# Patient Record
Sex: Male | Born: 1965 | Race: White | Hispanic: No | Smoking: Former smoker
Health system: Southern US, Community
[De-identification: ages and names within clinical notes are randomized; demographics above are authoritative.]

## PROBLEM LIST (undated history)

## (undated) DIAGNOSIS — T1491XA Suicide attempt, initial encounter: Secondary | ICD-10-CM

## (undated) DIAGNOSIS — F250 Schizoaffective disorder, bipolar type: Secondary | ICD-10-CM

## (undated) DIAGNOSIS — F329 Major depressive disorder, single episode, unspecified: Secondary | ICD-10-CM

## (undated) DIAGNOSIS — F1424 Cocaine dependence with cocaine-induced mood disorder: Secondary | ICD-10-CM

## (undated) DIAGNOSIS — F332 Major depressive disorder, recurrent severe without psychotic features: Secondary | ICD-10-CM

## (undated) DIAGNOSIS — F32A Depression, unspecified: Secondary | ICD-10-CM

---

## 2006-06-01 ENCOUNTER — Emergency Department (HOSPITAL_COMMUNITY): Admission: EM | Admit: 2006-06-01 | Discharge: 2006-06-01 | Payer: Self-pay | Admitting: Emergency Medicine

## 2006-06-01 ENCOUNTER — Ambulatory Visit: Payer: Self-pay | Admitting: Psychiatry

## 2006-06-01 ENCOUNTER — Inpatient Hospital Stay (HOSPITAL_COMMUNITY): Admission: AD | Admit: 2006-06-01 | Discharge: 2006-06-03 | Payer: Self-pay | Admitting: Psychiatry

## 2006-08-30 ENCOUNTER — Inpatient Hospital Stay (HOSPITAL_COMMUNITY): Admission: AD | Admit: 2006-08-30 | Discharge: 2006-09-04 | Payer: Self-pay | Admitting: Psychiatry

## 2006-08-30 ENCOUNTER — Ambulatory Visit: Payer: Self-pay | Admitting: Psychiatry

## 2006-10-28 ENCOUNTER — Ambulatory Visit: Payer: Self-pay | Admitting: Psychiatry

## 2006-10-29 ENCOUNTER — Inpatient Hospital Stay (HOSPITAL_COMMUNITY): Admission: RE | Admit: 2006-10-29 | Discharge: 2006-11-01 | Payer: Self-pay | Admitting: Psychiatry

## 2006-12-03 ENCOUNTER — Ambulatory Visit: Payer: Self-pay | Admitting: Psychiatry

## 2006-12-04 ENCOUNTER — Inpatient Hospital Stay (HOSPITAL_COMMUNITY): Admission: RE | Admit: 2006-12-04 | Discharge: 2006-12-06 | Payer: Self-pay | Admitting: Psychiatry

## 2007-04-05 ENCOUNTER — Ambulatory Visit: Payer: Self-pay | Admitting: *Deleted

## 2007-04-05 ENCOUNTER — Inpatient Hospital Stay (HOSPITAL_COMMUNITY): Admission: AD | Admit: 2007-04-05 | Discharge: 2007-04-06 | Payer: Self-pay | Admitting: *Deleted

## 2007-09-26 ENCOUNTER — Inpatient Hospital Stay (HOSPITAL_COMMUNITY): Admission: AD | Admit: 2007-09-26 | Discharge: 2007-09-30 | Payer: Self-pay | Admitting: *Deleted

## 2007-09-26 ENCOUNTER — Ambulatory Visit: Payer: Self-pay | Admitting: *Deleted

## 2008-01-30 ENCOUNTER — Emergency Department (HOSPITAL_COMMUNITY): Admission: EM | Admit: 2008-01-30 | Discharge: 2008-01-30 | Payer: Self-pay | Admitting: Emergency Medicine

## 2008-03-04 ENCOUNTER — Emergency Department (HOSPITAL_COMMUNITY): Admission: AC | Admit: 2008-03-04 | Discharge: 2008-03-04 | Payer: Self-pay

## 2008-03-05 ENCOUNTER — Ambulatory Visit: Payer: Self-pay | Admitting: Psychiatry

## 2008-03-05 ENCOUNTER — Inpatient Hospital Stay (HOSPITAL_COMMUNITY): Admission: AD | Admit: 2008-03-05 | Discharge: 2008-03-07 | Payer: Self-pay | Admitting: Psychiatry

## 2008-03-09 ENCOUNTER — Emergency Department (HOSPITAL_COMMUNITY): Admission: EM | Admit: 2008-03-09 | Discharge: 2008-03-09 | Payer: Self-pay | Admitting: Emergency Medicine

## 2008-11-14 ENCOUNTER — Emergency Department (HOSPITAL_COMMUNITY): Admission: EM | Admit: 2008-11-14 | Discharge: 2008-11-14 | Payer: Self-pay | Admitting: Emergency Medicine

## 2009-01-16 ENCOUNTER — Ambulatory Visit: Payer: Self-pay | Admitting: *Deleted

## 2009-01-16 ENCOUNTER — Emergency Department (HOSPITAL_COMMUNITY): Admission: EM | Admit: 2009-01-16 | Discharge: 2009-01-17 | Payer: Self-pay | Admitting: Emergency Medicine

## 2009-01-17 ENCOUNTER — Inpatient Hospital Stay (HOSPITAL_COMMUNITY): Admission: RE | Admit: 2009-01-17 | Discharge: 2009-01-21 | Payer: Self-pay | Admitting: *Deleted

## 2009-08-14 ENCOUNTER — Emergency Department (HOSPITAL_COMMUNITY): Admission: EM | Admit: 2009-08-14 | Discharge: 2009-08-14 | Payer: Self-pay | Admitting: Emergency Medicine

## 2009-08-22 ENCOUNTER — Emergency Department (HOSPITAL_COMMUNITY): Admission: EM | Admit: 2009-08-22 | Discharge: 2009-08-22 | Payer: Self-pay | Admitting: Emergency Medicine

## 2009-08-28 ENCOUNTER — Emergency Department (HOSPITAL_COMMUNITY): Admission: EM | Admit: 2009-08-28 | Discharge: 2009-08-29 | Payer: Self-pay | Admitting: Emergency Medicine

## 2009-08-29 ENCOUNTER — Ambulatory Visit: Payer: Self-pay | Admitting: Psychiatry

## 2009-08-29 ENCOUNTER — Inpatient Hospital Stay (HOSPITAL_COMMUNITY): Admission: RE | Admit: 2009-08-29 | Discharge: 2009-08-31 | Payer: Self-pay | Admitting: Psychiatry

## 2010-03-17 ENCOUNTER — Emergency Department (HOSPITAL_COMMUNITY): Admission: EM | Admit: 2010-03-17 | Discharge: 2010-03-17 | Payer: Self-pay | Admitting: Emergency Medicine

## 2010-07-04 IMAGING — CR DG CHEST 2V
2 series · 2 of 2 positions shown · non-contrast
Comparison: None

CLINICAL DATA: Fever, cough

CHEST - 2 VIEW

[w chest pa]
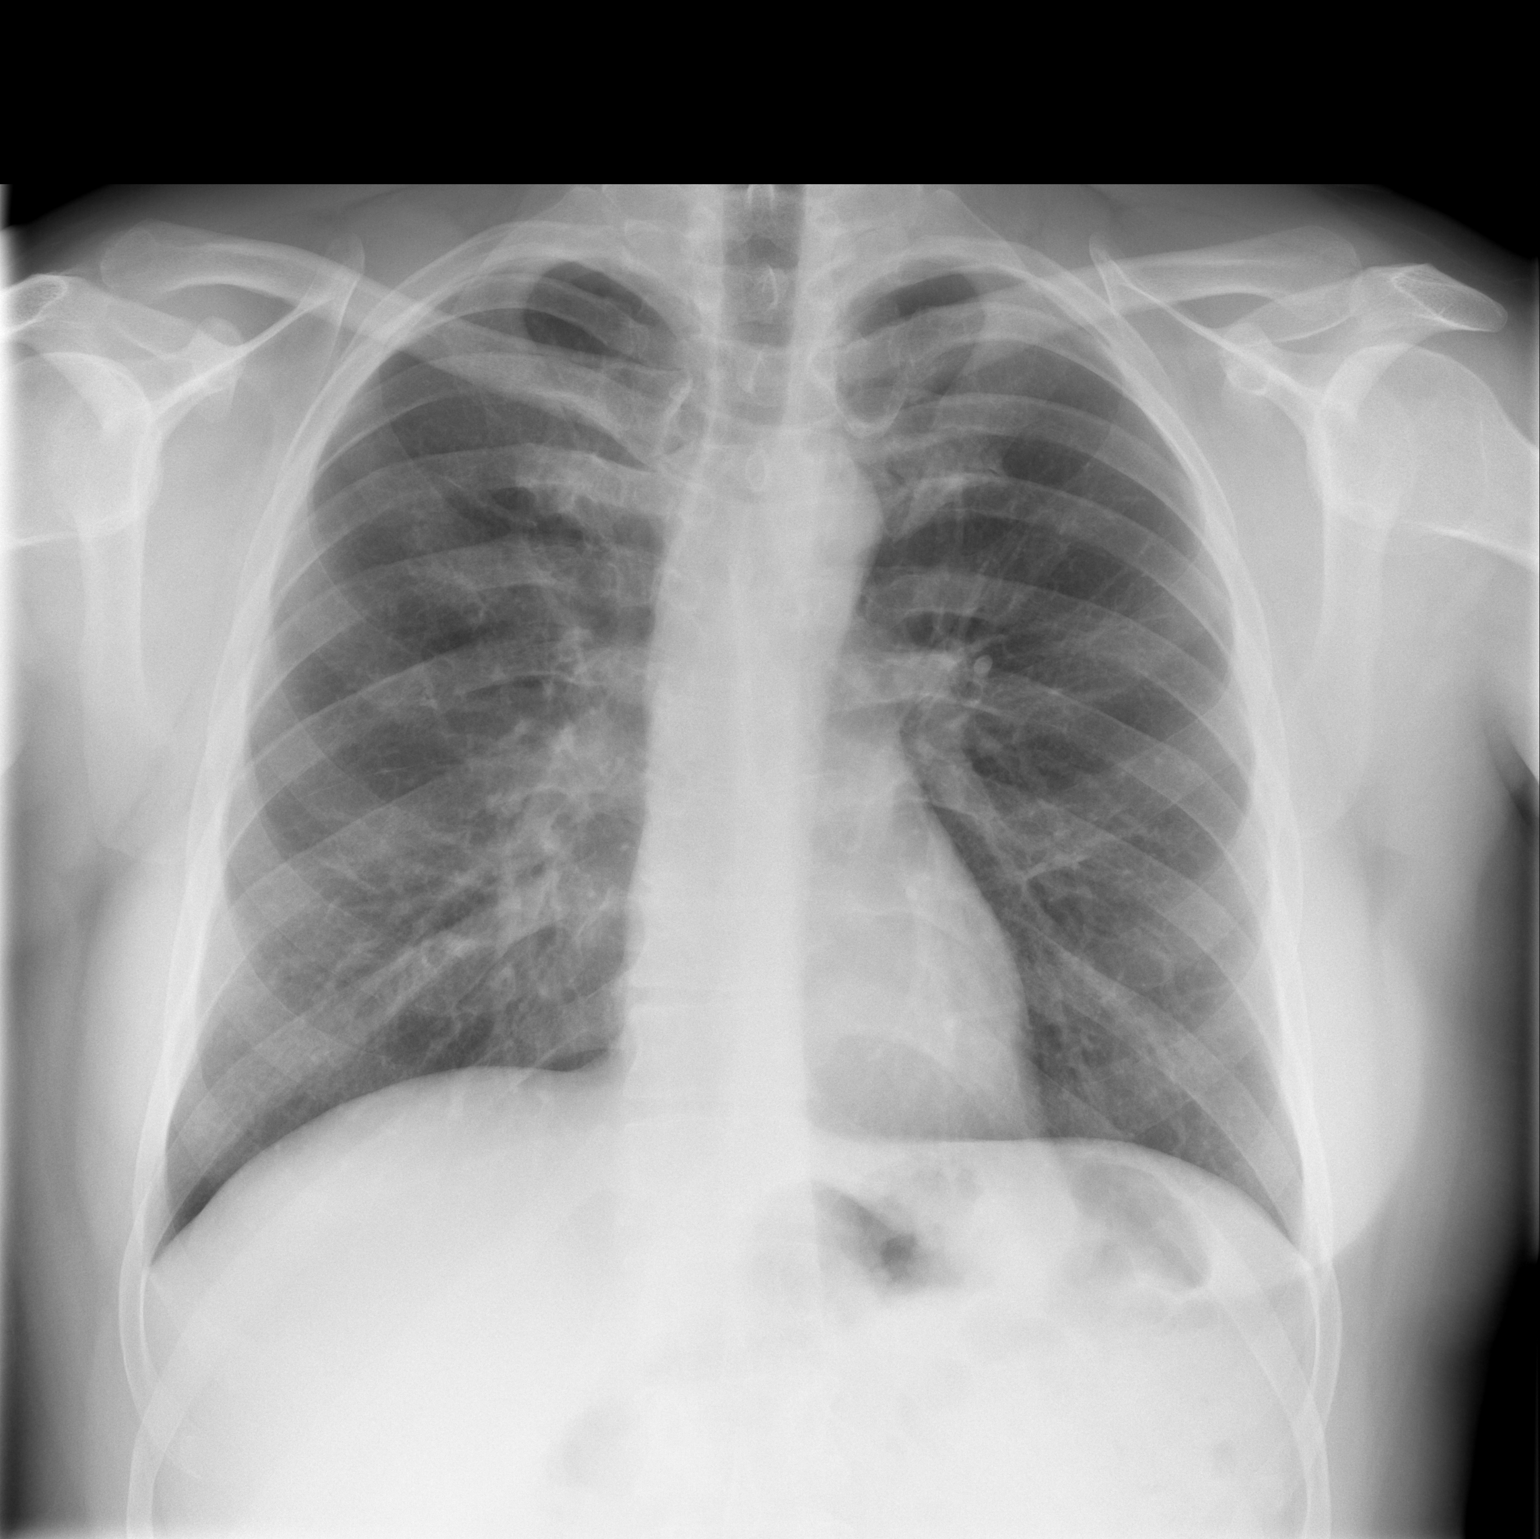

[w chest lat]
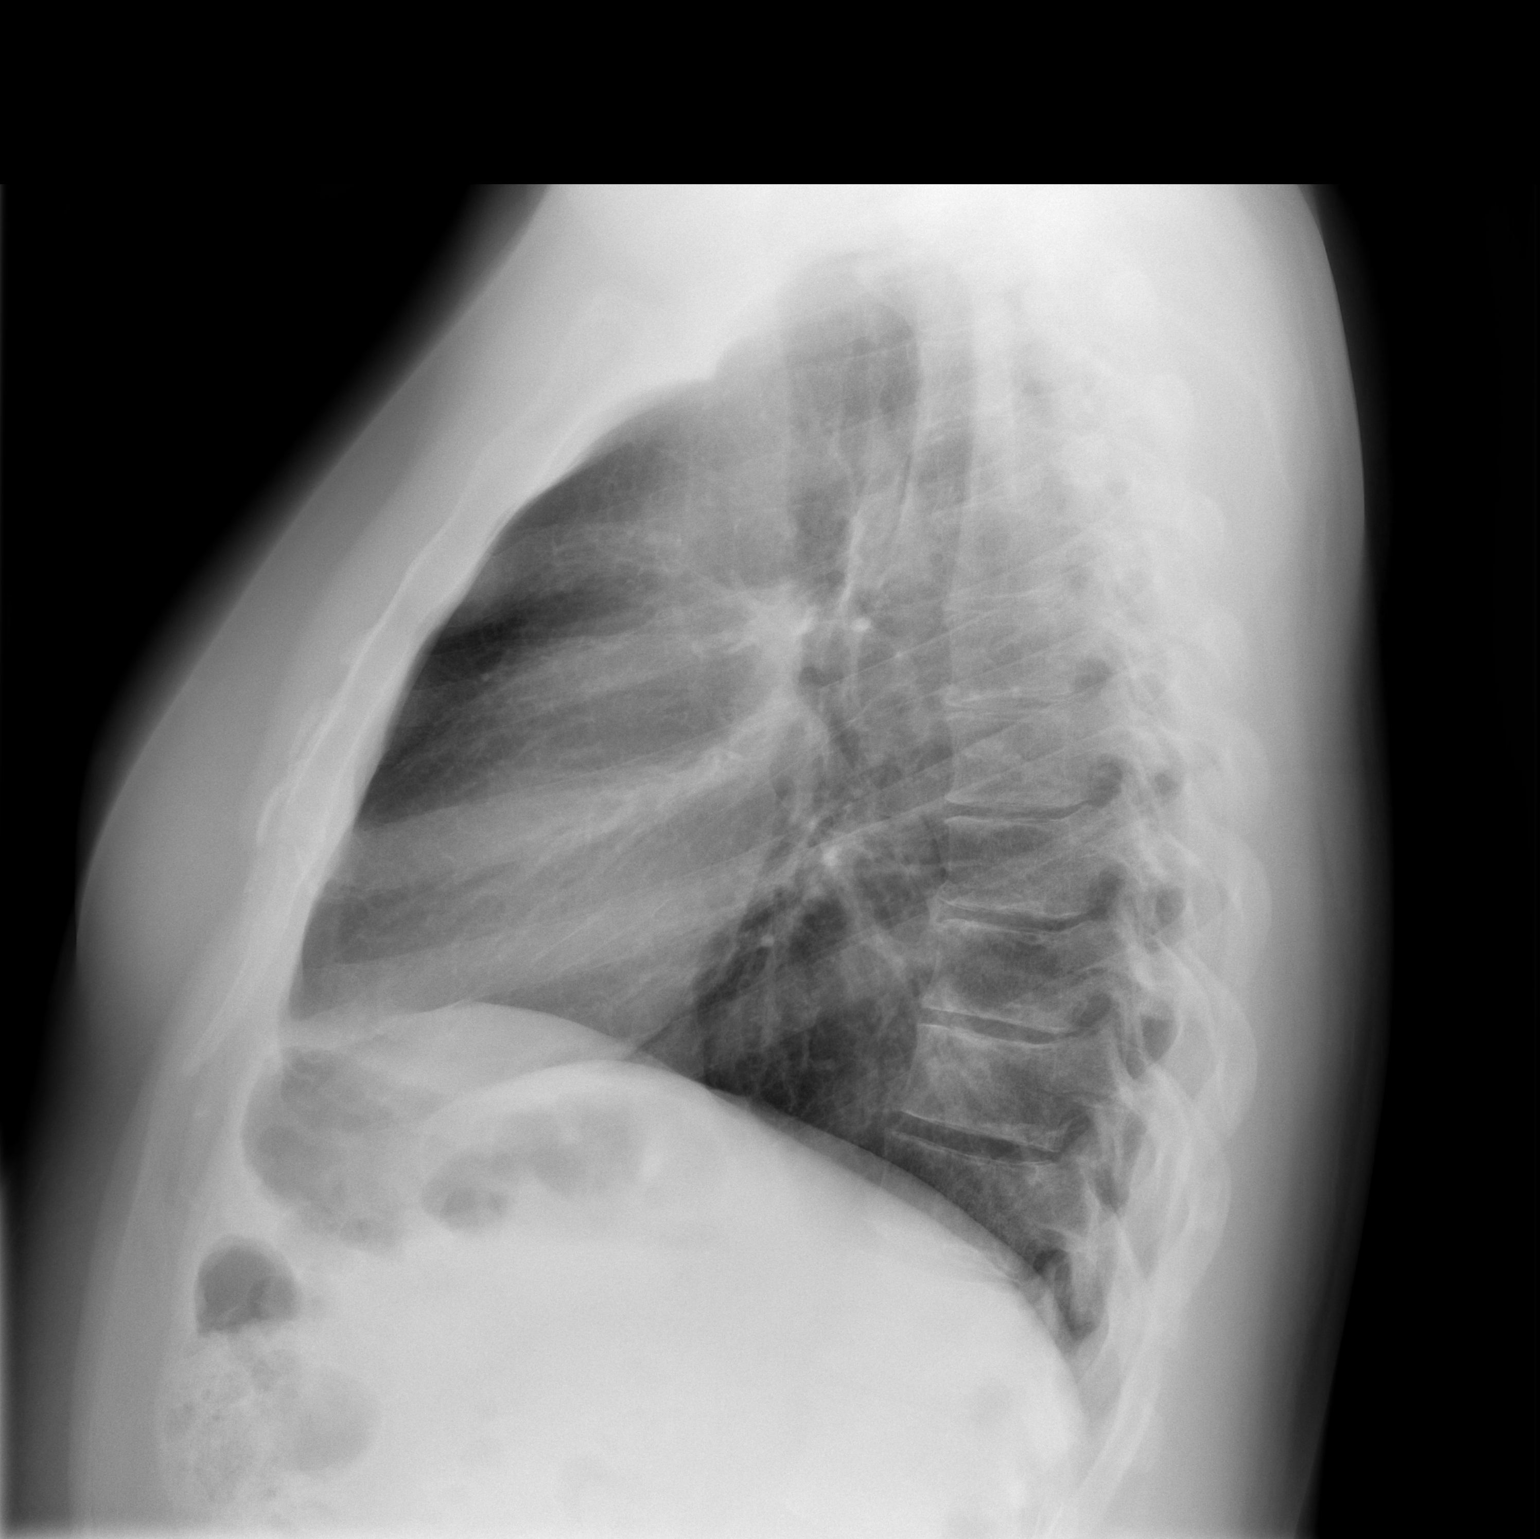

[2 of 2 positions shown; findings below may reference images not displayed]

FINDINGS: Heart size normal.  The lungs are clear.  No pleural
effusion.  No acute osseous finding.
IMPRESSION: No acute cardiopulmonary process.

## 2010-08-13 LAB — CBC
HCT: 45.1 % (ref 39.0–52.0)
MCHC: 34.5 g/dL (ref 30.0–36.0)
MCV: 84.4 fL (ref 78.0–100.0)
Platelets: 299 10*3/uL (ref 150–400)
RBC: 5.35 MIL/uL (ref 4.22–5.81)
RDW: 12.8 % (ref 11.5–15.5)
WBC: 13.2 10*3/uL — ABNORMAL HIGH (ref 4.0–10.5)

## 2010-08-13 LAB — BASIC METABOLIC PANEL
BUN: 11 mg/dL (ref 6–23)
Calcium: 9.3 mg/dL (ref 8.4–10.5)
Creatinine, Ser: 1.06 mg/dL (ref 0.4–1.5)
GFR calc non Af Amer: >60 mL/min (ref >60)

## 2010-08-13 LAB — ETHANOL: Alcohol, Ethyl (B): <5 mg/dL (ref 0–10)

## 2010-08-13 LAB — DIFFERENTIAL
Lymphs Abs: 2.7 10*3/uL (ref 0.7–4.0)
Neutro Abs: 9.7 10*3/uL — ABNORMAL HIGH (ref 1.7–7.7)

## 2010-08-13 LAB — RAPID URINE DRUG SCREEN, HOSP PERFORMED
Amphetamines: NOT DETECTED
Barbiturates: POSITIVE — AB
Benzodiazepines: POSITIVE — AB
Cocaine: NOT DETECTED

## 2010-08-30 LAB — DIFFERENTIAL
Basophils Absolute: 0 10*3/uL (ref 0.0–0.1)
Basophils Relative: 0 % (ref 0–1)
Eosinophils Relative: 1 % (ref 0–5)
Lymphocytes Relative: 24 % (ref 12–46)
Lymphs Abs: 2.5 10*3/uL (ref 0.7–4.0)
Monocytes Absolute: 0.7 10*3/uL (ref 0.1–1.0)
Monocytes Relative: 7 % (ref 3–12)
Neutro Abs: 7.3 10*3/uL (ref 1.7–7.7)
Neutrophils Relative %: 68 % (ref 43–77)

## 2010-08-30 LAB — BASIC METABOLIC PANEL
Calcium: 9.2 mg/dL (ref 8.4–10.5)
GFR calc non Af Amer: >60 mL/min (ref >60)
Potassium: 3.6 mEq/L (ref 3.5–5.1)
Sodium: 137 mEq/L (ref 135–145)

## 2010-08-30 LAB — CBC
Hemoglobin: 15.4 g/dL (ref 13.0–17.0)
MCHC: 33.9 g/dL (ref 30.0–36.0)
MCV: 86.6 fL (ref 78.0–100.0)
RDW: 13.3 % (ref 11.5–15.5)
WBC: 10.7 10*3/uL — ABNORMAL HIGH (ref 4.0–10.5)

## 2010-08-30 LAB — HEPATIC FUNCTION PANEL
Bilirubin, Direct: 0.1 mg/dL (ref 0.0–0.3)
Total Bilirubin: 1.1 mg/dL (ref 0.3–1.2)

## 2010-08-30 LAB — ACETAMINOPHEN LEVEL: Acetaminophen (Tylenol), Serum: <10 ug/mL — ABNORMAL LOW (ref 10–30)

## 2010-08-30 LAB — RAPID URINE DRUG SCREEN, HOSP PERFORMED
Amphetamines: NOT DETECTED
Barbiturates: NOT DETECTED
Cocaine: NOT DETECTED

## 2010-09-20 ENCOUNTER — Emergency Department (HOSPITAL_COMMUNITY)
Admission: EM | Admit: 2010-09-20 | Discharge: 2010-09-20 | Disposition: A | Discharge status: Home / Self Care | Payer: Self-pay | Attending: Emergency Medicine | Admitting: Emergency Medicine

## 2010-09-20 ENCOUNTER — Emergency Department (HOSPITAL_COMMUNITY): Payer: Self-pay

## 2010-09-20 DIAGNOSIS — Z8659 Personal history of other mental and behavioral disorders: Secondary | ICD-10-CM | POA: Insufficient documentation

## 2010-09-20 DIAGNOSIS — F319 Bipolar disorder, unspecified: Secondary | ICD-10-CM | POA: Insufficient documentation

## 2010-09-20 DIAGNOSIS — M79609 Pain in unspecified limb: Secondary | ICD-10-CM | POA: Insufficient documentation

## 2010-09-20 DIAGNOSIS — I10 Essential (primary) hypertension: Secondary | ICD-10-CM | POA: Insufficient documentation

## 2010-09-20 DIAGNOSIS — G43909 Migraine, unspecified, not intractable, without status migrainosus: Secondary | ICD-10-CM | POA: Insufficient documentation

## 2010-09-20 LAB — GLUCOSE, CAPILLARY: Glucose-Capillary: 91 mg/dL (ref 70–99)
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
## 2010-10-07 NOTE — H&P (Signed)
NAMECRUE, OTERO NO.:  [id number]   MEDICAL RECORD NO.:  [id number]          PATIENT TYPE:  IPS   LOCATION:  0306                          FACILITY:  BH   PHYSICIAN:  Geoffery Lyons, M.D.      DATE OF BIRTH:  11-27-1965   DATE OF ADMISSION:  09/26/2007  DATE OF DISCHARGE:                       PSYCHIATRIC ADMISSION ASSESSMENT   This is a 45 year old male that was involuntary committed on Sep 26, 2007.   HISTORY OF PRESENT ILLNESS:  The patient is here on petition.  The  papers state that the patient is a flight risk, suicidal and homicidal  towards his wife, wanting to kill her with a shotgun.  The patient  reports some conflict with his wife.  He has recently been using crack  cocaine, noncompliant with his medications.  He denies that he wants to  harm his wife.  He has been sleeping and eating satisfactorily.  He  denies having hallucinations.   PAST PSYCHIATRIC HISTORY:  The patient has been here prior.  He has no  current outpatient mental health treatment.   SOCIAL HISTORY:  He is a 45 year old married male.  He is unemployed.  He states that he does possibly have a job and they had called him to  work while he was being assessed in the emergency department.   FAMILY HISTORY:  None.   ALCOHOL AND DRUG HISTORY:  The patient reports some recent cocaine use.   PRIMARY CARE Ahriana Gunkel:  None.   MEDICAL PROBLEMS:  No known acute or chronic health issues.   MEDICATIONS:  None prior to arrival.   DRUG ALLERGIES:  No known allergies.   PHYSICAL EXAM:  This is a middle-aged male, somewhat overweight, in no  acute distress.  He was fully assessed at [facility].  He  received fluids and EKG and Ativan 2 mg p.o.  Temperature is 97.1, 90  heart rate, 18 respirations, blood pressure is 104/57.  He is 242  pounds, 5 feet 9 inches tall.   MENTAL STATUS EXAM:  This is a fully alert male, cooperative, good eye  contact, casually dressed.  Speech is  clear, normal pace and tone.  The  patient's mood is neutral.  The patient's affect is somewhat flat and  reserved, does not appear intimidating or threatening.  Cognitive  function intact.  Memory appears to be good.  Judgment and insight poor.   AXIS I:  Mood disorder, cocaine abuse.  AXIS II:  Deferred.  AXIS III:  No known medical conditions.  AXIS IV:  Problems primary support group and other psychosocial problems  related to chronic substance use and problems with occupation.  AXIS V:  Current is 40.   PLAN:  Contract for safety.  Stabilize mood thinking.  Will have Ativan  available on a p.r.n. basis for agitation and anxiety.  Address his  substance use.  The patient will be in the red group.  We will contact  his daughter for concerns and wife for returning to living arrangements.  Medication compliance will be reinforced.  Will have trazodone for sleep  and continue to gather more history.  His tentative length of stay at  this time is 3-4 days.      Landry Corporal, N.P.      Geoffery Lyons, M.D.  Electronically Signed    JO/MEDQ  D:  09/30/2007  T:  09/30/2007  Job:  [id number]

## 2010-10-10 NOTE — Discharge Summary (Signed)
NAMEDENIRO, Logan [name]:  [id number]   MEDICAL RECORD [name]:  [id number]          PATIENT TYPE:  IPS   LOCATION:  0507                          FACILITY:  BH   PHYSICIAN:  Geoffery Lyons, M.D.      DATE OF BIRTH:  02-16-1966   DATE OF ADMISSION:  04/05/2007  DATE OF DISCHARGE:  04/06/2007                               DISCHARGE SUMMARY   CHIEF COMPLAINT AND PRESENT ILLNESS:  This was one of several admissions  to [facility] for this 45 year old male that came to the  unit claiming that the depression was overwhelming.  He was experiencing  suicidal thoughts, but he did contract for safety.  He denied homicidal  ideas.  He had been noncompliant with medications.  He had gone to  [facility] requesting help.  Apparently, he and his wife had  split up three days prior to this admission.  He admitted to suicidal  thoughts, had Hudson plans.  Has been taking his medications for two or  three months.  He claimed that he could not afford the medication.   PAST PSYCHIATRIC HISTORY:  Multiple admissions to [facility] in January, April, June and July 2008.  Last admission was June 6  to June the 9, 2008, diagnosed with bipolar, depressed, on Tegretol,  Abilify, Cogentin and trazodone.  He claimed he could not afford the  medication due to the expense.   SOCIAL HISTORY:  He claimed he has not used any substances since last  discharge.   PASTY MEDICAL HISTORY:  Migraines, anti-hypertension.   MEDICATIONS:  He was not taking any medications as he could not afford  them.   PHYSICAL EXAMINATION:  Examination failed to show any acute findings.   LABORATORY WORK:  Sodium 137, potassium 4.0, glucose 92, BUN 13,  creatinine 0.88, SGOT 29, SGPT 45.  CBC:  White blood cells 15.2,  hemoglobin 14.0.   MENTAL STATUS EXAMINATION:  Reveals an alert, cooperative male.  Upon  this evaluation, he was in bed.  He was endorsing Hudson suicidal ideations.  He endorsed that the male that he was in a relationship with is trying  to create conflict with his wife.  He stated that he wanted to be  discharged, that he did not need to be admitted.  He endorsed Hudson  suicidal ideas.  There was Hudson evidence of delusions, Hudson hallucinations.  Cognition, well-preserved.   ADMISSION DIAGNOSES:  AXIS I:  Bipolar disorder depressed.  AXIS II:  Hudson diagnosis.  AXIS III:  Migraines, history of seizures, anti-hypertension.  AXIS IV:  Moderate.  AXIS V:  Upon admission, 60 as of now is 70.   COURSE IN THE HOSPITAL:  The patient was admitted.  We pretty much  validated what he was saying.  His wife was contacted.  He had Hudson  concern about his safety.  He was aware of what was going on.  We felt  comfortable for him to be discharged, so we went ahead and discharged to  outpatient follow-up.  He really did not want  to start any medications  at this particular time, and wanted to work things out with the  [facility], as he would have to work with whatever  medications they offered there.   DISCHARGE DIAGNOSES:  AXIS I:  Bipolar disorder, depressed.  AXIS II:  Hudson diagnosis.  AXIS III:  Migraines and seizure by history.  AXIS IV:  Moderate.  AXIS V:  Upon discharge, 60.   DISCHARGE MEDICATIONS:  Discharged on Hudson medication.   FOLLOWUP:  [facility].      Geoffery Lyons, M.D.  Electronically Signed     IL/MEDQ  D:  04/22/2007  T:  04/22/2007  Job:  [id number]

## 2010-10-10 NOTE — Discharge Summary (Signed)
NAMEDARROL, BRANDENBURG NO.:  [id number]   MEDICAL RECORD NO.:  [id number]          PATIENT TYPE:  IPS   LOCATION:  0604                          FACILITY:  BH   PHYSICIAN:  Anselm Jungling, MD  DATE OF BIRTH:  11/01/1965   DATE OF ADMISSION:  10/29/2006  DATE OF DISCHARGE:  11/01/2006                               DISCHARGE SUMMARY   IDENTIFYING DATA AND REASON FOR ADMISSION:  This was the second [facility]  admission for Logan Hudson, a 45 year old male who returned to [place] due to  suicidal ideation with plan.  He was homeless and had gone to his  girlfriend's home only to get into an argument with the girlfriend's  mother.  He had no means of support.  He was eventually seen in crisis  at the [facility] emergency room where he revealed a suicidal  ideation and recent use of cocaine.  He had been sober from alcohol  abuse for 6 months.  He was then transferred to our facility for further  care.  He came to [place] on a regimen of Tegretol and Abilify.  Please refer  to the admission note for further details pertaining to the symptoms,  circumstances and history that led to his hospitalization.  He was given  an initial Axis I diagnoses of major depressive disorder recurrent, rule  out bipolar disorder, cocaine abuse, and alcohol dependence, in  remission.   MEDICAL AND LABORATORY:  The patient was medically and physically  assessed by the psychiatric nurse practitioner.  He had severe foot  irritation apparently due to having walked great distances without  adequate foot gear.  There was also a question of a fungal infection.  He was treated with clotrimazole cream twice daily to his feet.  There  were no other significant medical issues.   HOSPITAL COURSE:  The patient was admitted to the adult inpatient  psychiatric service.  He presented as a well-nourished, well-developed  male who was pleasant, affable, alert, and fully oriented.  His thoughts  and speech were  normally organized.  There were no signs or symptoms of  psychosis or thought disorder.  His mood appeared neutral, with good  affective range.  He denied suicidal ideation and verbalized a strong  desire for help.   He was involved in the therapeutic milieu including 12-step oriented  groups.   Regarding psychotropic medications, he was continued on Tegretol 400 mg  q.h.s., Abilify 20 mg daily, Cogentin 2 mg daily, and trazodone 200 mg  daily for sleep.   He was a reasonably good participant in the treatment program and worked  closely with Case Management towards a comprehensive aftercare plan.  We  were not able to identify anyone for a family conference.  He appeared  appropriate for discharge on the fifth hospital day.   AFTERCARE:  The patient was to follow up with [facility]  on November 03, 2006.  They were to arrange psychiatric and other  comprehensive care as needed.   DISCHARGE MEDICATIONS:  1. Tegretol 400 mg q.h.s.  2. Abilify 20  mg daily.  3. Clotrimazole cream twice daily to feet.  4. Cogentin 2 mg daily.  5. Trazodone 200 mg q.h.s.   DISCHARGE DIAGNOSES:  AXIS I:  Bipolar disorder, type 2, most recently  depressed.  AXIS II:  Deferred.  AXIS III:  Dermatitis of feet.  AXIS IV:  Stressors severe.  AXIS V:  Global Assessment of Functioning (GAF) on discharge 60.      Anselm Jungling, MD  Electronically Signed     SPB/MEDQ  D:  11/10/2006  T:  11/11/2006  Job:  [id number]
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
## 2010-10-10 NOTE — Discharge Summary (Signed)
NAMEJHAIR, Logan Hudson NO.:  [id number]   MEDICAL RECORD NO.:  [id number]          PATIENT TYPE:  IPS   LOCATION:  0306                          FACILITY:  BH   PHYSICIAN:  [name], M.D. DATE OF BIRTH:  17-Oct-1965   DATE OF ADMISSION:  09/26/2007  DATE OF DISCHARGE:  09/30/2007                               DISCHARGE SUMMARY   IDENTIFICATION:  This is a 45 year old married male who was admitted on  involuntary basis on Sep 26, 2007.   HISTORY OF PRESENT ILLNESS:  The patient is here on petition.  The  papers state that the patient is a slight risk.  He was suicidal and  also homicidal towards his wife.  He wanted to kill her with a shotgun.  The patient reports some conflict with his wife.  He has recently been  using crack cocaine.  He is noncompliant with his medications.  He  denies that he wants to harm his wife.  He has been sleeping and eating  satisfactorily.  He denies having hallucinations.   PAST PSYCHIATRIC HISTORY:  The patient was here in the past.  He has had  no prior outpatient mental health treatment.   FAMILY HISTORY:  None.   ALCOHOL AND DRUG HISTORY:  The patient reports some recent cocaine use.   MEDICAL PROBLEMS:  No acute or chronic health issues.   MEDICATIONS:  None prior to arrival.   DRUG ALLERGIES:  No known drug allergies.   PHYSICAL FINDINGS:  There were no acute physical or medical problems  noted.  His physical exam was within normal limits.  It was done at  [facility] ED.   ADMISSION LABORATORIES:  WBC was 13.  The hemoglobin was 15.3,  hematocrit was 44.5.  CMET was within normal limits.  UDS was positive  for cocaine.  He had a normal ECG.  Acetaminophen level was less than  10.  Salicylate level less than 1.  Alcohol level less than 0.01.   HOSPITAL COURSE:  The patient was started on Seroquel 50 mg p.o. q.4 h.  p.r.n. anxiety and Ativan 1 mg p.o. q.6 h. p.r.n. anxiety or agitation.  On Sep 29, 2007,  Seroquel was increased to 100 mg p.o. q.4 h. p.r.n.  anxiety.  In individual sessions, the patient was reserved, but  cooperative.  He denied he wanted to kill his wife.  He states he made  the story up in order to find a place to stay.  He states his wife notes  that he is not planning to harm her.  He does not see a psychiatrist or  a therapist.  He wants to go to outpatient drug rehab from here.  As  hospitalization progressed, his mental status improved somewhat.  He did  hear that his wife will not have anything to do with him.  He was  depressed about this.  He was planning to look at halfway houses and it  was unclear where he was going to go after the hospitalization.  He  reported improvement.  The case manager spoke with  his wife who was okay  with the patient on discharging home today.  Wife is arranging  transportation for him.  The patient felt relieved by this turn of  events.  On Sep 30, 2007, mental status had improved markedly from  admission status.  The patient was less depressed, less anxious.  There  was no suicidal or homicidal ideation.  No thoughts of self-injurious  behavior.  No auditory or visual hallucinations.  No paranoia or  delusions.  Thoughts were logical and goal-directed.  Thought content,  no predominant theme.  Cognitive was grossly back to baseline.  It was  felt the patient was safe for discharge today.   DISCHARGE DIAGNOSES:  Axis I:  Mood disorder, not otherwise specified.  Cocaine abuse.  Axis II:  None.  Axis III:  No known medical problems.  Axis IV:  Problems with primary support group and other psychosocial  problems related to chronic substance use and problems with occupation.  Axis V:  Global assessment of functioning is 50 on discharge.  GAF was  40 upon admission.  GAF highest past year 45.   DISCHARGE PLAN:  There was no specific activity level or dietary  restrictions.   POSTHOSPITAL CARE PLANS:  The patient will be seen at  [facility] on Oct 03, 2007, at 9:00 a.m.   DISCHARGE MEDICATIONS:  1. Seroquel 100 mg every 4 hours as needed for anxiety.  2. Trazodone 50 mg p.o. q.h.s. if needed for sleep.      [name], M.D.  Electronically Signed     BHS/MEDQ  D:  11/07/2007  T:  11/08/2007  Job:  [id number]

## 2011-02-24 LAB — URINE CULTURE: Colony Count: NO GROWTH

## 2011-02-24 LAB — COMPREHENSIVE METABOLIC PANEL
ALT: 25
Alkaline Phosphatase: 57
BUN: 8
CO2: 26
Calcium: 8.7
GFR calc non Af Amer: >60
Glucose, Bld: 95
Total Protein: 6.4

## 2011-02-24 LAB — DIFFERENTIAL
Basophils Relative: 0
Eosinophils Absolute: 0.1
Monocytes Relative: 6
Neutro Abs: 9.3 — ABNORMAL HIGH
Neutrophils Relative %: 79 — ABNORMAL HIGH

## 2011-02-24 LAB — URINALYSIS, ROUTINE W REFLEX MICROSCOPIC
Hgb urine dipstick: NEGATIVE
Specific Gravity, Urine: 1.029
Urobilinogen, UA: 1
pH: 6

## 2011-02-24 LAB — CBC
HCT: 43.2
Hemoglobin: 14.5
MCHC: 33.5
RBC: 5.06
RDW: 12.8

## 2011-02-24 LAB — LACTIC ACID, PLASMA: Lactic Acid, Venous: 1

## 2011-02-24 LAB — RAPID URINE DRUG SCREEN, HOSP PERFORMED
Barbiturates: NOT DETECTED
Benzodiazepines: NOT DETECTED
Cocaine: NOT DETECTED
Tetrahydrocannabinol: NOT DETECTED

## 2011-02-24 LAB — ACETAMINOPHEN LEVEL: Acetaminophen (Tylenol), Serum: <10 — ABNORMAL LOW

## 2011-05-22 ENCOUNTER — Inpatient Hospital Stay (HOSPITAL_COMMUNITY)
Admission: AD | Admit: 2011-05-22 | Discharge: 2011-05-26 | DRG: 885 | Disposition: A | Discharge status: Home / Self Care | Payer: Self-pay | Source: Ambulatory Visit | Attending: Psychiatry | Admitting: Psychiatry

## 2011-05-22 ENCOUNTER — Other Ambulatory Visit: Payer: Self-pay

## 2011-05-22 ENCOUNTER — Encounter: Payer: Self-pay | Admitting: Emergency Medicine

## 2011-05-22 ENCOUNTER — Emergency Department (HOSPITAL_COMMUNITY)
Admission: EM | Admit: 2011-05-22 | Discharge: 2011-05-22 | Disposition: A | Discharge status: Other Acute Inpatient Hospital | Payer: Self-pay | Attending: Emergency Medicine | Admitting: Emergency Medicine

## 2011-05-22 ENCOUNTER — Encounter (HOSPITAL_COMMUNITY): Payer: Self-pay | Admitting: *Deleted

## 2011-05-22 DIAGNOSIS — Z136 Encounter for screening for cardiovascular disorders: Secondary | ICD-10-CM | POA: Insufficient documentation

## 2011-05-22 DIAGNOSIS — F1410 Cocaine abuse, uncomplicated: Secondary | ICD-10-CM

## 2011-05-22 DIAGNOSIS — F411 Generalized anxiety disorder: Secondary | ICD-10-CM | POA: Insufficient documentation

## 2011-05-22 DIAGNOSIS — F14159 Cocaine abuse with cocaine-induced psychotic disorder, unspecified: Secondary | ICD-10-CM | POA: Diagnosis present

## 2011-05-22 DIAGNOSIS — F1210 Cannabis abuse, uncomplicated: Secondary | ICD-10-CM | POA: Insufficient documentation

## 2011-05-22 DIAGNOSIS — F3130 Bipolar disorder, current episode depressed, mild or moderate severity, unspecified: Principal | ICD-10-CM

## 2011-05-22 DIAGNOSIS — R45851 Suicidal ideations: Secondary | ICD-10-CM

## 2011-05-22 DIAGNOSIS — E860 Dehydration: Secondary | ICD-10-CM

## 2011-05-22 DIAGNOSIS — F1994 Other psychoactive substance use, unspecified with psychoactive substance-induced mood disorder: Secondary | ICD-10-CM

## 2011-05-22 DIAGNOSIS — Z634 Disappearance and death of family member: Secondary | ICD-10-CM

## 2011-05-22 DIAGNOSIS — R5381 Other malaise: Secondary | ICD-10-CM | POA: Insufficient documentation

## 2011-05-22 DIAGNOSIS — N289 Disorder of kidney and ureter, unspecified: Secondary | ICD-10-CM

## 2011-05-22 DIAGNOSIS — Z79899 Other long term (current) drug therapy: Secondary | ICD-10-CM

## 2011-05-22 HISTORY — DX: Major depressive disorder, single episode, unspecified: F32.9

## 2011-05-22 HISTORY — DX: Depression, unspecified: F32.A

## 2011-05-22 LAB — BASIC METABOLIC PANEL
Calcium: 10.2 mg/dL (ref 8.4–10.5)
GFR calc Af Amer: 48 mL/min — ABNORMAL LOW (ref >90)
GFR calc non Af Amer: 42 mL/min — ABNORMAL LOW (ref >90)
Glucose, Bld: 116 mg/dL — ABNORMAL HIGH (ref 70–99)
Potassium: 3.6 mEq/L (ref 3.5–5.1)
Sodium: 141 mEq/L (ref 135–145)

## 2011-05-22 LAB — ETHANOL: Alcohol, Ethyl (B): <11 mg/dL (ref 0–11)

## 2011-05-22 LAB — DIFFERENTIAL
Eosinophils Absolute: 0 10*3/uL (ref 0.0–0.7)
Lymphocytes Relative: 15 % (ref 12–46)
Lymphs Abs: 2.2 10*3/uL (ref 0.7–4.0)
Monocytes Relative: 6 % (ref 3–12)
Neutro Abs: 11.6 10*3/uL — ABNORMAL HIGH (ref 1.7–7.7)
Neutrophils Relative %: 78 % — ABNORMAL HIGH (ref 43–77)

## 2011-05-22 LAB — RAPID URINE DRUG SCREEN, HOSP PERFORMED
Amphetamines: NOT DETECTED
Benzodiazepines: NOT DETECTED
Cocaine: POSITIVE — AB
Opiates: NOT DETECTED
Tetrahydrocannabinol: NOT DETECTED

## 2011-05-22 LAB — CBC
Hemoglobin: 16.5 g/dL (ref 13.0–17.0)
Platelets: 364 10*3/uL (ref 150–400)
RBC: 5.64 MIL/uL (ref 4.22–5.81)
WBC: 14.7 10*3/uL — ABNORMAL HIGH (ref 4.0–10.5)

## 2011-05-22 MED ORDER — ACETAMINOPHEN 325 MG PO TABS: 650.0000 mg | ORAL_TABLET | ORAL | Status: DC | PRN

## 2011-05-22 MED ORDER — ONDANSETRON HCL 4 MG PO TABS
4.0000 mg | ORAL_TABLET | Freq: Three times a day (TID) | ORAL | Status: DC | PRN
  Administered 2011-05-22: 4 mg via ORAL
  Filled 2011-05-22: qty 1

## 2011-05-22 MED ORDER — TRAZODONE HCL 50 MG PO TABS
100.0000 mg | ORAL_TABLET | Freq: Every day | ORAL | Status: DC
  Filled 2011-05-22: qty 2

## 2011-05-22 MED ORDER — ALUM & MAG HYDROXIDE-SIMETH 200-200-20 MG/5ML PO SUSP
30.0000 mL | ORAL | Status: DC | PRN
  Administered 2011-05-22: 30 mL via ORAL

## 2011-05-22 MED ORDER — NAPROXEN 500 MG PO TABS: 500.0000 mg | ORAL_TABLET | Freq: Two times a day (BID) | ORAL | Status: DC | PRN

## 2011-05-22 MED ORDER — DICYCLOMINE HCL 20 MG PO TABS: 20.0000 mg | ORAL_TABLET | ORAL | Status: DC | PRN

## 2011-05-22 MED ORDER — MAGNESIUM HYDROXIDE 400 MG/5ML PO SUSP: 30.0000 mL | Freq: Every day | ORAL | Status: DC | PRN

## 2011-05-22 MED ORDER — LOPERAMIDE HCL 2 MG PO CAPS: 2.0000 mg | ORAL_CAPSULE | ORAL | Status: DC | PRN

## 2011-05-22 MED ORDER — LORAZEPAM 1 MG PO TABS
1.0000 mg | ORAL_TABLET | ORAL | Status: DC | PRN
  Administered 2011-05-22: 1 mg via ORAL
  Filled 2011-05-22: qty 1

## 2011-05-22 MED ORDER — CLONIDINE HCL 0.1 MG PO TABS
0.1000 mg | ORAL_TABLET | Freq: Four times a day (QID) | ORAL | Status: AC
  Administered 2011-05-22 – 2011-05-24 (×4): 0.1 mg via ORAL
  Filled 2011-05-22 (×9): qty 1

## 2011-05-22 MED ORDER — PANTOPRAZOLE SODIUM 40 MG PO TBEC
40.0000 mg | DELAYED_RELEASE_TABLET | Freq: Every day | ORAL | Status: DC
  Administered 2011-05-22: 40 mg via ORAL
  Filled 2011-05-22: qty 1

## 2011-05-22 MED ORDER — AMITRIPTYLINE HCL 50 MG PO TABS
100.0000 mg | ORAL_TABLET | Freq: Every day | ORAL | Status: DC
  Administered 2011-05-22 – 2011-05-25 (×4): 100 mg via ORAL
  Filled 2011-05-22 (×2): qty 1
  Filled 2011-05-22: qty 28
  Filled 2011-05-22: qty 4
  Filled 2011-05-22: qty 28
  Filled 2011-05-22: qty 4
  Filled 2011-05-22 (×3): qty 1

## 2011-05-22 MED ORDER — HYDROXYZINE HCL 25 MG PO TABS: 25.0000 mg | ORAL_TABLET | Freq: Four times a day (QID) | ORAL | Status: DC | PRN

## 2011-05-22 MED ORDER — TRAZODONE HCL 100 MG PO TABS
100.0000 mg | ORAL_TABLET | Freq: Every day | ORAL | Status: DC
  Administered 2011-05-22 – 2011-05-25 (×4): 100 mg via ORAL
  Filled 2011-05-22: qty 1
  Filled 2011-05-22: qty 14
  Filled 2011-05-22 (×4): qty 1
  Filled 2011-05-22: qty 14
  Filled 2011-05-22: qty 1

## 2011-05-22 MED ORDER — CHLORPROMAZINE HCL 50 MG PO TABS
50.0000 mg | ORAL_TABLET | Freq: Three times a day (TID) | ORAL | Status: DC
  Administered 2011-05-23 – 2011-05-26 (×10): 50 mg via ORAL
  Filled 2011-05-22: qty 42
  Filled 2011-05-22: qty 1
  Filled 2011-05-22: qty 42
  Filled 2011-05-22 (×2): qty 1
  Filled 2011-05-22: qty 42
  Filled 2011-05-22: qty 1
  Filled 2011-05-22: qty 42
  Filled 2011-05-22 (×4): qty 1
  Filled 2011-05-22: qty 42
  Filled 2011-05-22 (×2): qty 1
  Filled 2011-05-22: qty 42
  Filled 2011-05-22 (×5): qty 1

## 2011-05-22 MED ORDER — CLONIDINE HCL 0.1 MG PO TABS
0.1000 mg | ORAL_TABLET | ORAL | Status: DC
  Administered 2011-05-25 – 2011-05-26 (×2): 0.1 mg via ORAL
  Filled 2011-05-22 (×4): qty 1

## 2011-05-22 MED ORDER — ONDANSETRON 4 MG PO TBDP: 4.0000 mg | ORAL_TABLET | Freq: Four times a day (QID) | ORAL | Status: DC | PRN

## 2011-05-22 MED ORDER — CHLORPROMAZINE HCL 25 MG PO TABS
50.0000 mg | ORAL_TABLET | Freq: Every day | ORAL | Status: DC
  Administered 2011-05-22: 50 mg via ORAL
  Filled 2011-05-22: qty 2

## 2011-05-22 MED ORDER — IBUPROFEN 400 MG PO TABS: 600.0000 mg | ORAL_TABLET | Freq: Three times a day (TID) | ORAL | Status: DC | PRN

## 2011-05-22 MED ORDER — METHOCARBAMOL 500 MG PO TABS: 500.0000 mg | ORAL_TABLET | Freq: Three times a day (TID) | ORAL | Status: DC | PRN

## 2011-05-22 MED ORDER — ACETAMINOPHEN 325 MG PO TABS: 650.0000 mg | ORAL_TABLET | Freq: Four times a day (QID) | ORAL | Status: DC | PRN

## 2011-05-22 MED ORDER — QUETIAPINE FUMARATE 25 MG PO TABS
50.0000 mg | ORAL_TABLET | Freq: Every day | ORAL | Status: DC
  Filled 2011-05-22: qty 2

## 2011-05-22 MED ORDER — CLONIDINE HCL 0.1 MG PO TABS
0.1000 mg | ORAL_TABLET | Freq: Every day | ORAL | Status: DC
  Filled 2011-05-22: qty 1

## 2011-05-22 NOTE — ED Notes (Signed)
Per Santina Evans with [facility] - pt has been accepted to [facility] by Dr. Allena Katz.  edp and pt notified.

## 2011-05-22 NOTE — ED Notes (Signed)
Pt states he has done cocaine daily for past few months. Since wifes death 2 months ago. States "She came to me last night and told me to get help". States he did not go to work today, has been walking all morning and ended up here at ED for help. Pt states he has not been taking medications and he wants to die.  Pt is in paper scrubs. Belongings logged in with security.

## 2011-05-22 NOTE — Progress Notes (Signed)
Patient ID: Logan Hudson, male   DOB: 1965/06/07, 45 y.o.   MRN: [id number]  Patient new admit this evening. Spoke to patient this evening and no observed active signs of withdrawal. Received medication orders, started on clonidine protocol, standard orders and continuation of at home medications. Patient stated that he was having indigestion this evening and was given maalox. Patient polite and appropriate. Able to contract. Denies SI/HI/AV.

## 2011-05-22 NOTE — Tx Team (Signed)
Initial Interdisciplinary Treatment Plan  PATIENT STRENGTHS: (choose at least two) Ability for insight Average or above average intelligence Communication skills Motivation for treatment/growth Work skills  PATIENT STRESSORS: Loss of wife 2 mos ago Impending homelessness Substance abuse PROBLEM LIST: Problem List/Patient Goals Date to be addressed Date deferred Reason deferred Estimated date of resolution  depression 05/22/11     Suicidal ideation 05/22/11     Substance abuse 05/22/11                                          DISCHARGE CRITERIA:  Ability to meet basic life and health needs Adequate post-discharge living arrangements Improved stabilization in mood, thinking, and/or behavior Need for constant or close observation no longer present Reduction of life-threatening or endangering symptoms to within safe limits Verbal commitment to aftercare and medication compliance Withdrawal symptoms are absent or subacute and managed without 24-hour nursing intervention  PRELIMINARY DISCHARGE PLAN: Outpatient therapy Placement in alternative living arrangements  PATIENT/FAMIILY INVOLVEMENT: This treatment plan has been presented to and reviewed with the patient, Logan Hudson, and/or family member, none  The patient and family have been given the opportunity to ask questions and make suggestions.  Delila Pereyra 05/22/2011, 9:51 PM

## 2011-05-22 NOTE — ED Provider Notes (Signed)
History   This chart was scribed for Joya Gaskins, MD by Charolett Bumpers . The patient was seen in room APA16A/AP[facility]16A and the patient's care was started at 3:40pm.    CSN: [id number]  Arrival date & time 05/22/11  1418   First MD Initiated Contact with Patient 05/22/11 1521      Chief Complaint  Patient presents with  . Suicidal     HPI Logan Hudson is a 45 y.o. male who presents to the Emergency Department complaining of constant, severe suicidal ideations accompanied by anxiety, generalized weakness and delusions for 2 months. The patient denies homicidal ideations, chest pain, abdominal pain, vomiting, and SOB. The patient also stated that his wife died 2 months ago and has been using cocaine in an attempt to kill himself. Patient also stated he uses marijuana, but no other drugs or medications in attempt to commit suicide. His last attempt to stab himself was 2 years ago, and was admitted to [facility] a few months ago.  Course is worsening Improved by - nothing Worsened by - drug use  Past Medical History  Diagnosis Date  . Depression     History reviewed. No pertinent past surgical history.  No family history on file.  History  Substance Use Topics  . Smoking status: Never Smoker   . Smokeless tobacco: Not on file  . Alcohol Use: Yes     occasional     Review of Systems A complete 10 system review of systems was obtained and is otherwise negative except as noted in the HPI and PMH.   Allergies  Review of patient's allergies indicates no known allergies.  Home Medications  No current outpatient prescriptions on file.  BP 122/102  Pulse 128  Temp(Src) 97.5 F (36.4 C) (Oral)  Resp 20  SpO2 96%  Physical Exam CONSTITUTIONAL: Well developed/well nourished HEAD AND FACE: Normocephalic/atraumatic EYES: EOMI/PERRL ENMT: Mucous membranes moist NECK: supple no meningeal signs SPINE:entire spine nontender CV: S1/S2 noted, no  murmurs/rubs/gallops noted LUNGS: Lungs are clear to auscultation bilaterally, no apparent distress ABDOMEN: soft, nontender, no rebound or guarding NEURO: Pt is awake/alert, moves all extremitiesx4 EXTREMITIES: pulses normal, full ROM SKIN: warm, color normal PSYCH: depressed   ED Course  Procedures   DIAGNOSTIC STUDIES: Oxygen Saturation is 96% on room air, adequate by my interpretation.    COORDINATION OF CARE:    Labs Reviewed  BASIC METABOLIC PANEL  CBC  DIFFERENTIAL  ETHANOL  URINE RAPID DRUG SCREEN (HOSP PERFORMED)     4:12 PM D/w ACT to see patient  4:38 PM Creatinine increased, but otherwise stable but can have this followed up by his PCP Medically cleared at this time  MDM  Nursing notes reviewed and considered in documentation All labs/vitals reviewed and considered     Date: 05/22/2011  Rate: 100  Rhythm: normal sinus rhythm  QRS Axis: normal  Intervals: normal  ST/T Wave abnormalities: nonspecific ST changes  Conduction Disutrbances:none  Narrative Interpretation:   Old EKG Reviewed: unchanged    I personally performed the services described in this documentation, which was scribed in my presence. The recorded information has been reviewed and considered.        Joya Gaskins, MD 05/22/11 [phone]

## 2011-05-22 NOTE — ED Notes (Signed)
[facility] called for transport to [facility] 

## 2011-05-22 NOTE — Progress Notes (Signed)
Patient ID: Logan Hudson, male   DOB: 05-Jan-1966, 45 y.o.   MRN: [id number] Pt. Is a 45 year old male who has chief c/o passive suicidal thoughts (contracts for safety) and substance abuse issues with primary use of cocaine daily.  Pt. Has sold all belongings and is ready to foreclose on house.  Pt. Attempted to stab self with knife until wife ( who passed away 2 mos ago from stroke) "came to him and told him to get help". Pt. Reports no other hallucinatory or delusional experience. Pt. Reports physical pain at "6" from fall off ladder yesterday". Pt. Hurt neck and states it feels "jammed".   Was cooperative with admission.  No significant med. History reported and no report of allergies. Pt is motivated for treatment.. Pt. Is place on q 15 min observations for safety and is safe at this time.

## 2011-05-22 NOTE — BH Assessment (Signed)
Assessment Note   Logan Hudson is an 45 y.o. male. PT PRESENTS WITH INCREASE DEPRESSION & SUICIDAL THOUGHTS WITH A PLAN TO STAB SELF WITH A STEAK KNIFE. PT STATES ITS THE HOLIDAYS, HE WAS FEELING ALONE & HAD BEEN ABUSING CRACK COCAINE WHEN SHE STARTED SEEING & TALKING TO WIFE WHO ENCOURAGED HIM TO GET HELP. PT SAYS HE HE WIFE PASSED AWAY 2 MONTHS AGO & HE HAD BEEN SOBER FOR 6 MONTHS BUT DUE TO DEPRESSION HE RELAPSED ON CRACK COCAINE. PT IS UNABLE TO CONTRACT FOR SAFETY CLAIMING IF HE WENT HOME HE WOULD GO THROUGH WITH HIS SUICIDAL PLAN. PT ADMITS NOT BEING COMPLIANT WITH MEDS  X 2 MONTHS & NOT BEEN FOLLOWING UP WITH OUTPT PROVIDER. PT ALSO REQUESTED HE GET INTO A REHAB PROGRAM TO DEAL WITH HIS CRACK COCAINE HABIT. PT ADMITS USING A MINIUM OF $100 + OF CRACK COCAINE DAILY FOR 3 DAYS. PT ADMITS ALSO USING THC OCCASSIONALLY & ETOH OCCASIONALLY. PT HAS BEEN REFERRED [facility] WHICH HE IS PENDING DISPOSITION.  Axis I: Bipolar, mixed, Substance Abuse and Substance Induced Mood Disorder Axis II: Deferred Axis III:  Past Medical History  Diagnosis Date  . Depression    Axis IV: problems related to social environment and GRIEF Axis V: 11-20 some danger of hurting self or others possible OR occasionally fails to maintain minimal personal hygiene OR gross impairment in communication  Past Medical History:  Past Medical History  Diagnosis Date  . Depression     History reviewed. No pertinent past surgical history.  Family History: No family history on file.  Social History:  reports that he has never smoked. He does not have any smokeless tobacco history on file. He reports that he drinks alcohol. He reports that he uses illicit drugs (Cocaine and Marijuana).  Additional Social History:    Allergies: No Known Allergies  Home Medications:  Medications Prior to Admission  Medication Dose Route Frequency Provider Last Rate Last Dose  . acetaminophen (TYLENOL) tablet 650 mg  650 mg Oral  Q4H PRN Joya Gaskins, MD      . chlorproMAZINE (THORAZINE) tablet 50 mg  50 mg Oral Daily Joya Gaskins, MD      . ibuprofen (ADVIL,MOTRIN) tablet 600 mg  600 mg Oral Q8H PRN Joya Gaskins, MD      . LORazepam (ATIVAN) tablet 1 mg  1 mg Oral Q2H PRN Joya Gaskins, MD   1 mg at 05/22/11 1600  . ondansetron (ZOFRAN) tablet 4 mg  4 mg Oral Q8H PRN Joya Gaskins, MD      . QUEtiapine (SEROQUEL) tablet 50 mg  50 mg Oral QPC breakfast Joya Gaskins, MD      . traZODone (DESYREL) tablet 100 mg  100 mg Oral QHS Joya Gaskins, MD       No current outpatient prescriptions on file as of 05/22/2011.    OB/GYN Status:  No LMP for male patient.  General Assessment Data Location of Assessment: [facility] ACT Assessment: Yes Living Arrangements: Alone Can pt return to current living arrangement?: Yes Admission Status: Voluntary Is patient capable of signing voluntary admission?: Yes Transfer from: Acute Hospital Referral Source: Self/Family/Friend     Risk to self Suicidal Ideation: Yes-Currently Present Suicidal Intent: Yes-Currently Present Is patient at risk for suicide?: Yes Suicidal Plan?: Yes-Currently Present Specify Current Suicidal Plan: STAB SELF Access to Means: Yes Specify Access to Suicidal Means: STEAK KNIFE What has been your use  of drugs/alcohol within the last 12 months?: STARTED USING CRACK COCAINE AT AGE 73; $100 + DAILY FOR THE PAS T 30 DAYS 7 LAST USE WAS  TODAY 05/22/11 Previous Attempts/Gestures: Yes How many times?: 2  Other Self Harm Risks: NA Triggers for Past Attempts: Anniversary;Other (Comment) (GRIEF, DEPRESSION) Intentional Self Injurious Behavior: None Family Suicide History: Unknown Recent stressful life event(s): Turmoil (Comment) (GRIEF, SA) Persecutory voices/beliefs?: Yes Depression: Yes Depression Symptoms: Loss of interest in usual pleasures;Feeling worthless/self pity;Insomnia Substance abuse history and/or treatment for  substance abuse?: Yes Suicide prevention information given to non-admitted patients: Not applicable  Risk to Others Homicidal Ideation: No Thoughts of Harm to Others: No Current Homicidal Intent: No Current Homicidal Plan: No Access to Homicidal Means: No Identified Victim: NA History of harm to others?: No Assessment of Violence: None Noted Violent Behavior Description: CALM, COOPERATIVE, DEPRESSION Does patient have access to weapons?: Yes (Comment) Criminal Charges Pending?: No Does patient have a court date: No  Psychosis Hallucinations: Auditory;Visual (TELL HIM TO GET HELP) Delusions: Grandiose (SEEING & HEARING DECEASED WIFE TELL HIME TO GET HELP)  Mental Status Report Appear/Hygiene: Improved Eye Contact: Poor Motor Activity: Freedom of movement Speech: Soft Level of Consciousness: Alert Mood: Depressed;Anhedonia;Despair;Sad Affect: Depressed;Sad Anxiety Level: None Thought Processes: Coherent;Relevant Judgement: Impaired Orientation: Person;Place;Time;Situation Obsessive Compulsive Thoughts/Behaviors: None  Cognitive Functioning Concentration: Decreased Memory: Recent Intact;Remote Intact IQ: Average Insight: Poor Impulse Control: Poor Appetite: Poor Weight Loss: 0  Weight Gain: 0  Sleep: Decreased Total Hours of Sleep: 3  Vegetative Symptoms: None  Prior Inpatient Therapy Prior Inpatient Therapy: Yes Prior Therapy Dates: 2012, 2011, 2009 Prior Therapy Facilty/Provider(s): [facility]( SEVERAL ADMITS), [facility](2X), [facility], [facility] (3[facility]) Reason for Treatment: STABILIZATION, MED MANAGEMENT  Prior Outpatient Therapy Prior Outpatient Therapy: Yes Prior Therapy Dates: CURRENT Prior Therapy Facilty/Provider(s): [facility] Reason for Treatment: MED MANAGEMENT, THERAPY            Values / Beliefs Cultural Requests During Hospitalization: None Spiritual Requests During Hospitalization: None        Additional Information 1:1 In Past 12 Months?:  No CIRT Risk: No Elopement Risk: No Does patient have medical clearance?: Yes     Disposition:  Disposition Disposition of Patient: Inpatient treatment program;Referred to [facility], [facility]) Type of inpatient treatment program: Adult  On Site Evaluation by:   Reviewed with Physician:     Waldron Session 05/22/2011 5:20 PM

## 2011-05-22 NOTE — ED Notes (Addendum)
Pt here for depression/si. Pt states he has stabbed himself previously (years ago) and has been using cocaine in an attempt to kill himself as recent as last night. Pt wife passed away x 2 months ago. Pt states he heard his wife voice today telling him to come get help. Pt states he has been off his medication x 2 months (since his wife passed away). Denies hi.

## 2011-05-22 NOTE — ED Notes (Signed)
Dinner tray given

## 2011-05-22 NOTE — ED Notes (Signed)
Pt c/o nausea and "throwing up in his throat."  States normally takes pepcid for relief.  edp notified.

## 2011-05-23 ENCOUNTER — Encounter (HOSPITAL_COMMUNITY): Payer: Self-pay | Admitting: Behavioral Health

## 2011-05-23 DIAGNOSIS — F3130 Bipolar disorder, current episode depressed, mild or moderate severity, unspecified: Principal | ICD-10-CM

## 2011-05-23 DIAGNOSIS — F14159 Cocaine abuse with cocaine-induced psychotic disorder, unspecified: Secondary | ICD-10-CM | POA: Diagnosis present

## 2011-05-23 DIAGNOSIS — Z634 Disappearance and death of family member: Secondary | ICD-10-CM

## 2011-05-23 DIAGNOSIS — F1410 Cocaine abuse, uncomplicated: Secondary | ICD-10-CM

## 2011-05-23 NOTE — Progress Notes (Signed)
[facility] Group Notes:  (Counselor/Nursing/MHT/Case Management/Adjunct)  05/23/2011 4:57 PM  Type of Therapy:  Discharge Planning  Participation Level:  Did Not Attend  Participation Quality:  Pt did not attend group  Summary of Progress/Problems: Pt did not attend group.  Christen Butter 05/23/2011, 4:57 PM

## 2011-05-23 NOTE — H&P (Signed)
Psychiatric Admission Assessment Adult  Patient Identification:  Logan Hudson Date of Evaluation:  05/23/2011 45yo recently widowed WM History of Present Illness:: His 7th wife expired in her sleep from a stroke 2 mos ago.He started abusing crack and had to deal with his depression. When he had AVH -saw and heard dead wife telling him to get help - he came to the ED and reported SI with a plan to stab himself with a steak knife. As he couldn't contract for safety and was +for Cocaine he was admitted. Today he says that he would like long term SA treatment.     Past Psychiatric History: Says he was first treated for Bipolar at age 42. Has had numerous psych admissions throughout our state and one in [place]. Last psych care was 2 mos ago in [place] went to hospital to have meds renewed.   Substance Abuse History: Uses Cocaine and THC  Social History:    reports that he has never smoked. He does not have any smokeless tobacco history on file. He reports that he drinks alcohol. He reports that he uses illicit drugs (Cocaine and Marijuana). Married 7 times divorced 6 this last marriage was 5 years before she passed.Has his own mobile car washing business.  Family Psych History:His son age 55 is treated for ADHD. Mother and MGM had mood disorder- no formal diagnosis.  Past Medical History:     Past Medical History  Diagnosis Date  . Depression       No past surgical history on file.  Allergies: No Known Allergies  Current Medications:  Prior to Admission medications   Medication Sig Start Date End Date Taking? Authorizing Provider  Acetaminophen (PAIN RELIEVER) 500 MG coapsule Take 2 capsules by mouth daily as needed. For pain    Yes Historical Provider, MD  amitriptyline (ELAVIL) 100 MG tablet Take 100 mg by mouth at bedtime.     Yes Historical Provider, MD  chlorproMAZINE (THORAZINE) 50 MG tablet Take 50 mg by mouth 3 (three) times daily.     Yes Historical Provider, MD  traZODone  (DESYREL) 100 MG tablet Take 100 mg by mouth at bedtime.     Yes Historical Provider, MD  doxylamine, Sleep, (SLEEP AID) 25 MG tablet Take 50 mg by mouth at bedtime as needed. For sleep     Historical Provider, MD    Mental Status Examination/Evaluation: Objective:  Appearance: Fairly Groomed  Psychomotor Activity:  Normal  Eye Contact::  Good  Speech:  Clear and Coherent  Volume:  Normal  Mood:  Better as he has gotten some sleep   Affect:  Congruent  Thought Process:  Clear rational goal oriented -wants long term SA treatment   Orientation:  Full  Thought Content:  Had AVH prior to admission -saw and heard dead wife   Suicidal Thoughts:  No  Homicidal Thoughts:  No  Judgement:  Intact  Insight:  Good    DIAGNOSIS:    AXIS I Bereavement Cocaine abuse Bipolar -MRE depressed  AXIS II Deferred  AXIS III See medical history.  AXIS IV economic problems and problems with primary support group  AXIS V 51-60 moderate symptoms     Treatment Plan Summary: Admit for safety and stabilization. Continue routine meds -racing thoughts have stopeed and denies AVH SI at this time. Counselor/case manager to help identify futher long term care after discharge. Agree with H&P done in ED -no additional findings.

## 2011-05-23 NOTE — Progress Notes (Signed)
[facility] Group Notes:  (Counselor/Nursing/MHT/Case Management/Adjunct)  05/23/2011 5:49 PM  Type of Therapy:  Group Therapy  Participation Level:  Minimal  Participation Quality:  Appropriate  Affect:  Depressed  Cognitive:  Appropriate  Insight:  Limited  Engagement in Group:  Limited  Engagement in Therapy:  Limited  Modes of Intervention:  Clarification, Education, Problem-solving, Socialization and Support   Summary of Progress/Problems:  Pt participated in group by listening and answering questions appropriately.  Pt disclosed pertinent issues and expressed feelings.  Pt disclosed that he relapsed.   Intervention effective.    Christen Butter 05/23/2011, 5:49 PM

## 2011-05-23 NOTE — Progress Notes (Signed)
   Demographic factors:  Assessment Details Time of Assessment: Admission Current Mental Status:  Current Mental Status: Suicidal ideation indicated by patient Loss Factors:  Loss Factors: Loss of significant relationship (wife died 2 months ago) Historical Factors:    Risk Reduction Factors:     CLINICAL FACTORS:   Depression:   Anhedonia Comorbid alcohol abuse/dependence Hopelessness Impulsivity Insomnia Alcohol/Substance Abuse/Dependencies  COGNITIVE FEATURES THAT CONTRIBUTE TO RISK:  Closed-mindedness Loss of executive function Polarized thinking    SUICIDE RISK:   Mild:  Suicidal ideation of limited frequency, intensity, duration, and specificity.  There are no identifiable plans, no associated intent, mild dysphoria and related symptoms, good self-control (both objective and subjective assessment), few other risk factors, and identifiable protective factors, including available and accessible social support.  Mental Status: General Appearance Logan Hudson:  Casual Eye Contact:  Fair Motor Behavior:  Psychomotor Retardation Speech:  Normal Level of Consciousness:  Alert Mood:  Anxious and Depressed Affect:  Constricted Anxiety Level:  Minimal Thought Process:  Coherent and Relevant Thought Content:  WNL Perception:  Normal Judgment:  Poor Insight:  Present Cognition:  Orientation time, place and person Sleep:  Sleep Number of Hours: 6.25   PLAN OF CARE:  Reviewed chart and H&P Case discussed with [name] Agree with plan of care for detox and long term treatment facility to prevent substance abuse    Logan Hudson,Logan R. 05/23/2011, 1:54 PM

## 2011-05-23 NOTE — Progress Notes (Signed)
Patient ID: Logan Hudson, male   DOB: 26-Jul-1965, 45 y.o.   MRN: [id number] Pt to breakfast this am and up to med window. Quiet,dissheveled,and reporting low energy,fair sleep,and poor ability to pay attention.  Positive for SI in lat 24 hours and contracts for safety x2.  C/O dizziness and blurred vision r/t withdrawal. No am groups this shift. Support and encouragement given. Compliant with scheduled medications and 15' checks cont for safety.

## 2011-05-24 NOTE — Discharge Planning (Signed)
Pt. attended and participated in aftercare planning group. Pt. accepted information on suicide prevention, warning signs to look for with suicide and crisis line numbers to use. The pt. agreed to call crisis line numbers if having warning signs or having thoughts of suicide. Pt stated that he wanted to enroll with [facility] again to assist him in finding housing and treatment.

## 2011-05-24 NOTE — Progress Notes (Signed)
Patient ID: Logan Hudson, male   DOB: 02-11-1966, 45 y.o.   MRN: [id number] Pt. Lying down, refuses group at this time. Pt. Reports depression related to wife's demise. Pt.does not attend group, says maybe tomorrow. Staff will continue to monitor q76min for safety.

## 2011-05-24 NOTE — Discharge Planning (Signed)
Pt. attended and participated in aftercare planning group. Pt. accepted information on suicide prevention, warning signs to look for with suicide and crisis line numbers to use. The pt. agreed to call crisis line numbers if having warning signs or having thoughts of suicide. Pt. Reported he had no support system currently.  He disclosed that his depression worsened after his wife died 2 mos. ago and he became suicidal.  He lost his apt and has no support.  He requested case manager contact [facility] for assistance in housing and treatment.

## 2011-05-24 NOTE — Progress Notes (Signed)
05/24/2011 Logan Hudson is sleeping soundly in his bed upon my attempt to evaluate him today. His chart is reviewed and notes are read.    Pertinent labs: EKG normal                         GFR 42                          BUN normal                         Creatinine normal  Vital signs are stable but Bp is on low end of normal.  Will continue to monitor and address any health issues as they present. NTMashburn PAC

## 2011-05-24 NOTE — Progress Notes (Signed)
Patient ID: Logan Hudson, male   DOB: August 25, 1965, 45 y.o.   MRN: [id number] Pt interacting more on unit this shift and is attending groups with some participation. Rates depression and hopelessness at 10. C/O blurred vision,lightheadedness, and dizziness r/t withdrawal.  Compliant with scheduled meds. BP's have been too low to support clonidine medications. "Off and on" SI and contracts for safety. Support offered and 15' checks cont for safety.

## 2011-05-24 NOTE — Progress Notes (Signed)
Patient ID: [name], male   DOB: 1965/08/17, 45 y.o.   MRN: [id number] Pt's clonidine held at 10 pm due to low BP. Pt attended group after dinner.  No complaints of pain or discomfort noted.

## 2011-05-24 NOTE — H&P (Signed)
Chart reviewed, case discussed and agree with the treatment plan. 

## 2011-05-24 NOTE — Progress Notes (Signed)
[facility] Group Notes:  (Counselor/Nursing/MHT/Case Management/Adjunct)  05/24/2011 4:32 PM  Type of Therapy:  GROUP THERAPY  Participation Level:  Did Not Attend   Logan Hudson 05/24/2011, 4:32 PM

## 2011-05-25 DIAGNOSIS — F3163 Bipolar disorder, current episode mixed, severe, without psychotic features: Secondary | ICD-10-CM

## 2011-05-25 NOTE — Discharge Planning (Addendum)
Logan Hudson attended AM group, good participation.  States he is here due to relapse as result of wife's death 2 months ago.  Cocaine is what he has been using.  Talks about going to a shelter in [place] and programming at a rehab facility.  Will talk with Fayrene Fearing further about d/c options. This afternoon Logan Hudson states he will be d/cing to [facility] as his boss has a trailer on his property and has offered Logan Hudson the opportunity to stay there.  Possible d/c tomorrow if stable and hopeful with no thoughts of SI.  Logan Hudson said he will check with his boss about picking him up from the hospital.

## 2011-05-25 NOTE — Progress Notes (Signed)
Logan Hudson is a 45 y.o. male [id number] 06-04-1965   Diagnosis:  Axis I: Substance Abuse and Substance Induced Mood Disorder  Vital Signs:Blood pressure 84/52, pulse 84, temperature 97.7 F (36.5 C), temperature source Oral, resp. rate 20.  Subjective/Objective: Logan Hudson is up and dressed today.  He states he feels he is returning to normal.  He says he can think abit clearer now.  He has also received a phone call from his sponsor who states he can stay with him upon his discharge from [facility] as long as he is working the program.  Fayrene Fearing meets with the Treatment team this morning. He states he will have to change over his treatment facility to [facility] in [place] to be able to stay in [facility].  Mental Status: Pt is up and active in the unit milieu. General Appearance: Casual Behavior: Cooperative, guarded, uncooperative Eye Contact:  Good Motor Behavior:  Normal Speech:  Regular rate and rhythm, normal volume, coherent and goal directed. Level of Consciousness:  Alert Mood:  Anxious Affect:  Appropriate Anxiety Level:  Minimal Thought Process:  Linear and organized. Thought Content:  No auditory or visual hallucinations. No evidence of psychosis. Perception:  Normal Judgment:  Good Insight:  Present Cognition:  Orientation time, place and person Sleep:  Number of Hours: 5  Appetite: none, improving, as usual, improved Lab Results:None pending Medications Scheduled:  . amitriptyline  100 mg Oral QHS  . chlorproMAZINE  50 mg Oral TID  . cloNIDine  0.1 mg Oral QID   Followed by  . cloNIDine  0.1 mg Oral BH-qamhs   Followed by  . cloNIDine  0.1 mg Oral QAC breakfast  . traZODone  100 mg Oral QHS  PRN Meds acetaminophen, alum & mag hydroxide-simeth, dicyclomine, hydrOXYzine, loperamide, magnesium hydroxide, methocarbamol, naproxen, ondansetron  Assessment/Plan: Continue current plan of care with no changes at this time.  Anticipated D/C is  Tuesday or Wednesday.  Pt.  Will need follow up appointment at [facility] and sponsor will need to be contacted to verify pt's living arrangements and to make sure of transportation. Rona Ravens. Darling Cieslewicz [facility] 05/25/2011

## 2011-05-25 NOTE — Treatment Plan (Signed)
Interdisciplinary Treatment Plan Update (Adult)  Date: 05/25/2011  Time Reviewed: 10:32 AM   Progress in Treatment: Attending groups: Yes Participating in groups: Yes Taking medication as prescribed: Yes Tolerating medication: Yes   Family/Significant othe contact made:None identified   Patient understands diagnosis:  Yes As evidenced by admitting powerlessness over cocaine and depression due to loss of wife Discussing patient identified problems/goals with staff:  Yes  See below Medical problems stabilized or resolved:  Yes Denies suicidal/homicidal ideation: No  On and off  Issues/concerns per patient self-inventory:  Yes  Lightheaded, dizziness and blurred vision Other:  New problem(s) identified: N/A  Reason for Continuation of Hospitalization: Depression Medication stabilization  Interventions implemented related to continuation of hospitalization: Symptom and medication monitoring  Encourage group attendance and participation  Additional comments:  Estimated length of stay: 2-3 days  Discharge Plan:Return to [place]  Follow up outpt and go to NA/AA mtgs  New goal(s): N/A  Review of initial/current patient goals per problem list:   1.  Goal(s):Get back on psychotropic medications  Met:  Yes  Target date:  As evidenced by:  2.  Goal (s):Get an appointment at [facility] for medication management.  Met:  No  Target date:1/1   As evidenced FA:OZHYQMVHQ appointment  3.  Goal(s):Eliminate SI  Met:  No  Target date:1/1  As evidenced IO:NGEXB feeling hopeful and confirming he has no thoughts of harming himself  4.  Goal(s):  Met:  No  Target date:  As evidenced by:  Attendees: Patient:  Logan Hudson 05/25/2011 10:32 AM  Family:     Physician:  Shelda Jakes 05/25/2011 10:32 AM   Nursing:  Quintella Reichert  05/25/2011 10:32 AM   Case Manager:  Richelle Ito, LCSW 05/25/2011 10:32 AM   Counselor:  Ronda Fairly, LCSWA 05/25/2011 10:32 AM   Other:       Other:     Other:     Other:      Scribe for Treatment Team:   Ida Rogue, 05/25/2011 10:32 AM

## 2011-05-25 NOTE — Progress Notes (Signed)
[facility] Group Notes:  (Counselor/Nursing/MHT/Case Management/Adjunct)  05/25/2011 4:00 PM   Type of Therapy:  Processing Group at 11:00 am  Participation Level:  Did Not Attend   [facility] Group Notes:  (Counselor/Nursing/MHT/Case Management/Adjunct)  05/25/2011 4:00 PM   Type of Therapy:  Counseling Group at 1:15 pm w [facility] Presentation  Participation Level:  Did Not Attend  Ronda Fairly, LCSWA 05/25/2011 4:00 PM

## 2011-05-25 NOTE — Progress Notes (Signed)
Logan Hudson is a 45 y.o. male [id number] Oct 11, 1965   Diagnosis:  Axis I: Bipolar, mixed  Vital Signs:Blood pressure 84/52, pulse 84, temperature 97.7 F (36.5 C), temperature source Oral, resp. rate 20.  Subjective/Objective:  Pt. Notes that he is more alert and able to participate in meetings now. States his blood pressure has been low. States he usually takes trazodone, elavil, thorazine for his usual psychiatric meds.  He now has a back up plan to go to [facility] in [place] since he now has a place to stay as of this morning.  Mental Status: Pt is up and active in the unit milieu. General Appearance: Casual Behavior: Cooperative,  Eye Contact:  Good Motor Behavior:  Normal Speech:  Regular rate and rhythm, normal volume, coherent and goal directed. Level of Consciousness:  Alert Mood:  Dysphoric states he is now able to laugh some. Affect:  Appropriate Anxiety Level:  None Thought Process:  Linear and organized. Thought Content:  No auditory or visual hallucinations. No evidence of psychosis. Perception:  Normal Judgment:  Fair Insight:  Present Cognition:  Orientation time, place and person Sleep:  Number of Hours: 5  Appetite: none, improving, as usual, improved  Lab Results:   Medications Scheduled:     . amitriptyline  100 mg Oral QHS  . chlorproMAZINE  50 mg Oral TID  . cloNIDine  0.1 mg Oral QID   Followed by  . cloNIDine  0.1 mg Oral BH-qamhs   Followed by  . cloNIDine  0.1 mg Oral QAC breakfast  . traZODone  100 mg Oral QHS     PRN Meds acetaminophen, alum & mag hydroxide-simeth, dicyclomine, hydrOXYzine, loperamide, magnesium hydroxide, methocarbamol, naproxen, ondansetron  Assessment/Plan: Continue current plan of care with no changes at this time.  Anticipated D/C is   Logan Hudson [facility] 05/25/2011

## 2011-05-25 NOTE — Progress Notes (Signed)
Patient stated his supervisor can pick him up between 1-2:00 p.m. Tomorrow  if patient is discharged tomorrow.  Please let pt know before 11:00 a.m so pt will be able to call his employer and let him know to pick up pt.

## 2011-05-25 NOTE — Progress Notes (Signed)
Patient ID: Logan Hudson, male   DOB: 07/22/1965, 45 y.o.   MRN: [id number] Patient denied SI while talking to nurse.  Contracts for safety.   Denied HI.  Denied A/V hallucinations.   Denied pain. On self inventory form, patient sleeps poor, has low energy level, improving level.   Rated depression #8, hopelessness #7.  SI off and on.  Contracts for safety with nurse, denied SI with nurse.  Has experienced lightheadedness, dizziness, blurred vision in past 24 hours.  Plans to "try to stay on medications" after discharge.  No questions for staff.  Does not know his discharge plans.  No problems staying on meds after discharge.

## 2011-05-26 DIAGNOSIS — F1994 Other psychoactive substance use, unspecified with psychoactive substance-induced mood disorder: Secondary | ICD-10-CM

## 2011-05-26 MED ORDER — CLONIDINE HCL 0.1 MG PO TABS
0.1000 mg | ORAL_TABLET | Freq: Every day | ORAL | Status: DC
  Filled 2011-05-26: qty 3

## 2011-05-26 MED ORDER — CLONIDINE HCL 0.1 MG PO TABS: ORAL_TABLET | ORAL | Status: DC

## 2011-05-26 NOTE — Discharge Summary (Signed)
  S: Pt reports readiness to be discharged.  Plans to have his boss pick him up today in early afternoon.  Expresses desire to maintain abstinence from substances of abuse.  Plans to attend church and get help from pastor.  Would like a schedule of 12-step meetings in his area.  Understands need for sponsor and support network.  Denies SI/HI or AVH.  Will follow-up at [name].  O: Alert and oriented.  NAD.  Well groomed and casually dressed.  Fair eye contact.  Mildly anxious with questionable insight and judgement.  High risk for relapse due to grieving death of wife two months ago.  A: Acute treatment process complete and pt prepared for discharge to outpatient care.  P: Discharge home today.

## 2011-05-26 NOTE — Progress Notes (Signed)
Suicide Risk Assessment  Discharge Assessment     Demographic factors:  Assessment Details Time of Assessment: Discharge Current Mental Status:  Current Mental Status: Suicidal ideation indicated by patient Risk Reduction Factors:     CLINICAL FACTORS:   Severe Anxiety and/or Agitation Depression:   Comorbid alcohol abuse/dependence Hopelessness Alcohol/Substance Abuse/Dependencies  COGNITIVE FEATURES THAT CONTRIBUTE TO RISK:  Polarized thinking Thought constriction (tunnel vision)    SUICIDE RISK:   Mild:  Suicidal ideation of limited frequency, intensity, duration, and specificity.  There are no identifiable plans, no associated intent, mild dysphoria and related symptoms, good self-control (both objective and subjective assessment), few other risk factors, and identifiable protective factors, including available and accessible social support.  Patient denies suicidal or homicidal ideation, hallucinations, illusions, or delusions. Patient engages with good eye contact, is able to focus adequately in a one to one setting, and has clear goal directed thoughts. Patient speaks with a natural conversational volume, rate, and tone. Anxiety was reported as mild. Depression was reported at 5  on a scale of 1 the least and 10 the most. Judgement: somewhat impaired by his addiction Insight: limited Plan: Remain on . amitriptyline  100 mg Oral QHS  . chlorproMAZINE  50 mg Oral TID  . cloNIDine  0.1 mg Oral QHS  . traZODone  100 mg Oral QHS  Because Elavil is for depression, insomnia, and could be helpful for pain management, Thorazine is helpful for anxiety, Clonidine is for opiate withdrawal symptoms, and Desyrel is for insomnia. Follow-up is with Follow-up Information    Follow up with [facility] on 05/28/2011. (8 AM)    Contact information:   [address]  [place] [address]  [phone]      Follow up with Attend daily mtgs at [facility]  [address] in [place].        This was based on a review of the chart and not on a face to face interview.  Gloristine Turrubiates 05/26/2011, 4:31 PM

## 2011-05-26 NOTE — Progress Notes (Signed)
Pt reports minimal withdrawal symptoms this evening.  He denies SI/HI at this time.  He is med compliant.  He is pleasant/cooperative.  He voices no complaints at this time.  Safety maintained with q15 minute checks.

## 2011-05-26 NOTE — Discharge Summary (Signed)
Pt attended discharge planning group and actively participated.  Pt presents with calm mood and affect.  Pt denies depression, anxiety and SI.  Pt reports feeling stable to d/c today.  Pt states his d/c plan is to follow up at [facility] for medication management and substance abuse groups.  Pt states he will also attend AA meetings.  Pt to return to boss' trailer he is allowing pt to stay at and reports his boss will pick him up upon d/c.  No further d/c needs voiced.  Safety planning and suicide prevention discussed.     Logan Hudson, LCSWA 05/26/2011  10:07 AM

## 2011-05-26 NOTE — Progress Notes (Signed)
[facility] Group Notes:  (Counselor/Nursing/MHT/Case Management/Adjunct)  05/26/2011 12:54 PM  Type of Therapy:  Group Processing at 11:00AM  Participation Level:  Did Not Attend; although discharging later today pt was seen on hall during group time   Clide Dales 05/26/2011, 12:54 PM

## 2011-05-26 NOTE — Progress Notes (Signed)
Patient ID: Logan Hudson, male   DOB: 1965-10-06, 46 y.o.   MRN: [id number] Pt was discharged ambulatory to be picked up by his boss.  He denies SI/HI .  He verbalizes understanding of his D/C meds and followup.  He was given sample meds by MD.  His belongings were returned from his locker.

## 2011-05-26 NOTE — Progress Notes (Signed)
Patient denied SI and HI.   Denied A/V hallucinations.   Denied pain.   Is excited about today's discharge. On patient self inventory sheet, patient sleeps fair, has good appetite, has low energy level, good attention span.  Rated depression #5, hopelessness #4.  Denied SI.  Has experienced in past 24 hours lightheadedness, dizziness, blurred vision.  Pain goal #3.  Plans to stay on his meds after discharge.  No questions for staff.  Unsure of discharge plans.  No problems staying on meds after discharge. Patient has been cooperative and pleasant.

## 2011-05-26 NOTE — Progress Notes (Signed)
[facility] Adult Inpatient Family/Significant Other Suicide Prevention Education  Suicide Prevention Education:  Patient Refusal for Family/Significant Other Suicide Prevention Education: The patient Logan Hudson has refused to provide written consent for family/significant other to be provided Family/Significant Other Suicide Prevention Education during admission and/or prior to discharge.  Physician notified. Writer provided suicide prevention education directly to patient; conversation included risk factors, warning signs and resources to contact for help. Mobile crisis services explained and contact information placed in chart for pt to receive at discharge.   Clide Dales 05/26/2011, 10:07 AM

## 2011-05-27 NOTE — Progress Notes (Signed)
Patient Discharge Instructions:  Admission Note Faxed,  05/27/2011 After Visit Summary Faxed,  05/27/2011 Faxed to the [facility] Care provider:  05/27/2011 D/C Summary faxed 05/27/2011 Facesheet faxed 05/27/2011  Faxed to [facility] @ [phone]  Pleasant Bensinger, Eduard Clos, 05/27/2011, 3:28 PM

## 2011-06-22 NOTE — Discharge Summary (Signed)
Physician Discharge Summary Note  Patient:  Logan Hudson is an 46 y.o., male MRN:  [id number] DOB:  November 22, 1965 Patient phone:  [phone] (home)  Patient address:   [address] [place] [address],   Date of Admission:  05/22/2011 Date of Discharge:  05/26/2011  Discharge Diagnoses: Principal Problem:  *Cocaine abuse without complication Active Problems:  Bereavement   Axis Diagnosis:   AXIS I:  Cocaine abuse, bereavement AXIS II:  Deferred AXIS III:   Past Medical History  Diagnosis Date  . Depression    AXIS IV:  economic problems and problems with primary support group AXIS V:  51-60 moderate symptoms  Level of Care:  OP  Hospital Course:  Unremarkable.  Pt. Was seen by treatment team including MD, PAC, RN, SW, CM and evaluated.  He stated he would like to be discharged to return home with his sponsor who will give him a place to stay and help him to follow up at AA/NA meetings, get him to appointments, and help him get to and from work.  The patient did not want to pursue any further residential treatment programs and asked to be disharged as planned.  Consults:  None  Significant Diagnostic Studies:  None  Discharge Vitals:   Blood pressure 116/75, pulse 92, temperature 97.5 F (36.4 C), temperature source Oral, resp. rate 18.  Mental Status Exam: See Mental Status Examination and Suicide Risk Assessment completed by Attending Physician prior to discharge.  Discharge destination:  Home  Is patient on multiple antipsychotic therapies at discharge:  No   Has Patient had three or more failed trials of antipsychotic monotherapy by history:  No  Recommended Plan for Multiple Antipsychotic Therapies:   Discharge Orders    Future Orders Please Complete By Expires   Diet - low sodium heart healthy      Increase activity slowly        Medication List  As of 06/22/2011 11:52 PM   START taking these medications         cloNIDine 0.1 MG tablet   Commonly  known as: CATAPRES   Take 1 tablet at bedtime 05/26/11 and 1 tablet in the morning 05/27/11 and 05/28/11 then stop to finish detox         CONTINUE taking these medications         amitriptyline 100 MG tablet   Commonly known as: ELAVIL      chlorproMAZINE 50 MG tablet   Commonly known as: THORAZINE      traZODone 100 MG tablet   Commonly known as: DESYREL         STOP taking these medications         PAIN RELIEVER 500 MG coapsule      SLEEP AID 25 MG tablet          Where to get your medications    These are the prescriptions that you need to pick up.   You may get these medications from any pharmacy.         cloNIDine 0.1 MG tablet           Follow-up Information    Follow up with [facility] on 05/28/2011. (8 AM)    Contact information:   [address]  [place] [address]  [phone]      Follow up with Attend daily mtgs at [facility]  [address] in [place].         Follow-up recommendations:  90 meetings in 90 days  Comments:  Keep all apointments as scheduled.  Signed  Lloyd Huger T. Lacoya Wilbanks PAC For Dr. Gurney Maxin 06/22/2011, 11:52 PM

## 2016-03-12 ENCOUNTER — Encounter (HOSPITAL_COMMUNITY): Payer: Self-pay | Admitting: *Deleted

## 2016-03-12 ENCOUNTER — Encounter (HOSPITAL_COMMUNITY): Payer: Self-pay | Admitting: Emergency Medicine

## 2016-03-12 ENCOUNTER — Emergency Department (HOSPITAL_COMMUNITY)
Admission: EM | Admit: 2016-03-12 | Discharge: 2016-03-12 | Disposition: A | Discharge status: Other Acute Inpatient Hospital | Payer: Self-pay | Attending: Emergency Medicine | Admitting: Emergency Medicine

## 2016-03-12 ENCOUNTER — Observation Stay (HOSPITAL_COMMUNITY)
Admission: AD | Admit: 2016-03-12 | Discharge: 2016-03-14 | Disposition: A | Discharge status: Home / Self Care | Payer: Self-pay | Source: Intra-hospital | Attending: Psychiatry | Admitting: Psychiatry

## 2016-03-12 DIAGNOSIS — Z79899 Other long term (current) drug therapy: Secondary | ICD-10-CM | POA: Insufficient documentation

## 2016-03-12 DIAGNOSIS — R45851 Suicidal ideations: Secondary | ICD-10-CM

## 2016-03-12 DIAGNOSIS — I493 Ventricular premature depolarization: Secondary | ICD-10-CM | POA: Insufficient documentation

## 2016-03-12 DIAGNOSIS — F332 Major depressive disorder, recurrent severe without psychotic features: Principal | ICD-10-CM | POA: Diagnosis present

## 2016-03-12 DIAGNOSIS — Z590 Homelessness: Secondary | ICD-10-CM | POA: Insufficient documentation

## 2016-03-12 HISTORY — DX: Major depressive disorder, recurrent severe without psychotic features: F33.2

## 2016-03-12 LAB — COMPREHENSIVE METABOLIC PANEL
ALBUMIN: 4.1 g/dL (ref 3.5–5.0)
ALT: 31 U/L (ref 17–63)
AST: 28 U/L (ref 15–41)
Alkaline Phosphatase: 53 U/L (ref 38–126)
Anion gap: 9 (ref 5–15)
BILIRUBIN TOTAL: 0.8 mg/dL (ref 0.3–1.2)
BUN: 17 mg/dL (ref 6–20)
CHLORIDE: 105 mmol/L (ref 101–111)
CO2: 25 mmol/L (ref 22–32)
CREATININE: 1.21 mg/dL (ref 0.61–1.24)
Calcium: 9.5 mg/dL (ref 8.9–10.3)
GFR calc Af Amer: >60 mL/min (ref >60)
GLUCOSE: 125 mg/dL — AB (ref 65–99)
POTASSIUM: 3.6 mmol/L (ref 3.5–5.1)
Sodium: 139 mmol/L (ref 135–145)
Total Protein: 7.5 g/dL (ref 6.5–8.1)

## 2016-03-12 LAB — CBC
HEMATOCRIT: 43.4 % (ref 39.0–52.0)
Hemoglobin: 14.9 g/dL (ref 13.0–17.0)
MCH: 28.6 pg (ref 26.0–34.0)
MCHC: 34.3 g/dL (ref 30.0–36.0)
MCV: 83.3 fL (ref 78.0–100.0)
PLATELETS: 359 10*3/uL (ref 150–400)
RBC: 5.21 MIL/uL (ref 4.22–5.81)
RDW: 13.1 % (ref 11.5–15.5)
WBC: 9.6 10*3/uL (ref 4.0–10.5)

## 2016-03-12 LAB — SALICYLATE LEVEL: Salicylate Lvl: <7 mg/dL (ref 2.8–30.0)

## 2016-03-12 LAB — RAPID URINE DRUG SCREEN, HOSP PERFORMED
AMPHETAMINES: NOT DETECTED
BARBITURATES: NOT DETECTED
BENZODIAZEPINES: NOT DETECTED
Cocaine: POSITIVE — AB
Opiates: NOT DETECTED
TETRAHYDROCANNABINOL: NOT DETECTED

## 2016-03-12 LAB — ACETAMINOPHEN LEVEL: Acetaminophen (Tylenol), Serum: <10 ug/mL — ABNORMAL LOW (ref 10–30)

## 2016-03-12 LAB — ETHANOL

## 2016-03-12 MED ORDER — ACETAMINOPHEN 325 MG PO TABS
650.0000 mg | ORAL_TABLET | ORAL | Status: DC | PRN
  Administered 2016-03-13: 650 mg via ORAL
  Filled 2016-03-12: qty 2

## 2016-03-12 MED ORDER — ONDANSETRON HCL 4 MG PO TABS: 4.0000 mg | ORAL_TABLET | Freq: Three times a day (TID) | ORAL | Status: DC | PRN

## 2016-03-12 MED ORDER — NICOTINE 21 MG/24HR TD PT24: 21.0000 mg | MEDICATED_PATCH | Freq: Every day | TRANSDERMAL | Status: DC

## 2016-03-12 MED ORDER — ALUM & MAG HYDROXIDE-SIMETH 200-200-20 MG/5ML PO SUSP
30.0000 mL | ORAL | Status: DC | PRN
  Administered 2016-03-12: 30 mL via ORAL
  Filled 2016-03-12: qty 30

## 2016-03-12 MED ORDER — TRAZODONE HCL 50 MG PO TABS
50.0000 mg | ORAL_TABLET | Freq: Every evening | ORAL | Status: DC | PRN
  Administered 2016-03-12: 50 mg via ORAL
  Filled 2016-03-12 (×2): qty 1
  Filled 2016-03-12: qty 7

## 2016-03-12 MED ORDER — LORAZEPAM 1 MG PO TABS: 1.0000 mg | ORAL_TABLET | Freq: Three times a day (TID) | ORAL | Status: DC | PRN

## 2016-03-12 MED ORDER — ACETAMINOPHEN 325 MG PO TABS: 650.0000 mg | ORAL_TABLET | ORAL | Status: DC | PRN

## 2016-03-12 MED ORDER — ACETAMINOPHEN 325 MG PO TABS: 650.0000 mg | ORAL_TABLET | Freq: Four times a day (QID) | ORAL | Status: DC | PRN

## 2016-03-12 MED ORDER — IBUPROFEN 400 MG PO TABS: 600.0000 mg | ORAL_TABLET | Freq: Three times a day (TID) | ORAL | Status: DC | PRN

## 2016-03-12 MED ORDER — NYSTATIN 100000 UNIT/GM EX CREA
TOPICAL_CREAM | Freq: Two times a day (BID) | CUTANEOUS | Status: DC
  Administered 2016-03-12: 21:00:00 via TOPICAL
  Filled 2016-03-12: qty 15

## 2016-03-12 MED ORDER — ALUM & MAG HYDROXIDE-SIMETH 200-200-20 MG/5ML PO SUSP: 30.0000 mL | ORAL | Status: DC | PRN

## 2016-03-12 MED ORDER — BUSPIRONE HCL 15 MG PO TABS
15.0000 mg | ORAL_TABLET | Freq: Two times a day (BID) | ORAL | Status: DC
  Administered 2016-03-12 – 2016-03-14 (×4): 15 mg via ORAL
  Filled 2016-03-12: qty 14
  Filled 2016-03-12 (×4): qty 1

## 2016-03-12 MED ORDER — HYDROXYZINE HCL 25 MG PO TABS
25.0000 mg | ORAL_TABLET | Freq: Four times a day (QID) | ORAL | Status: DC | PRN
  Administered 2016-03-12: 25 mg via ORAL
  Filled 2016-03-12: qty 1

## 2016-03-12 MED ORDER — MAGNESIUM HYDROXIDE 400 MG/5ML PO SUSP: 30.0000 mL | Freq: Every day | ORAL | Status: DC | PRN

## 2016-03-12 MED ORDER — ZOLPIDEM TARTRATE 5 MG PO TABS: 5.0000 mg | ORAL_TABLET | Freq: Every evening | ORAL | Status: DC | PRN

## 2016-03-12 MED ORDER — QUETIAPINE FUMARATE ER 50 MG PO TB24
100.0000 mg | ORAL_TABLET | Freq: Every day | ORAL | Status: DC
  Administered 2016-03-13: 100 mg via ORAL
  Filled 2016-03-12: qty 2

## 2016-03-12 MED ORDER — NICOTINE 21 MG/24HR TD PT24
21.0000 mg | MEDICATED_PATCH | Freq: Every day | TRANSDERMAL | Status: DC
  Filled 2016-03-12 (×2): qty 1

## 2016-03-12 MED ORDER — NYSTATIN 100000 UNIT/GM EX CREA
TOPICAL_CREAM | Freq: Two times a day (BID) | CUTANEOUS | Status: DC
  Filled 2016-03-12: qty 15

## 2016-03-12 MED ORDER — QUETIAPINE FUMARATE 50 MG PO TABS
150.0000 mg | ORAL_TABLET | Freq: Every day | ORAL | Status: DC
  Administered 2016-03-12 – 2016-03-13 (×2): 150 mg via ORAL
  Filled 2016-03-12 (×2): qty 1

## 2016-03-12 MED ORDER — IBUPROFEN 600 MG PO TABS
600.0000 mg | ORAL_TABLET | Freq: Three times a day (TID) | ORAL | Status: DC | PRN
Start: 2016-03-12 — End: 2016-03-14

## 2016-03-12 NOTE — ED Provider Notes (Signed)
Patient has been accepted at [facility] by Dr. Lucianne MussKumar.    Dione Boozeavid Kymere Fullington, MD 03/12/16 [phone]

## 2016-03-12 NOTE — ED Notes (Signed)
Hamburger with fries delivered to patient.
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
## 2016-03-12 NOTE — ED Notes (Signed)
Staffing office called for sitter. 

## 2016-03-12 NOTE — ED Notes (Signed)
Pt. Given meal and drink with EDP approval.  

## 2016-03-12 NOTE — ED Notes (Signed)
A regular diet was ordered for patient. 

## 2016-03-12 NOTE — ED Notes (Signed)
[facility] transport contacted  

## 2016-03-12 NOTE — ED Notes (Addendum)
Requesting meds for heartburn

## 2016-03-12 NOTE — ED Notes (Signed)
Pt to [facility] and belongings we[facility] transport. Steady gait.
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
## 2016-03-13 DIAGNOSIS — R45851 Suicidal ideations: Secondary | ICD-10-CM

## 2016-03-13 DIAGNOSIS — Z87891 Personal history of nicotine dependence: Secondary | ICD-10-CM

## 2016-03-13 DIAGNOSIS — F332 Major depressive disorder, recurrent severe without psychotic features: Secondary | ICD-10-CM

## 2016-03-13 DIAGNOSIS — Z79899 Other long term (current) drug therapy: Secondary | ICD-10-CM

## 2016-03-13 MED ORDER — QUETIAPINE FUMARATE 50 MG PO TABS
50.0000 mg | ORAL_TABLET | Freq: Two times a day (BID) | ORAL | Status: DC
  Administered 2016-03-14: 50 mg via ORAL
  Filled 2016-03-13: qty 14
  Filled 2016-03-13: qty 1

## 2016-03-13 MED ORDER — PANTOPRAZOLE SODIUM 20 MG PO TBEC
20.0000 mg | DELAYED_RELEASE_TABLET | Freq: Every day | ORAL | Status: DC
  Administered 2016-03-13 – 2016-03-14 (×2): 20 mg via ORAL
  Filled 2016-03-13 (×2): qty 1

## 2016-03-13 NOTE — Progress Notes (Signed)
D: Patient denies SI/HI and A/V hallucinations;   A: Monitored continuously in observation;  patient educated about medications; patient given medications per physician orders; patient encouraged to express feelings and/or concerns  R: Patient is resting in the bed and has no acute needs at this time

## 2016-03-13 NOTE — Progress Notes (Signed)
Patient sleeping at the time of shift change and continues to sleep at this time. Staff woke patient up for assessment. Patient denies pain, SI, AH/VH. Patient made no complain.  Sleeping at this time.  Will continue to monitor patient.

## 2016-03-13 NOTE — Progress Notes (Signed)
This Pt called the [employer] in [place], [place] and was able to get in touch with his friend. Pt informed this Clinical research associatewriter that his friend agreed to picking him up tomorrow around lunch time. Pt informed this Clinical research associatewriter that he does not know if his friend will allow him to stay at his house. This Clinical research associatewriter informed the Pt to speak with his friend about the option of staying with him as well as looking over the resources that were given to him by this Clinical research associatewriter for Alcohol and substance abuse treatment as well as housing options. This Pt was receptive and aware of all the information given to him by this Clinical research associatewriter.

## 2016-03-13 NOTE — Progress Notes (Signed)
This write provided this Pt with rehabilitation resources he was able to read and interpret around the community. Pt was informed to follow up on the resources he received by calling the facilities today or tomorrow. Pt was receptive to the information given to him by this Clinical research associatewriter.

## 2016-03-13 NOTE — Progress Notes (Signed)
This Clinical research associatewriter assisted the Pt with calling [employer] [facility] [employer] in [place], [place] to get in touch with his friend who he stated may allow him to stay with him.

## 2016-03-13 NOTE — Progress Notes (Signed)
[facility]-Observation Unit Progress Note  03/13/2016 1:57 PM Logan Hudson  MRN:  [id number] Subjective:    Patient states "I feel depressed. I was feeling that way in prison too. I was having thoughts to cut myself. 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 I am homeless. I was not able to stay on my psychiatric medications at the program I was in. My depression got worse and I have felt very hopeless."   Objective:   Patient is seen and chart is reviewed. He reports continuing to feel depressed today. Upon exploration with patient his triggers appear to be related to homelessness. Patient expressed that he was hopeful about an inpatient admission. This Probation officer explained that the patient's presenting issues could be managed while in the [facility] with medication management and assisting him with locating resources. Patient advised that his discharge date would be set for in the morning. The patient agreed to make phone call to a friend who might allow patient to reside with him following discharge. Logan Hudson has been receptive to receiving help from the [facility] staff regarding resources to help address his substance abuse. On admission his urine drug screen was positive for cocaine.   Principal Problem: MDD (major depressive disorder), recurrent severe, without psychosis ([place]) Diagnosis:   Patient Active Problem List   Diagnosis Date Noted  . Suicidal ideation [R45.851] 03/13/2016  . MDD (major depressive disorder), recurrent severe, without psychosis ([place]) [F33.2] 03/12/2016  . Cocaine abuse without complication [E28.00] 34/91/7915  . Bereavement [Z63.4] 05/23/2011   Total Time spent with patient: 30 minutes  Past Psychiatric History: MDD, Cocaine abuse   Past Medical History:  Past Medical History:  Diagnosis Date  . Depression    History reviewed. No pertinent surgical history. Family History: History reviewed. No pertinent family history. Family Psychiatric   History: See H & P Social History:  History  Alcohol Use  . Yes    Comment: occasional     History  Drug Use  . Types: Cocaine, Marijuana    Comment: daily use x 1 month    Social History   Social History  . Marital status: Widowed    Spouse name: N/A  . Number of children: N/A  . Years of education: N/A   Social History Main Topics  . Smoking status: Former Research scientist (life sciences)  . Smokeless tobacco: Never Used  . Alcohol use Yes     Comment: occasional  . Drug use:     Types: Cocaine, Marijuana     Comment: daily use x 1 month  . Sexual activity: Yes   Other Topics Concern  . None   Social History Narrative  . None   Additional Social History:                         Sleep: Fair  Appetite:  Good  Current Medications: Current Facility-Administered Medications  Medication Dose Route Frequency Provider Last Rate Last Dose  . acetaminophen (TYLENOL) tablet 650 mg  650 mg Oral Q4H PRN Ethelene Hal, NP   650 mg at 03/13/16 0800  . alum & mag hydroxide-simeth (MAALOX/MYLANTA) 200-200-20 MG/5ML suspension 30 mL  30 mL Oral Q4H PRN Ethelene Hal, NP   30 mL at 03/12/16 2056  . busPIRone (BUSPAR) tablet 15 mg  15 mg Oral BID Rozetta Nunnery, NP   15 mg at 03/13/16 0800  . hydrOXYzine (ATARAX/VISTARIL) tablet 25 mg  25 mg Oral Q6H  PRN Ethelene Hal, NP   25 mg at 03/12/16 2059  . ibuprofen (ADVIL,MOTRIN) tablet 600 mg  600 mg Oral Q8H PRN Ethelene Hal, NP      . magnesium hydroxide (MILK OF MAGNESIA) suspension 30 mL  30 mL Oral Daily PRN Ethelene Hal, NP      . nicotine (NICODERM CQ - dosed in mg/24 hours) patch 21 mg  21 mg Transdermal Daily Ethelene Hal, NP      . nystatin cream (MYCOSTATIN)   Topical BID Ethelene Hal, NP      . ondansetron [facility]) tablet 4 mg  4 mg Oral Q8H PRN Ethelene Hal, NP      . pantoprazole (PROTONIX) EC tablet 20 mg  20 mg Oral Daily Niel Hummer, NP   20 mg at 03/13/16 1148  .  QUEtiapine (SEROQUEL) tablet 150 mg  150 mg Oral QHS Rozetta Nunnery, NP   150 mg at 03/12/16 2301  . [START ON 03/14/2016] QUEtiapine (SEROQUEL) tablet 50 mg  50 mg Oral BID Niel Hummer, NP      . traZODone (DESYREL) tablet 50 mg  50 mg Oral QHS PRN Ethelene Hal, NP   50 mg at 03/12/16 2059    Lab Results:  Results for orders placed or performed during the hospital encounter of 03/12/16 (from the past 48 hour(s))  Comprehensive metabolic panel     Status: Abnormal   Collection Time: 03/12/16 11:41 AM  Result Value Ref Range   Sodium 139 135 - 145 mmol/L   Potassium 3.6 3.5 - 5.1 mmol/L   Chloride 105 101 - 111 mmol/L   CO2 25 22 - 32 mmol/L   Glucose, Bld 125 (H) 65 - 99 mg/dL   BUN 17 6 - 20 mg/dL   Creatinine, Ser 1.21 0.61 - 1.24 mg/dL   Calcium 9.5 8.9 - 10.3 mg/dL   Total Protein 7.5 6.5 - 8.1 g/dL   Albumin 4.1 3.5 - 5.0 g/dL   AST 28 15 - 41 U/L   ALT 31 17 - 63 U/L   Alkaline Phosphatase 53 38 - 126 U/L   Total Bilirubin 0.8 0.3 - 1.2 mg/dL   GFR calc non Af Amer >60 >60 mL/min   GFR calc Af Amer >60 >60 mL/min    Comment: (NOTE) The eGFR has been calculated using the CKD EPI equation. This calculation has not been validated in all clinical situations. eGFR's persistently <60 mL/min signify possible Chronic Kidney Disease.    Anion gap 9 5 - 15  Ethanol     Status: None   Collection Time: 03/12/16 11:41 AM  Result Value Ref Range   Alcohol, Ethyl (B) <5 <5 mg/dL    Comment:        LOWEST DETECTABLE LIMIT FOR SERUM ALCOHOL IS 5 mg/dL FOR MEDICAL PURPOSES ONLY   Salicylate level     Status: None   Collection Time: 03/12/16 11:41 AM  Result Value Ref Range   Salicylate Lvl <0.4 2.8 - 30.0 mg/dL  Acetaminophen level     Status: Abnormal   Collection Time: 03/12/16 11:41 AM  Result Value Ref Range   Acetaminophen (Tylenol), Serum <10 (L) 10 - 30 ug/mL    Comment:        THERAPEUTIC CONCENTRATIONS VARY SIGNIFICANTLY. A RANGE OF 10-30 ug/mL MAY BE AN  EFFECTIVE CONCENTRATION FOR MANY PATIENTS. HOWEVER, SOME ARE BEST TREATED AT CONCENTRATIONS OUTSIDE THIS RANGE. ACETAMINOPHEN CONCENTRATIONS >150 ug/mL AT  4 HOURS AFTER INGESTION AND >50 ug/mL AT 12 HOURS AFTER INGESTION ARE OFTEN ASSOCIATED WITH TOXIC REACTIONS.   cbc     Status: None   Collection Time: 03/12/16 11:41 AM  Result Value Ref Range   WBC 9.6 4.0 - 10.5 K/uL    Comment: WHITE COUNT CONFIRMED ON SMEAR   RBC 5.21 4.22 - 5.81 MIL/uL   Hemoglobin 14.9 13.0 - 17.0 g/dL   HCT 43.4 39.0 - 52.0 %   MCV 83.3 78.0 - 100.0 fL   MCH 28.6 26.0 - 34.0 pg   MCHC 34.3 30.0 - 36.0 g/dL   RDW 13.1 11.5 - 15.5 %   Platelets 359 150 - 400 K/uL  Rapid urine drug screen (hospital performed)     Status: Abnormal   Collection Time: 03/12/16  1:45 PM  Result Value Ref Range   Opiates NONE DETECTED NONE DETECTED   Cocaine POSITIVE (A) NONE DETECTED   Benzodiazepines NONE DETECTED NONE DETECTED   Amphetamines NONE DETECTED NONE DETECTED   Tetrahydrocannabinol NONE DETECTED NONE DETECTED   Barbiturates NONE DETECTED NONE DETECTED    Comment:        DRUG SCREEN FOR MEDICAL PURPOSES ONLY.  IF CONFIRMATION IS NEEDED FOR ANY PURPOSE, NOTIFY LAB WITHIN 5 DAYS.        LOWEST DETECTABLE LIMITS FOR URINE DRUG SCREEN Drug Class       Cutoff (ng/mL) Amphetamine      1000 Barbiturate      200 Benzodiazepine   790 Tricyclics       383 Opiates          300 Cocaine          300 THC              50     Blood Alcohol level:  Lab Results  Component Value Date   ETH <5 03/12/2016   ETH <11 33/83/2919    Metabolic Disorder Labs: No results found for: HGBA1C, MPG No results found for: PROLACTIN No results found for: CHOL, TRIG, HDL, CHOLHDL, VLDL, LDLCALC  Physical Findings: AIMS:  , ,  ,  ,    CIWA:    COWS:     Musculoskeletal: Strength & Muscle Tone: within normal limits Gait & Station: normal Patient leans: N/A  Psychiatric Specialty Exam: Physical Exam  Review of  Systems  Psychiatric/Behavioral: Positive for depression, substance abuse and suicidal ideas. Negative for hallucinations and memory loss. The patient is nervous/anxious and has insomnia.     Blood pressure (!) 94/54, pulse (!) 109, temperature 98.5 F (36.9 C), temperature source Oral, resp. rate 18, height 5' 9"  (1.753 m), weight 113.4 kg (250 lb).Body mass index is 36.92 kg/m.  General Appearance: Casual  Eye Contact:  Good  Speech:  Clear and Coherent  Volume:  Normal  Mood:  Depressed  Affect:  Flat  Thought Process:  Coherent and Goal Directed  Orientation:  Full (Time, Place, and Person)  Thought Content:  Symptoms, worries, concerns   Suicidal Thoughts:  Yes.  with intent/plan able to contract for safety in the hospital   Homicidal Thoughts:  No  Memory:  Immediate;   Good Recent;   Good Remote;   Good  Judgement:  Poor  Insight:  Shallow  Psychomotor Activity:  Decreased  Concentration:  Concentration: Good and Attention Span: Good  Recall:  Good  Fund of Knowledge:  Good  Language:  Good  Akathisia:  No  Handed:  Right  AIMS (if  indicated):     Assets:  Communication Skills Desire for Improvement Leisure Time Resilience  ADL's:  Intact  Cognition:  WNL  Sleep:        Treatment Plan Summary: Daily contact with patient to assess and evaluate symptoms and progress in treatment and Medication management   -Continue Buspar 15 mg BID for anxiety -Continue Seroquel XR 100 mg daily and Seroquel 150 mg hs for mood stabilization -Anticipate discharge tomorrow morning with follow up plan in place to address current stressors.   Elmarie Shiley, NP 03/13/2016, 1:57 PM

## 2016-03-13 NOTE — Progress Notes (Signed)
This writer handed the Pt a list of resources he could use after discharge.This Clinical research associatewriter spoke with patient in regard to Alcohol and substance abuse treatment options and getting in contact with them. Pt stated that he is interested in a rehab facility. This Clinical research associatewriter gave the Pt resources on rehab facilities in the community area to see if he may be interested. Pt stated that he needs assistance with seeing them due to not having his eye glasses. This Clinical research associatewriter informed the Pt that she will assit him in looking at possible facilities in the area. This Clinical research associatewriter informed the Pt that she will be able to provide him with the numbers to call the facilities and get in touch with them if need be. The Pt was receptive to this communication between Retail bankerthe writer and himself. This Clinical research associatewriter spoke with this pt about housing options an the Pt stated that he wanted to get in touch with his friend in regards to housing options.

## 2016-03-13 NOTE — Progress Notes (Deleted)
This Clinical research associatewriter assisted the Pt with calling [employer] [facility] [employer] in [place], [place] to get in touch with his friend who he stated may allow him to stay with him until [facility].
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
## 2016-03-13 NOTE — Progress Notes (Deleted)
This Pt called the [employer] in [place], [place] and was able to get in touch with his friend. Pt informed this Clinical research associatewriter that his friend agreed to picking him up tomorrow around lunch time. Pt informed this Clinical research associatewriter that he does not know if his friend will allow him to stay at his house for a few days until his appointment at [facility]. This Clinical research associatewriter informed the Pt to speak with his friend about the option of staying with him as well as looking over the resources that were given to him by this Clinical research associatewriter for Alcohol and substance abuse treatment as well as housing options. This Pt was receptive and aware of all the information given to him by this Clinical research associatewriter.
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
## 2016-03-14 MED ORDER — TRAZODONE HCL 50 MG PO TABS: 50.0000 mg | ORAL_TABLET | Freq: Every evening | ORAL | 0 refills | Status: DC | PRN

## 2016-03-14 MED ORDER — BUSPIRONE HCL 15 MG PO TABS: 15.0000 mg | ORAL_TABLET | Freq: Two times a day (BID) | ORAL | 0 refills | Status: DC

## 2016-03-14 MED ORDER — QUETIAPINE FUMARATE 50 MG PO TABS: 50.0000 mg | ORAL_TABLET | Freq: Two times a day (BID) | ORAL | 0 refills | Status: DC

## 2016-03-14 MED ORDER — QUETIAPINE FUMARATE 50 MG PO TABS
150.0000 mg | ORAL_TABLET | Freq: Every day | ORAL | Status: DC
  Filled 2016-03-14: qty 21

## 2016-03-14 MED ORDER — QUETIAPINE FUMARATE 50 MG PO TABS: 150.0000 mg | ORAL_TABLET | Freq: Every day | ORAL | 0 refills | Status: DC
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
## 2016-03-14 NOTE — Progress Notes (Signed)
10/21 Patient will be discharged by 2pm. This writer provided the patient with resources about [facility] so he can set up an appointment on Monday. The pt will be provided with sample medications until he receives [facility]. Carmell AustriaAisha Alyanah Elliott 11:07am

## 2016-03-14 NOTE — Progress Notes (Signed)
Patient ID: Logan Hudson, male   DOB: 20-Jun-1965, 50 y.o.   MRN: [id number]  States he knows he will be discharged from OBS today and his plan is to stay with a friend for awhile, and that same friend is going to pick him up from here today when he is discharged. He is able to contract for safety. Waiting on the NP to eval him today for his disposition.

## 2016-03-14 NOTE — Progress Notes (Signed)
Patient ID: Logan RiedelJames O Hudson, male   DOB: 1965/08/20, 50 y.o.   MRN: [id number]  Discharge Note-Seen by Charisse MarchLaura D. NP.and she determined he was safe for discharge.Plan for follow up is to get services thru [facility] as [place] in [place]. He is going to be living with a friend there. Had expected his friend to pick him up, but now he is unable to contact him, so his alternate plan is to go to [place] where he has another fiiend and stay with him. He was [facility] the [facility] to use the [facility] to get to [place]. He is able to contract for safety. Was given medications since he has no insurance or money to fill a prescription. Vernona RiegerLaura NP also gave him Rx's but he told writer to destroy them since he has no way to fill them. All property returned to him and he changed clothes for discharge.Escorted to lobby to catch the bus for [place].

## 2017-05-30 DIAGNOSIS — D72829 Elevated white blood cell count, unspecified: Secondary | ICD-10-CM

## 2017-05-30 DIAGNOSIS — I517 Cardiomegaly: Secondary | ICD-10-CM

## 2017-05-30 DIAGNOSIS — E872 Acidosis: Secondary | ICD-10-CM

## 2017-05-30 DIAGNOSIS — F1910 Other psychoactive substance abuse, uncomplicated: Secondary | ICD-10-CM

## 2017-05-30 DIAGNOSIS — Z590 Homelessness: Secondary | ICD-10-CM

## 2017-05-30 DIAGNOSIS — R45851 Suicidal ideations: Secondary | ICD-10-CM

## 2017-05-30 DIAGNOSIS — F1410 Cocaine abuse, uncomplicated: Secondary | ICD-10-CM

## 2017-05-30 DIAGNOSIS — N179 Acute kidney failure, unspecified: Secondary | ICD-10-CM

## 2017-05-30 DIAGNOSIS — R079 Chest pain, unspecified: Secondary | ICD-10-CM

## 2017-05-30 DIAGNOSIS — R748 Abnormal levels of other serum enzymes: Secondary | ICD-10-CM

## 2017-05-30 DIAGNOSIS — R945 Abnormal results of liver function studies: Secondary | ICD-10-CM

## 2017-05-31 DIAGNOSIS — I517 Cardiomegaly: Secondary | ICD-10-CM

## 2017-06-22 ENCOUNTER — Encounter: Payer: Self-pay | Admitting: Psychiatry

## 2017-06-22 ENCOUNTER — Inpatient Hospital Stay
Admission: AD | Admit: 2017-06-22 | Discharge: 2017-06-28 | DRG: 885 | Disposition: A | Discharge status: Home / Self Care | Payer: No Typology Code available for payment source | Source: Intra-hospital | Attending: Psychiatry | Admitting: Psychiatry

## 2017-06-22 ENCOUNTER — Other Ambulatory Visit: Payer: Self-pay

## 2017-06-22 DIAGNOSIS — R45851 Suicidal ideations: Secondary | ICD-10-CM

## 2017-06-22 DIAGNOSIS — Z818 Family history of other mental and behavioral disorders: Secondary | ICD-10-CM

## 2017-06-22 DIAGNOSIS — K219 Gastro-esophageal reflux disease without esophagitis: Secondary | ICD-10-CM | POA: Diagnosis present

## 2017-06-22 DIAGNOSIS — F410 Panic disorder [episodic paroxysmal anxiety] without agoraphobia: Secondary | ICD-10-CM | POA: Diagnosis present

## 2017-06-22 DIAGNOSIS — F250 Schizoaffective disorder, bipolar type: Secondary | ICD-10-CM

## 2017-06-22 DIAGNOSIS — Z888 Allergy status to other drugs, medicaments and biological substances status: Secondary | ICD-10-CM | POA: Diagnosis not present

## 2017-06-22 DIAGNOSIS — Z886 Allergy status to analgesic agent status: Secondary | ICD-10-CM

## 2017-06-22 DIAGNOSIS — F332 Major depressive disorder, recurrent severe without psychotic features: Secondary | ICD-10-CM | POA: Diagnosis present

## 2017-06-22 DIAGNOSIS — Z79899 Other long term (current) drug therapy: Secondary | ICD-10-CM | POA: Diagnosis not present

## 2017-06-22 HISTORY — DX: Schizoaffective disorder, bipolar type: F25.0

## 2017-06-22 MED ORDER — TRAZODONE HCL 100 MG PO TABS
100.0000 mg | ORAL_TABLET | Freq: Every day | ORAL | Status: DC
  Administered 2017-06-22 – 2017-06-27 (×6): 100 mg via ORAL
  Filled 2017-06-22 (×6): qty 1

## 2017-06-22 MED ORDER — ACETAMINOPHEN 325 MG PO TABS: 650.0000 mg | ORAL_TABLET | Freq: Four times a day (QID) | ORAL | Status: DC | PRN

## 2017-06-22 MED ORDER — HYDROXYZINE HCL 25 MG PO TABS
25.0000 mg | ORAL_TABLET | Freq: Three times a day (TID) | ORAL | Status: DC | PRN
  Administered 2017-06-26 – 2017-06-27 (×2): 25 mg via ORAL
  Filled 2017-06-22 (×2): qty 1

## 2017-06-22 MED ORDER — PANTOPRAZOLE SODIUM 40 MG PO TBEC
40.0000 mg | DELAYED_RELEASE_TABLET | Freq: Every day | ORAL | Status: DC
  Administered 2017-06-22 – 2017-06-28 (×7): 40 mg via ORAL
  Filled 2017-06-22 (×8): qty 1

## 2017-06-22 MED ORDER — ALUM & MAG HYDROXIDE-SIMETH 200-200-20 MG/5ML PO SUSP: 30.0000 mL | ORAL | Status: DC | PRN

## 2017-06-22 MED ORDER — OLANZAPINE 5 MG PO TABS
15.0000 mg | ORAL_TABLET | Freq: Every day | ORAL | Status: DC
  Administered 2017-06-22: 15 mg via ORAL
  Filled 2017-06-22: qty 1

## 2017-06-22 MED ORDER — MAGNESIUM HYDROXIDE 400 MG/5ML PO SUSP: 30.0000 mL | Freq: Every day | ORAL | Status: DC | PRN

## 2017-06-22 NOTE — Progress Notes (Signed)
The patient is admitted this evening  from [facility] to room 304. Alert and oriented x 4. Denied any acute pain at this moment. Skin assessment done with Shatara RN, noted small abrasion on his left knee and left palm. Patient verbalized his anxiety level of 6/10 and depression level of 6/10 as well. verbalized hearing voices  nd was trying to jump in front of a vehicle to kill himself when a  sheriff saw  him and took him to the hospital. Hiis belongs were searched and noted clothing shoes with  Strings. The shoes with strings ocked in [employer] locker. Patient oriented to his room and the unit. His questions were answered. Dinner offered. Patient voiced no concerns at this time. Will continue to check q 15 minutes and camera survallanc.

## 2017-06-22 NOTE — Tx Team (Signed)
Initial Treatment Plan 06/22/2017 5:59 PM Anette RiedelJames O Hupfer RUE:[id number]    PATIENT STRESSORS: Financial difficulties Substance abuse   PATIENT STRENGTHS: Ability for insight Motivation for treatment/growth Supportive family/friends   PATIENT IDENTIFIED PROBLEMS:   Coping skills  Substance abuse                 DISCHARGE CRITERIA:  Ability to meet basic life and health needs Adequate post-discharge living arrangements Improved stabilization in mood, thinking, and/or behavior Motivation to continue treatment in a less acute level of care  PRELIMINARY DISCHARGE PLAN: Return to previous living arrangement  PATIENT/FAMILY INVOLVEMENT: This treatment plan has been presented to and reviewed with the patient, Anette RiedelJames O Reta, and/or family member.  The patient and family have been given the opportunity to ask questions and make suggestions.  Leamon ArntShatara K Briaunna Grindstaff, RN 06/22/2017, 5:59 PM

## 2017-06-22 NOTE — [facility] Group Notes (Signed)
[facility] Group Notes:  (Nursing/MHT/Case Management/Adjunct)  Date:  06/22/2017  Time:  9:22 PM  Type of Therapy:  Group Therapy  Participation Level:  Did Not Attend  Participation Quality: Summary of Progress/Problems:  Logan Hudson 06/22/2017, 9:22 PM

## 2017-06-22 NOTE — Progress Notes (Signed)
Patient ID: Anette RiedelJames O Fee, male   DOB: [birth year], 52 y.o.   MRN: [id number] In bed resting, pleasant on approach, sad, depressed; A&Ox3, denied pain, still endorsing suicide, "I wanted to jump into a moving traffic, I can't do that while I am here; I will not harm/hurt myself why I am here..." Medication compliant, will continue to encourage verbalization of feelings.

## 2017-06-23 DIAGNOSIS — R45851 Suicidal ideations: Secondary | ICD-10-CM

## 2017-06-23 DIAGNOSIS — F250 Schizoaffective disorder, bipolar type: Principal | ICD-10-CM

## 2017-06-23 LAB — HEMOGLOBIN A1C
HEMOGLOBIN A1C: 5.3 % (ref 4.8–5.6)
Mean Plasma Glucose: 105.41 mg/dL

## 2017-06-23 LAB — LIPID PANEL
CHOLESTEROL: 151 mg/dL (ref 0–200)
HDL: 61 mg/dL (ref >40)
LDL Cholesterol: 71 mg/dL (ref 0–99)
Total CHOL/HDL Ratio: 2.5 RATIO
Triglycerides: 96 mg/dL (ref <150)
VLDL: 19 mg/dL (ref 0–40)

## 2017-06-23 LAB — TSH: TSH: 1.645 u[IU]/mL (ref 0.350–4.500)

## 2017-06-23 MED ORDER — QUETIAPINE FUMARATE 200 MG PO TABS
200.0000 mg | ORAL_TABLET | Freq: Every day | ORAL | Status: DC
  Administered 2017-06-23 – 2017-06-27 (×5): 200 mg via ORAL
  Filled 2017-06-23 (×5): qty 1

## 2017-06-23 MED ORDER — BUSPIRONE HCL 15 MG PO TABS
15.0000 mg | ORAL_TABLET | Freq: Two times a day (BID) | ORAL | Status: DC
  Administered 2017-06-23 – 2017-06-28 (×9): 15 mg via ORAL
  Filled 2017-06-23 (×10): qty 1

## 2017-06-23 MED ORDER — QUETIAPINE FUMARATE 25 MG PO TABS
50.0000 mg | ORAL_TABLET | Freq: Three times a day (TID) | ORAL | Status: DC
  Administered 2017-06-23 – 2017-06-28 (×14): 50 mg via ORAL
  Filled 2017-06-23 (×14): qty 2

## 2017-06-23 NOTE — [facility] Suicide Risk Assessment (Signed)
[facility] Admission Suicide Risk Assessment   Nursing information obtained from:    Demographic factors:    Current Mental Status:    Loss Factors:    Historical Factors:    Risk Reduction Factors:     Total Time spent with patient: 1 hour Principal Problem: Schizoaffective disorder, bipolar type (HCC) Diagnosis:   Patient Active Problem List   Diagnosis Date Noted  . Schizoaffective disorder, bipolar type (HCC) [F25.0] 06/22/2017    Priority: High  . Suicidal ideation [R45.851] 03/13/2016  . MDD (major depressive disorder), recurrent severe, without psychosis (HCC) [F33.2] 03/12/2016  . Cocaine abuse without complication [facility]) [F14.10] 05/23/2011   Subjective Data: suicide attempt  Continued Clinical Symptoms:  Alcohol Use Disorder Identification Test Final Score (AUDIT): 0 The "Alcohol Use Disorders Identification Test", Guidelines for Use in Primary Care, Second Edition.  World Science writer [facility]). Score between 0-7:  no or low risk or alcohol related problems. Score between 8-15:  moderate risk of alcohol related problems. Score between 16-19:  high risk of alcohol related problems. Score 20 or above:  warrants further diagnostic evaluation for alcohol dependence and treatment.   CLINICAL FACTORS:   Bipolar Disorder:   Depressive phase Depression:   Impulsivity Currently Psychotic   Musculoskeletal: Strength & Muscle Tone: within normal limits Gait & Station: normal Patient leans: N/A  Psychiatric Specialty Exam: Physical Exam  Nursing note and vitals reviewed. Psychiatric: His speech is normal. His affect is blunt. He is withdrawn and actively hallucinating. Cognition and memory are normal. He expresses impulsivity. He exhibits a depressed mood. He expresses suicidal ideation. He expresses suicidal plans.    Review of Systems  Neurological: Negative.   Psychiatric/Behavioral: Positive for depression, hallucinations and suicidal ideas.  All other systems  reviewed and are negative.   Blood pressure 112/74, pulse 78, temperature 97.6 F (36.4 C), temperature source Oral, resp. rate 20, height 6' (1.829 m), weight 109.8 kg (242 lb), SpO2 99 %.Body mass index is 32.82 kg/m.  General Appearance: Disheveled  Eye Contact:  Fair  Speech:  Clear and Coherent  Volume:  Decreased  Mood:  Depressed and Hopeless  Affect:  Blunt  Thought Process:  Goal Directed and Descriptions of Associations: Intact  Orientation:  Full (Time, Place, and Person)  Thought Content:  Hallucinations: Auditory Command:  to kill self  Suicidal Thoughts:  Yes.  with intent/plan  Homicidal Thoughts:  No  Memory:  Immediate;   Fair Recent;   Fair Remote;   Fair  Judgement:  Poor  Insight:  Lacking  Psychomotor Activity:  Psychomotor Retardation  Concentration:  Concentration: Fair and Attention Span: Fair  Recall:  [employer] of Knowledge:  Fair  Language:  Fair  Akathisia:  No  Handed:  Right  AIMS (if indicated):     Assets:  Communication Skills Desire for Improvement Physical Health Resilience Social Support  ADL's:  Intact  Cognition:  WNL  Sleep:  Number of Hours: 6.15      COGNITIVE FEATURES THAT CONTRIBUTE TO RISK:  None    SUICIDE RISK:   Moderate:  Frequent suicidal ideation with limited intensity, and duration, some specificity in terms of plans, no associated intent, good self-control, limited dysphoria/symptomatology, some risk factors present, and identifiable protective factors, including available and accessible social support.  PLAN OF CARE: hospital admission, medication management, discharge planning.  Mr. Heslin is a 52 year old male with a history of schizoaffective disorder brought to the hospital by the police when he tried to step  in front of the traffic.   #Suicidal ideation -patient able to contract for safety in the hospital  #Mood -Seroquel 200 mg at night, 50 mg TID -Buspar 15 mg BID -Trazodone 100 mg  nightly  #GERD -Protonix 40 mg daily  #Metabolic syndrome monitoring -Lipid panel TSH and HgbA1C are pending -EKG, pending  #Disposition -discharge to friend's house -follow up with [facility]   I certify that inpatient services furnished can reasonably be expected to improve the patient's condition.   Kristine LineaJolanta Bijan Ridgley, MD 06/23/2017, 12:55 PM

## 2017-06-23 NOTE — Progress Notes (Signed)
Recreation Therapy Notes  INPATIENT RECREATION THERAPY ASSESSMENT  Patient Details Name: Logan Hudson MRN: [id number] DOB: [birth year] Today's Date: 06/23/2017       Information Obtained From: Patient  Able to Participate in Assessment/Interview: Yes  Patient Presentation: Responsive  Reason for Admission (Per Patient): Suicidal Ideation, Other (Comments)(Depression)  Patient Stressors: Other (Comment)(Homelessness, depression)  Coping Skills:   TV  Leisure Interests (2+):     Frequency of Recreation/Participation: Monthly  Awareness of Community Resources:  No  Community Resources:     Current Use:    If no, Barriers?:    Expressed Interest in [employer] Resource Information: No  Patient Main Form of Transportation: Walk  Patient Strengths:  Do not know  Patient Identified Areas of Improvement:  Stay off drugs  Current Recreation Participation:  [place], palystation   Patient Goal for Hospitalization:  Try not to hear the voices and get mediciation adjusted  [place]  [place]idence[place]  Current SI (including self-harm):  No  Current HI:  No  Current AVH: Yes  Staff Intervention Plan: Group Attendance, Collaborate with Interdisciplinary Treatment Team  Consent to Intern Participation: N/A  [name] 06/23/2017, 2:47 PM

## 2017-06-23 NOTE — Progress Notes (Signed)
Recreation Therapy Notes   Date: 01.30.2018  Time: 3:00pm  Location: Craft room  Behavioral response: Appropriate  Group Type: Craft  Participation level: Active  Communication: Patient was social with peers and staff during group.  Comments: N/A  Travor Royce LRT/CTRS        Mackey Varricchio 06/23/2017 4:06 PM

## 2017-06-23 NOTE — H&P (Signed)
Psychiatric Admission Assessment Adult  Patient Identification: Logan Hudson MRN:  [id number] Date of Evaluation:  06/23/2017 Chief Complaint:  Depression Principal Diagnosis: Schizoaffective disorder, bipolar type (HCC) Diagnosis:   Patient Active Problem List   Diagnosis Date Noted  . Schizoaffective disorder, bipolar type (HCC) [F25.0] 06/22/2017    Priority: High  . Suicidal ideation [R45.851] 03/13/2016  . MDD (major depressive disorder), recurrent severe, without psychosis (HCC) [F33.2] 03/12/2016  . Cocaine abuse without complication [facility]) [F14.10] 05/23/2011   History of Present Illness:   Identifying data. Logan Hudson is a 52 year old male with a history of schizoaffective disorder.  Chief complaint. "I don;t have energy."  History of present illness. Information was obtained from the patient and the chart. The patient was brought to [facility] by the police after an officer noticed that the patient was trying to step in front of traffic. The patient reports that since he was switched from Seroquel to Zyprexa by his psychiatrist at [facility], he becam increasingly depressed with poor sleep, decreased appetite, anhedonia, feeling of hopelessness helplessness and guilt, [poor energy and concentration, social isolation and crying spells that culminated in suicide attempt. He reports heightened anxiety with frequent panic attacks and auditory command hallucinations. He has been free of cocaine for 3 weeks and compliant with medications.  Past psychiatric history. Diagnosed with bipolar and schizophrenia. Multiple suicide attempts, medication trials and hospitalizations. Reports that in the past 4 years, he has been to [facility] ER over 20 times.  Family psychiatric history. Grandmother with depression and anxiety.  Social history. He is separated from his wife. He came to  several years ago following a women. He is unemployed and uninsured. Applied for disability. Lives  with friends for free but they needed him out of the house for a few days.  Total Time spent with patient: 1 hour  Is the patient at risk to self? Yes.    Has the patient been a risk to self in the past 6 months? Yes.    Has the patient been a risk to self within the distant past? Yes.    Is the patient a risk to others? No.  Has the patient been a risk to others in the past 6 months? No.  Has the patient been a risk to others within the distant past? No.   Prior Inpatient Therapy:   Prior Outpatient Therapy:    Alcohol Screening: Patient refused Alcohol Screening Tool: (No) 1. How often do you have a drink containing alcohol?: Never 2. How many drinks containing alcohol do you have on a typical day when you are drinking?: 1 or 2 3. How often do you have six or more drinks on one occasion?: Never AUDIT-C Score: 0 4. How often during the last year have you found that you were not able to stop drinking once you had started?: Never 5. How often during the last year have you failed to do what was normally expected from you becasue of drinking?: Never 6. How often during the last year have you needed a first drink in the morning to get yourself going after a heavy drinking session?: Never 7. How often during the last year have you had a feeling of guilt of remorse after drinking?: Never 8. How often during the last year have you been unable to remember what happened the night before because you had been drinking?: Never 9. Have you or someone else been injured as a result of your drinking?: No 10. Has  a relative or friend or a doctor or another health worker been concerned about your drinking or suggested you cut down?: No Alcohol Use Disorder Identification Test Final Score (AUDIT): 0 Intervention/Follow-up: AUDIT Score <7 follow-up not indicated Substance Abuse History in the last 12 months:  Yes.   Consequences of Substance Abuse: Negative Previous Psychotropic Medications: Yes   Psychological Evaluations: No  Past Medical History:  Past Medical History:  Diagnosis Date  . Depression    History reviewed. No pertinent surgical history. Family History: History reviewed. No pertinent family history.  Tobacco Screening: Have you used any form of tobacco in the last 30 days? (Cigarettes, Smokeless Tobacco, Cigars, and/or Pipes): No Social History:  Social History   Substance and Sexual Activity  Alcohol Use Yes   Comment: occasional     Social History   Substance and Sexual Activity  Drug Use Yes  . Types: Cocaine, Marijuana   Comment: daily use x 1 month    Additional Social History:                           Allergies:   Allergies  Allergen Reactions  . Asa [Aspirin]   . Nicotine   . Paxil [Paroxetine Hcl]   . Tylenol [Acetaminophen]    Lab Results:  Results for orders placed or performed during the hospital encounter of 06/22/17 (from the past 48 hour(s))  Lipid panel     Status: None   Collection Time: 06/23/17  7:28 AM  Result Value Ref Range   Cholesterol 151 0 - 200 mg/dL   Triglycerides 96 <161 mg/dL   HDL 61 >09 mg/dL   Total CHOL/HDL Ratio 2.5 RATIO   VLDL 19 0 - 40 mg/dL   LDL Cholesterol 71 0 - 99 mg/dL    Comment:        Total Cholesterol/HDL:CHD Risk Coronary Heart Disease Risk Table                     Men   Women  1/2 Average Risk   3.4   3.3  Average Risk       5.0   4.4  2 X Average Risk   9.6   7.1  3 X Average Risk  23.4   11.0        Use the calculated Patient Ratio above and the CHD Risk Table to determine the patient's CHD Risk.        ATP III CLASSIFICATION (LDL):  <100     mg/dL   Optimal  604-540  mg/dL   Near or Above                    Optimal  130-159  mg/dL   Borderline  981-191  mg/dL   High  >478     mg/dL   Very High Performed at [facility], [address] [place], [place] [address]   TSH     Status: None   Collection Time: 06/23/17  7:28 AM  Result Value Ref Range    TSH 1.645 0.350 - 4.500 uIU/mL    Comment: Performed by a 3rd Generation assay with a functional sensitivity of <=0.01 uIU/mL. Performed at [facility], [address] [place], [place] [address]   Hemoglobin A1c     Status: None   Collection Time: 06/23/17  7:28 AM  Result Value Ref Range   Hgb A1c MFr Bld 5.3  4.8 - 5.6 %    Comment: (NOTE) Pre diabetes:          5.7%-6.4% Diabetes:              >6.4% Glycemic control for   <7.0% adults with diabetes    Mean Plasma Glucose 105.41 mg/dL    Comment: Performed at [facility], [address] [place], [place] [address]    Blood Alcohol level:  Lab Results  Component Value Date   [facility] <5 03/12/2016   ETH <11 05/22/2011    Metabolic Disorder Labs:  Lab Results  Component Value Date   HGBA1C 5.3 06/23/2017   MPG 105.41 06/23/2017   No results found for: PROLACTIN Lab Results  Component Value Date   CHOL 151 06/23/2017   TRIG 96 06/23/2017   HDL 61 06/23/2017   CHOLHDL 2.5 06/23/2017   VLDL 19 06/23/2017   LDLCALC 71 06/23/2017    Current Medications: Current Facility-Administered Medications  Medication Dose Route Frequency Provider Last Rate Last Dose  . acetaminophen (TYLENOL) tablet 650 mg  650 mg Oral Q6H PRN Anaiah Mcmannis B, MD      . alum & mag hydroxide-simeth (MAALOX/MYLANTA) 200-200-20 MG/5ML suspension 30 mL  30 mL Oral Q4H PRN Sani Madariaga B, MD      . busPIRone (BUSPAR) tablet 15 mg  15 mg Oral BID Hayes Czaja B, MD      . hydrOXYzine (ATARAX/VISTARIL) tablet 25 mg  25 mg Oral TID PRN Ennis Heavner B, MD      . magnesium hydroxide (MILK OF MAGNESIA) suspension 30 mL  30 mL Oral Daily PRN Jonus Coble B, MD      . pantoprazole (PROTONIX) EC tablet 40 mg  40 mg Oral Daily Katina Remick B, MD   40 mg at 06/23/17 0812  . QUEtiapine (SEROQUEL) tablet 200 mg  200 mg Oral QHS Deborrah Mabin B, MD      . QUEtiapine (SEROQUEL) tablet 50 mg  50 mg Oral TID  Modesta Sammons B, MD      . traZODone (DESYREL) tablet 100 mg  100 mg Oral QHS Jabbar Palmero B, MD   100 mg at 06/22/17 2122   PTA Medications: Medications Prior to Admission  Medication Sig Dispense Refill Last Dose  . busPIRone (BUSPAR) 15 MG tablet Take 1 tablet (15 mg total) by mouth 2 (two) times daily. 30 tablet 0   . QUEtiapine (SEROQUEL) 50 MG tablet Take 3 tablets (150 mg total) by mouth at bedtime. 42 tablet 0   . QUEtiapine (SEROQUEL) 50 MG tablet Take 1 tablet (50 mg total) by mouth 2 (two) times daily. 30 tablet 0   . traZODone (DESYREL) 50 MG tablet Take 1 tablet (50 mg total) by mouth at bedtime as needed for sleep. 14 tablet 0     Musculoskeletal: Strength & Muscle Tone: within normal limits Gait & Station: normal Patient leans: N/A  Psychiatric Specialty Exam: Physical Exam  Nursing note and vitals reviewed. Constitutional: He is oriented to person, place, and time. He appears well-developed and well-nourished.  HENT:  Head: Normocephalic and atraumatic.  Eyes: Conjunctivae and EOM are normal. Pupils are equal, round, and reactive to light.  Neck: Normal range of motion. Neck supple.  Cardiovascular: Normal rate, regular rhythm and normal heart sounds.  Respiratory: Effort normal and breath sounds normal.  GI: Soft. Bowel sounds are normal.  Musculoskeletal: Normal range of motion.  Neurological: He is alert and oriented to person, place, and  time.  Skin: Skin is warm and dry.  Psychiatric: His speech is normal. His affect is blunt. He is withdrawn and actively hallucinating. Cognition and memory are normal. He expresses impulsivity. He exhibits a depressed mood. He expresses suicidal ideation. He expresses suicidal plans.    Review of Systems  Neurological: Negative.   Psychiatric/Behavioral: Positive for depression, hallucinations and suicidal ideas.  All other systems reviewed and are negative.   Blood pressure 112/74, pulse 78, temperature 97.6 F  (36.4 C), temperature source Oral, resp. rate 20, height 6' (1.829 m), weight 109.8 kg (242 lb), SpO2 99 %.Body mass index is 32.82 kg/m.  See SRA                                                  Sleep:  Number of Hours: 6.15    Treatment Plan Summary: Daily contact with patient to assess and evaluate symptoms and progress in treatment and Medication management   Logan Hudson is a 52 year old male with a history of schizoaffective disorder brought to the hospital by the police when he tried to step in front of the traffic.   #Suicidal ideation -patient able to contract for safety in the hospital  #Mood -Seroquel 200 mg at night, 50 mg TID -Buspar 15 mg BID -Trazodone 100 mg nightly  #GERD -Protonix 40 mg daily  #Metabolic syndrome monitoring -Lipid panel TSH and HgbA1C are pending -EKG, pending  #Disposition -discharge to friend's house -follow up with [facility]   Observation Level/Precautions:  15 minute checks  Laboratory:  CBC Chemistry Profile UDS UA  Psychotherapy:    Medications:    Consultations:    Discharge Concerns:    Estimated LOS:  Other:     Physician Treatment Plan for Primary Diagnosis: Schizoaffective disorder, bipolar type (HCC) Long Term Goal(s): Improvement in symptoms so as ready for discharge  Short Term Goals: Ability to identify changes in lifestyle to reduce recurrence of condition will improve, Ability to verbalize feelings will improve, Ability to disclose and discuss suicidal ideas, Ability to demonstrate self-control will improve, Ability to identify and develop effective coping behaviors will improve, Ability to maintain clinical measurements within normal limits will improve and Ability to identify triggers associated with substance abuse/mental health issues will improve  Physician Treatment Plan for Secondary Diagnosis: Principal Problem:   Schizoaffective disorder, bipolar type (HCC) Active Problems:   Suicidal  ideation  Long Term Goal(s): NA  Short Term Goals: NA  I certify that inpatient services furnished can reasonably be expected to improve the patient's condition.    Kristine Linea, MD 1/30/20191:05 PM

## 2017-06-23 NOTE — Tx Team (Addendum)
Interdisciplinary Treatment and Diagnostic Plan Update  06/23/2017 Time of Session: 10:35am Logan Hudson MRN: [id number]  Principal Diagnosis: Schizoaffective disorder, bipolar type [facility])  Secondary Diagnoses: Principal Problem:   Schizoaffective disorder, bipolar type (HCC) Active Problems:   Suicidal ideation   Current Medications:  Current Facility-Administered Medications  Medication Dose Route Frequency Provider Last Rate Last Dose  . acetaminophen (TYLENOL) tablet 650 mg  650 mg Oral Q6H PRN Pucilowska, Jolanta B, MD      . alum & mag hydroxide-simeth (MAALOX/MYLANTA) 200-200-20 MG/5ML suspension 30 mL  30 mL Oral Q4H PRN Pucilowska, Jolanta B, MD      . hydrOXYzine (ATARAX/VISTARIL) tablet 25 mg  25 mg Oral TID PRN Pucilowska, Jolanta B, MD      . magnesium hydroxide (MILK OF MAGNESIA) suspension 30 mL  30 mL Oral Daily PRN Pucilowska, Jolanta B, MD      . OLANZapine (ZYPREXA) tablet 15 mg  15 mg Oral QHS Pucilowska, Jolanta B, MD   15 mg at 06/22/17 2122  . pantoprazole (PROTONIX) EC tablet 40 mg  40 mg Oral Daily Pucilowska, Jolanta B, MD   40 mg at 06/23/17 0812  . traZODone (DESYREL) tablet 100 mg  100 mg Oral QHS Pucilowska, Jolanta B, MD   100 mg at 06/22/17 2122   PTA Medications: Medications Prior to Admission  Medication Sig Dispense Refill Last Dose  . busPIRone (BUSPAR) 15 MG tablet Take 1 tablet (15 mg total) by mouth 2 (two) times daily. 30 tablet 0   . QUEtiapine (SEROQUEL) 50 MG tablet Take 3 tablets (150 mg total) by mouth at bedtime. 42 tablet 0   . QUEtiapine (SEROQUEL) 50 MG tablet Take 1 tablet (50 mg total) by mouth 2 (two) times daily. 30 tablet 0   . traZODone (DESYREL) 50 MG tablet Take 1 tablet (50 mg total) by mouth at bedtime as needed for sleep. 14 tablet 0     Patient Stressors: Financial difficulties Substance abuse  Patient Strengths: Ability for insight Motivation for treatment/growth Supportive family/friends  Treatment Modalities:  Medication Management, Group therapy, Case management,  1 to 1 session with clinician, Psychoeducation, Recreational therapy.   Physician Treatment Plan for Primary Diagnosis: Schizoaffective disorder, bipolar type (HCC) Long Term Goal(s):     Short Term Goals:    Medication Management: Evaluate patient's response, side effects, and tolerance of medication regimen.  Therapeutic Interventions: 1 to 1 sessions, Unit Group sessions and Medication administration.  Evaluation of Outcomes: Progressing  Physician Treatment Plan for Secondary Diagnosis: Principal Problem:   Schizoaffective disorder, bipolar type (HCC) Active Problems:   Suicidal ideation  Long Term Goal(s):     Short Term Goals:       Medication Management: Evaluate patient's response, side effects, and tolerance of medication regimen.  Therapeutic Interventions: 1 to 1 sessions, Unit Group sessions and Medication administration.  Evaluation of Outcomes: Progressing   RN Treatment Plan for Primary Diagnosis: Schizoaffective disorder, bipolar type (HCC) Long Term Goal(s): Knowledge of disease and therapeutic regimen to maintain health will improve  Short Term Goals: Ability to verbalize feelings will improve, Ability to identify and develop effective coping behaviors will improve and Compliance with prescribed medications will improve  Medication Management: RN will administer medications as ordered by provider, will assess and evaluate patient's response and provide education to patient for prescribed medication. RN will report any adverse and/or side effects to prescribing provider.  Therapeutic Interventions: 1 on 1 counseling sessions, Psychoeducation, Medication administration, Evaluate responses to treatment,  Monitor vital signs and CBGs as ordered, Perform/monitor CIWA, COWS, AIMS and Fall Risk screenings as ordered, Perform wound care treatments as ordered.  Evaluation of Outcomes: Progressing   LCSW  Treatment Plan for Primary Diagnosis: Schizoaffective disorder, bipolar type (HCC) Long Term Goal(s): Safe transition to appropriate next level of care at discharge, Engage patient in therapeutic group addressing interpersonal concerns.  Short Term Goals: Engage patient in aftercare planning with referrals and resources, Identify triggers associated with mental health/substance abuse issues and Increase skills for wellness and recovery  Therapeutic Interventions: Assess for all discharge needs, 1 to 1 time with Social worker, Explore available resources and support systems, Assess for adequacy in community support network, Educate family and significant other(s) on suicide prevention, Complete Psychosocial Assessment, Interpersonal group therapy.  Evaluation of Outcomes: Progressing   Progress in Treatment: Attending groups: Yes. Participating in groups: Yes. Taking medication as prescribed: Yes. Toleration medication: Yes. Family/Significant other contact made: No, will contact:    Patient understands diagnosis: Yes. Discussing patient identified problems/goals with staff: Yes. Medical problems stabilized or resolved: Yes. Denies suicidal/homicidal ideation: No. Issues/concerns per patient self-inventory: Yes., auditory hallucinations Other:    New problem(s) identified: No, Describe:     New Short Term/Long Term Goal(s): To get medications adjusted and make voices better so he can be discharge to friends.  Discharge Plan or Barriers: CSW still assessing for appropriate discharge plan.  [facility] in [place] and living with friends at discharge is likely.  Reason for Continuation of Hospitalization: Anxiety Depression Medication stabilization  Suicidal Ideation  Estimated Length of Stay:3-5 days  Recreational Therapy: Patient Stressors: Homelessness, depression Patient Goal: Patient will identify 3 triggers for depressive symptoms x5 days.    Attendees: Patient:Logan Hudson 06/23/2017 11:32 AM  Physician: Kristine LineaJolanta Pucilowska, MD 06/23/2017 11:32 AM  Nursing: Hulan AmatoGwen Farrish, RN 06/23/2017 11:32 AM  RN Care Manager: 06/23/2017 11:32 AM  Social Worker: Jake SharkSara Laws, LCSW 06/23/2017 11:32 AM  Recreational Therapist: Garret ReddishShay Kati Riggenbach, LRT 06/23/2017 11:32 AM  Other:  06/23/2017 11:32 AM  Other:  06/23/2017 11:32 AM  Other: 06/23/2017 11:32 AM    Scribe for Treatment Team: Glennon MacSara P Laws, LCSW 06/23/2017 11:32 AM

## 2017-06-23 NOTE — Progress Notes (Signed)
Nutrition Brief Note  Patient identified on the Malnutrition Screening Tool (MST) Report  Wt Readings from Last 15 Encounters:  06/22/17 242 lb (109.8 kg)  03/12/16 250 lb (113.4 kg)  03/12/16 280 lb (127 kg)    Body mass index is 32.82 kg/m. Patient meets criteria for obesity based on current BMI.   Current diet order is regular, patient is consuming approximately 100% of meals at this time. Labs and medications reviewed.   RD will order double protein with meals to help pt meet his estimated protein needs.   No further nutrition interventions warranted at this time. If nutrition issues arise, please consult RD.   Betsey Holidayasey Johnaton Sonneborn MS, RD, LDN Pager #- [phone] After Hours Pager: [phone]

## 2017-06-23 NOTE — Plan of Care (Signed)
Patient informed of [facility] Education on admission , daily programing, Working on Pharmacologistcoping skills . Patient voice no safety concerns . Unable to attend programing this shift . Patient attended  Treatment Team  this shift  information on plan of care . Progressing Education: Knowledge of [facility] Education information/materials will improve 06/23/2017 1158 - Progressing by Crist InfanteFarrish, Ishmeal Rorie A, RN Coping: Ability to verbalize frustrations and anger appropriately will improve 06/23/2017 1158 - Progressing by Crist InfanteFarrish, Mujtaba Bollig A, RN Safety: Periods of time without injury will increase 06/23/2017 1158 - Progressing by Crist InfanteFarrish, Manal Kreutzer A, RN

## 2017-06-23 NOTE — [facility] Suicide Risk Assessment (Signed)
[facility] INPATIENT:  Family/Significant Other Suicide Prevention Education  Suicide Prevention Education:  Patient Refusal for Family/Significant Other Suicide Prevention Education: The patient [name] has refused to provide written consent for family/significant other to be provided Family/Significant Other Suicide Prevention Education during admission and/or prior to discharge.  Physician notified.  Cleda DaubSara P Marcelline Temkin, LCSW 06/23/2017, 4:29 PM

## 2017-06-23 NOTE — Plan of Care (Signed)
Patient slept for Estimated Hours of 6.15; Precautionary checks every 15 minutes for safety maintained, room free of safety hazards, patient sustains no injury or falls during this shift.  

## 2017-06-23 NOTE — [facility] Group Notes (Signed)
06/23/2017 9:30AM  Type of Therapy/Topic:  Group Therapy:  Emotion Regulation  Participation Level:  Did Not Attend   Description of Group:   The purpose of this group is to assist patients in learning to regulate negative emotions and experience positive emotions. Patients will be guided to discuss ways in which they have been vulnerable to their negative emotions. These vulnerabilities will be juxtaposed with experiences of positive emotions or situations, and patients will be challenged to use positive emotions to combat negative ones. Special emphasis will be placed on coping with negative emotions in conflict situations, and patients will process healthy conflict resolution skills.  Therapeutic Goals: 1. Patient will identify two positive emotions or experiences to reflect on in order to balance out negative emotions 2. Patient will label two or more emotions that they find the most difficult to experience 3. Patient will demonstrate positive conflict resolution skills through discussion and/or role plays  Summary of Patient Progress: Patient was encouraged and invited to attend group. Patient did not attend group. Social worker will continue to encourage group participation in the future.       Therapeutic Modalities:   Cognitive Behavioral Therapy Feelings Identification Dialectical Behavioral Therapy   Johny ShearsCassandra  Aubry Rankin, LCSW 06/23/2017 10:24 AM

## 2017-06-23 NOTE — Progress Notes (Signed)
Recreation Therapy Notes   Date: 01.30 .2019  Time: 1:00 PM  Location: Craft Room  Behavioral response: N/A  Intervention Topic: Self-care  Discussion/Intervention: Patient did not attend group.  Clinical Observations/Feedback:  Patient did not attend group.  Maliya Marich LRT/CTRS          Amori Cooperman 06/23/2017 2:37 PM

## 2017-06-24 NOTE — Progress Notes (Signed)
Recreation Therapy Notes  Date: 01.31.2018  Time: 3:00pm  Location: Craft room  Behavioral response: N/A  Group Type: Craft  Participation level: N/A  Communication: Patient did not attend group.   Comments: N/A  Giorgi Debruin LRT/CTRS        Chene Kasinger 06/24/2017 4:18 PM 

## 2017-06-24 NOTE — Progress Notes (Signed)
Patient ID: Logan RiedelJames O Bong, male   DOB: 04-25-1966, 52 y.o.   MRN: [id number] Improved in appearance, improved mood and affect, denied pain, denied SI/HI/AVH, "I think the medications are working, I am much better .Marland Kitchen.Marland Kitchen.I don't have any feelings of hurting myself anymore."

## 2017-06-24 NOTE — Progress Notes (Signed)
[facility] MD Progress Note  06/24/2017 3:09 PM Logan Hudson  MRN:  [id number]  Subjective:   Logan Hudson seems slightly better today. He likes Seroquel over Zyprexa. He is still depressed and has passing suicidal thoughts but hallucinations are improving. Tolerates medications well. Feel "tired".   Treatment plan. We continue Seroquel 50 mg TID and 200 mg QHS with BuSpar.  Social/disposition. He will return to his friend's house. Follow up with [facility].  Principal Problem: Schizoaffective disorder, bipolar type (HCC) Diagnosis:   Patient Active Problem List   Diagnosis Date Noted  . Schizoaffective disorder, bipolar type (HCC) [F25.0] 06/22/2017    Priority: High  . Suicidal ideation [R45.851] 03/13/2016  . MDD (major depressive disorder), recurrent severe, without psychosis (HCC) [F33.2] 03/12/2016  . Cocaine abuse without complication (HCC) [F14.10] 05/23/2011   Total Time spent with patient: 20 minutes  Past Psychiatric History: schizoaffective disorder  Past Medical History:  Past Medical History:  Diagnosis Date  . Depression    History reviewed. No pertinent surgical history. Family History: History reviewed. No pertinent family history. Family Psychiatric  History: grandmother with depression/anxiety Social History:  Social History   Substance and Sexual Activity  Alcohol Use Yes   Comment: occasional     Social History   Substance and Sexual Activity  Drug Use Yes  . Types: Cocaine, Marijuana   Comment: daily use x 1 month    Social History   Socioeconomic History  . Marital status: Widowed    Spouse name: None  . Number of children: None  . Years of education: None  . Highest education level: None  Social Needs  . Financial resource strain: None  . Food insecurity - worry: None  . Food insecurity - inability: None  . Transportation needs - medical: None  . Transportation needs - non-medical: None  Occupational History  . None  Tobacco Use  . Smoking  status: Former Games developer  . Smokeless tobacco: Never Used  Substance and Sexual Activity  . Alcohol use: Yes    Comment: occasional  . Drug use: Yes    Types: Cocaine, Marijuana    Comment: daily use x 1 month  . Sexual activity: Yes  Other Topics Concern  . None  Social History Narrative  . None   Additional Social History:                         Sleep: Fair  Appetite:  Fair  Current Medications: Current Facility-Administered Medications  Medication Dose Route Frequency Provider Last Rate Last Dose  . acetaminophen (TYLENOL) tablet 650 mg  650 mg Oral Q6H PRN Jenavi Beedle B, MD      . alum & mag hydroxide-simeth (MAALOX/MYLANTA) 200-200-20 MG/5ML suspension 30 mL  30 mL Oral Q4H PRN Zyonna Vardaman B, MD      . busPIRone (BUSPAR) tablet 15 mg  15 mg Oral BID Madisen Ludvigsen B, MD   15 mg at 06/24/17 0831  . hydrOXYzine (ATARAX/VISTARIL) tablet 25 mg  25 mg Oral TID PRN Clyde Zarrella B, MD      . magnesium hydroxide (MILK OF MAGNESIA) suspension 30 mL  30 mL Oral Daily PRN Casanova Schurman B, MD      . pantoprazole (PROTONIX) EC tablet 40 mg  40 mg Oral Daily Cynitha Berte B, MD   40 mg at 06/24/17 0831  . QUEtiapine (SEROQUEL) tablet 200 mg  200 mg Oral QHS Raymon Schlarb B, MD   200  mg at 06/23/17 2104  . QUEtiapine (SEROQUEL) tablet 50 mg  50 mg Oral TID Naoki Migliaccio B, MD   50 mg at 06/24/17 1207  . traZODone (DESYREL) tablet 100 mg  100 mg Oral QHS Tannie Koskela B, MD   100 mg at 06/23/17 2104    Lab Results:  Results for orders placed or performed during the hospital encounter of 06/22/17 (from the past 48 hour(s))  Lipid panel     Status: None   Collection Time: 06/23/17  7:28 AM  Result Value Ref Range   Cholesterol 151 0 - 200 mg/dL   Triglycerides 96 <161 mg/dL   HDL 61 >09 mg/dL   Total CHOL/HDL Ratio 2.5 RATIO   VLDL 19 0 - 40 mg/dL   LDL Cholesterol 71 0 - 99 mg/dL    Comment:        Total  Cholesterol/HDL:CHD Risk Coronary Heart Disease Risk Table                     Men   Women  1/2 Average Risk   3.4   3.3  Average Risk       5.0   4.4  2 X Average Risk   9.6   7.1  3 X Average Risk  23.4   11.0        Use the calculated Patient Ratio above and the CHD Risk Table to determine the patient's CHD Risk.        ATP III CLASSIFICATION (LDL):  <100     mg/dL   Optimal  604-540  mg/dL   Near or Above                    Optimal  130-159  mg/dL   Borderline  981-191  mg/dL   High  >478     mg/dL   Very High Performed at [facility], [address] [place], [place] [address]   TSH     Status: None   Collection Time: 06/23/17  7:28 AM  Result Value Ref Range   TSH 1.645 0.350 - 4.500 uIU/mL    Comment: Performed by a 3rd Generation assay with a functional sensitivity of <=0.01 uIU/mL. Performed at [facility], [address] [place], [place] [address]   Hemoglobin A1c     Status: None   Collection Time: 06/23/17  7:28 AM  Result Value Ref Range   Hgb A1c MFr Bld 5.3 4.8 - 5.6 %    Comment: (NOTE) Pre diabetes:          5.7%-6.4% Diabetes:              >6.4% Glycemic control for   <7.0% adults with diabetes    Mean Plasma Glucose 105.41 mg/dL    Comment: Performed at [facility], [address], [place], [place] [address]    Blood Alcohol level:  Lab Results  Component Value Date   [facility] <5 03/12/2016   ETH <11 05/22/2011    Metabolic Disorder Labs: Lab Results  Component Value Date   HGBA1C 5.3 06/23/2017   MPG 105.41 06/23/2017   No results found for: PROLACTIN Lab Results  Component Value Date   CHOL 151 06/23/2017   TRIG 96 06/23/2017   HDL 61 06/23/2017   CHOLHDL 2.5 06/23/2017   VLDL 19 06/23/2017   LDLCALC 71 06/23/2017    Physical Findings: AIMS:  , ,  ,  ,  CIWA:    COWS:     Musculoskeletal: Strength & Muscle Tone: within normal limits Gait & Station: normal Patient leans: N/A  Psychiatric  Specialty Exam: Physical Exam  Nursing note and vitals reviewed. Psychiatric: His speech is normal. His affect is blunt. He is slowed, withdrawn and actively hallucinating. Cognition and memory are normal. He expresses impulsivity. He expresses suicidal ideation.    Review of Systems  Neurological: Negative.   Psychiatric/Behavioral: Positive for depression, hallucinations and suicidal ideas.  All other systems reviewed and are negative.   Blood pressure 108/76, pulse 78, temperature 97.6 F (36.4 C), temperature source Oral, resp. rate 20, height 6' (1.829 m), weight 109.8 kg (242 lb), SpO2 99 %.Body mass index is 32.82 kg/m.  General Appearance: Fairly Groomed  Eye Contact:  Good  Speech:  Clear and Coherent  Volume:  Normal  Mood:  Depressed and Hopeless  Affect:  Blunt  Thought Process:  Goal Directed and Descriptions of Associations: Intact  Orientation:  Full (Time, Place, and Person)  Thought Content:  Hallucinations: Auditory  Suicidal Thoughts:  Yes.  without intent/plan  Homicidal Thoughts:  No  Memory:  Immediate;   Fair Recent;   Fair Remote;   Fair  Judgement:  Poor  Insight:  Lacking  Psychomotor Activity:  Psychomotor Retardation  Concentration:  Concentration: Fair and Attention Span: Fair  Recall:  [employer]Fair  Fund of Knowledge:  Fair  Language:  Fair  Akathisia:  No  Handed:  Right  AIMS (if indicated):     Assets:  Communication Skills Desire for Improvement Physical Health Resilience Social Support  ADL's:  Intact  Cognition:  WNL  Sleep:  Number of Hours: 7.45     Treatment Plan Summary: Daily contact with patient to assess and evaluate symptoms and progress in treatment and Medication management   Logan Hudson is a 52 year old male with a history of schizoaffective disorder brought to the hospital by the police when he tried to step in front of the traffic.   #Suicidal ideation -patient able to contract for safety in the  hospital  #Mood -Seroquel 200 mg at night, 50 mg TID -Buspar 15 mg BID -Trazodone 100 mg nightly  #GERD -Protonix 40 mg daily  #Metabolic syndrome monitoring -Lipid panel TSH and HgbA1C are pending -EKG, pending  #Disposition -discharge to friend's house -[facility]    Clary Meeker, MD 06/24/2017, 3:09 PM

## 2017-06-24 NOTE — Plan of Care (Signed)
Patient slept for Estimated Hours of 7.45; Precautionary checks every 15 minutes for safety maintained, room free of safety hazards, patient sustains no injury or falls during this shift.  

## 2017-06-24 NOTE — Progress Notes (Signed)
Recreation Therapy Notes   Date: 01.31.2019  Time: 1:00pm   Location: Craft Room  Behavioral response: N/A  Intervention Topic: Communication  Discussion/Intervention: Patient did not attend group. Clinical Observations/Feedback:  Patient did not attend group.  Znya Albino LRT/CTRS          Pleshette Tomasini 06/24/2017 2:02 PM 

## 2017-06-24 NOTE — [facility] Group Notes (Signed)
[facility] Group Notes:  (Nursing/MHT/Case Management/Adjunct)  Date:  06/24/2017  Time:  9:52 PM  Type of Therapy:  Group Therapy  Participation Level:  Active  Participation Quality:  Appropriate  Affect:  Appropriate  Cognitive:  Appropriate  Insight:  Appropriate  Engagement in Group:  Engaged  Modes of Intervention:  Discussion  Summary of Progress/Problems:  Sylus Stgermain Marie Isidra Mings 06/24/2017, 9:52 PM 

## 2017-06-24 NOTE — [facility] Group Notes (Signed)
LCSW Group Therapy Note 06/24/2017 9:00 AM  Type of Therapy and Topic:  Group Therapy:  Setting Goals  Participation Level:  Did Not Attend  Description of Group: In this process group, patients discussed using strengths to work toward goals and address challenges.  Patients identified two positive things about themselves and one goal they were working on.  Patients were given the opportunity to share openly and support each other's plan for self-empowerment.  The group discussed the value of gratitude and were encouraged to have a daily reflection of positive characteristics or circumstances.  Patients were encouraged to identify a plan to utilize their strengths to work on current challenges and goals.  Therapeutic Goals 1. Patient will verbalize personal strengths/positive qualities and relate how these can assist with achieving desired personal goals 2. Patients will verbalize affirmation of peers plans for personal change and goal setting 3. Patients will explore the value of gratitude and positive focus as related to successful achievement of goals 4. Patients will verbalize a plan for regular reinforcement of personal positive qualities and circumstances.  Summary of Patient Progress:       Therapeutic Modalities Cognitive Behavioral Therapy Motivational Interviewing    Logan FrameSonya S Choya Tornow, LCSW 06/24/2017 10:50 AM

## 2017-06-24 NOTE — Progress Notes (Signed)
Received Logan Hudson this am in his room asleep, he was awaken for breakfast and medications. He verbalized feeling less depressed this am and denied feeling suicidal. He has been OOB in the milieu at intervals for meals, but mostly napping throughout the day.

## 2017-06-24 NOTE — [facility] Group Notes (Signed)
06/24/2017  Time: 0930  Type of Therapy/Topic:  Group Therapy:  Balance in Life  Participation Level:  Did Not Attend  Description of Group:   This group will address the concept of balance and how it feels and looks when one is unbalanced. Patients will be encouraged to process areas in their lives that are out of balance and identify reasons for remaining unbalanced. Facilitators will guide patients in utilizing problem-solving interventions to address and correct the stressor making their life unbalanced. Understanding and applying boundaries will be explored and addressed for obtaining and maintaining a balanced life. Patients will be encouraged to explore ways to assertively make their unbalanced needs known to significant others in their lives, using other group members and facilitator for support and feedback.  Therapeutic Goals: 1. Patient will identify two or more emotions or situations they have that consume much of in their lives. 2. Patient will identify signs/triggers that life has become out of balance:  3. Patient will identify two ways to set boundaries in order to achieve balance in their lives:  4. Patient will demonstrate ability to communicate their needs through discussion and/or role plays  Summary of Patient Progress: Pt was invited to attend group but chose not to attend. CSW will continue to encourage pt to attend group throughout their admission.   Therapeutic Modalities:   Cognitive Behavioral Therapy Solution-Focused Therapy Assertiveness Training  Heidi DachKelsey Octavian Godek, MSW, LCSW 06/24/2017 10:24 AM

## 2017-06-24 NOTE — [facility] Group Notes (Signed)
[facility] Group Notes:  (Nursing/MHT/Case Management/Adjunct)  Date:  06/24/2017  Time:  2:28 PM  Type of Therapy:  Psychoeducational Skills  Participation Level:  Did Not Attend  Logan Hudson Logan Hudson 06/24/2017, 2:28 PM 

## 2017-06-25 MED ORDER — QUETIAPINE FUMARATE 200 MG PO TABS: 200.0000 mg | ORAL_TABLET | Freq: Every day | ORAL | 1 refills | Status: DC

## 2017-06-25 MED ORDER — QUETIAPINE FUMARATE 50 MG PO TABS: 150.0000 mg | ORAL_TABLET | Freq: Every day | ORAL | 1 refills | Status: DC

## 2017-06-25 MED ORDER — QUETIAPINE FUMARATE 50 MG PO TABS: 50.0000 mg | ORAL_TABLET | Freq: Three times a day (TID) | ORAL | 1 refills | Status: DC

## 2017-06-25 MED ORDER — TRAZODONE HCL 100 MG PO TABS: 100.0000 mg | ORAL_TABLET | Freq: Every day | ORAL | 1 refills | Status: DC

## 2017-06-25 MED ORDER — BUSPIRONE HCL 15 MG PO TABS: 15.0000 mg | ORAL_TABLET | Freq: Two times a day (BID) | ORAL | 1 refills | Status: DC

## 2017-06-25 MED ORDER — HYDROXYZINE HCL 25 MG PO TABS: 25.0000 mg | ORAL_TABLET | Freq: Three times a day (TID) | ORAL | 0 refills | Status: DC | PRN

## 2017-06-25 NOTE — Progress Notes (Signed)
Recreation Therapy Notes  Date: 02.01.2019  Time: 1:00 PM  Location: Craft Room  Behavioral response: N/A  Intervention Topic: Leisure  Discussion/Intervention: Patient did not attend group.  Clinical Observations/Feedback:  Patient did not attend group. Virgia Kelner LRT/CTRS         Ladawn Boullion 06/25/2017 1:50 PM 

## 2017-06-25 NOTE — Progress Notes (Addendum)
[facility] MD Progress Note  06/25/2017 1:50 PM Logan Hudson  MRN:  [id number]  Subjective:   Logan Hudson denies suicidal or homicidal ideation. Auditory hallucinations have resolved. He is still depressed with poor energy, slumped in bed. He does not participate in programming.  Treatment plan. Continue Seroquel and BuSpar for depression, anxiety and psychosis.  Social/disposition. Discharge on Monday with friends. Follow up with [facility].  Principal Problem: Schizoaffective disorder, bipolar type (HCC) Diagnosis:   Patient Active Problem List   Diagnosis Date Noted  . Schizoaffective disorder, bipolar type (HCC) [F25.0] 06/22/2017    Priority: High  . Suicidal ideation [R45.851] 03/13/2016  . MDD (major depressive disorder), recurrent severe, without psychosis (HCC) [F33.2] 03/12/2016  . Cocaine abuse without complication (HCC) [F14.10] 05/23/2011   Total Time spent with patient: 20 minutes  Past Psychiatric History: schizoaffective disorder  Past Medical History:  Past Medical History:  Diagnosis Date  . Depression    History reviewed. No pertinent surgical history. Family History: History reviewed. No pertinent family history. Family Psychiatric  History: grandma with depression anxiety Social History:  Social History   Substance and Sexual Activity  Alcohol Use Yes   Comment: occasional     Social History   Substance and Sexual Activity  Drug Use Yes  . Types: Cocaine, Marijuana   Comment: daily use x 1 month    Social History   Socioeconomic History  . Marital status: Widowed    Spouse name: None  . Number of children: None  . Years of education: None  . Highest education level: None  Social Needs  . Financial resource strain: None  . Food insecurity - worry: None  . Food insecurity - inability: None  . Transportation needs - medical: None  . Transportation needs - non-medical: None  Occupational History  . None  Tobacco Use  . Smoking status: Former Games developer   . Smokeless tobacco: Never Used  Substance and Sexual Activity  . Alcohol use: Yes    Comment: occasional  . Drug use: Yes    Types: Cocaine, Marijuana    Comment: daily use x 1 month  . Sexual activity: Yes  Other Topics Concern  . None  Social History Narrative  . None   Additional Social History:                         Sleep: Fair  Appetite:  Fair  Current Medications: Current Facility-Administered Medications  Medication Dose Route Frequency Provider Last Rate Last Dose  . acetaminophen (TYLENOL) tablet 650 mg  650 mg Oral Q6H PRN Milia Warth B, MD      . alum & mag hydroxide-simeth (MAALOX/MYLANTA) 200-200-20 MG/5ML suspension 30 mL  30 mL Oral Q4H PRN Mckynzi Cammon B, MD      . busPIRone (BUSPAR) tablet 15 mg  15 mg Oral BID Irais Mottram B, MD   15 mg at 06/25/17 0806  . hydrOXYzine (ATARAX/VISTARIL) tablet 25 mg  25 mg Oral TID PRN Jacqui Headen B, MD      . magnesium hydroxide (MILK OF MAGNESIA) suspension 30 mL  30 mL Oral Daily PRN Valicia Rief B, MD      . pantoprazole (PROTONIX) EC tablet 40 mg  40 mg Oral Daily Alissa Pharr B, MD   40 mg at 06/25/17 0807  . QUEtiapine (SEROQUEL) tablet 200 mg  200 mg Oral QHS Amalia Edgecombe B, MD   200 mg at 06/24/17 2101  . QUEtiapine (  SEROQUEL) tablet 50 mg  50 mg Oral TID Geddy Boydstun B, MD   50 mg at 06/25/17 1225  . traZODone (DESYREL) tablet 100 mg  100 mg Oral QHS Lecil Tapp B, MD   100 mg at 06/24/17 2101    Lab Results: No results found for this or any previous visit (from the past 48 hour(s)).  Blood Alcohol level:  Lab Results  Component Value Date   [facility]   ETH <11 05/22/2011    Metabolic Disorder Labs: Lab Results  Component Value Date   HGBA1C 5.3 06/23/2017   MPG 105.41 06/23/2017   No results found for: PROLACTIN Lab Results  Component Value Date   CHOL 151 06/23/2017   TRIG 96 06/23/2017   HDL 61 06/23/2017    CHOLHDL 2.5 06/23/2017   VLDL 19 06/23/2017   LDLCALC 71 06/23/2017    Physical Findings: AIMS:  , ,  ,  ,    CIWA:    COWS:     Musculoskeletal: Strength & Muscle Tone: within normal limits Gait & Station: normal Patient leans: N/A  Psychiatric Specialty Exam: Physical Exam  Nursing note and vitals reviewed. Psychiatric: His speech is normal. Judgment and thought content normal. His affect is blunt. He is slowed and withdrawn. Cognition and memory are normal.    Review of Systems  Neurological: Negative.   Psychiatric/Behavioral: Positive for depression.  All other systems reviewed and are negative.   Blood pressure 103/60, pulse 84, temperature 98.2 F (36.8 C), temperature source Oral, resp. rate 18, height 6' (1.829 m), weight 109.8 kg (242 lb), SpO2 99 %.Body mass index is 32.82 kg/m.  General Appearance: Casual  Eye Contact:  Minimal  Speech:  Clear and Coherent  Volume:  Decreased  Mood:  Depressed and Worthless  Affect:  Blunt  Thought Process:  Goal Directed and Descriptions of Associations: Intact  Orientation:  Full (Time, Place, and Person)  Thought Content:  WDL  Suicidal Thoughts:  No  Homicidal Thoughts:  No  Memory:  Immediate;   Fair Recent;   Fair Remote;   Fair  Judgement:  Fair  Insight:  Present  Psychomotor Activity:  Psychomotor Retardation  Concentration:  Concentration: Fair and Attention Span: Fair  Recall:  [employer]Fair  Fund of Knowledge:  Fair  Language:  Fair  Akathisia:  No  Handed:  Right  AIMS (if indicated):     Assets:  Communication Skills Desire for Improvement Housing Physical Health Resilience Social Support  ADL's:  Intact  Cognition:  WNL  Sleep:  Number of Hours: 7.45     Treatment Plan Summary: Daily contact with patient to assess and evaluate symptoms and progress in treatment and Medication management   Logan Hudson is a 52 year old male with a history of schizoaffective disorder brought to the hospital by the police  when he tried to step in front of the traffic.   #Suicidal ideation, resolved -patient able to contract for safety in the hospital  #Mood -Seroquel 200 mg at night, 50 mg TID -Buspar 15 mg BID -Trazodone 100 mg nightly  #GERD -Protonix 40 mg daily  #Metabolic syndrome monitoring -Lipid panel TSH and HgbA1C are unremarkable -EKG, QTc 435  #Disposition -discharge to friend's house -[facility]     Tommie Dejoseph, MD 06/25/2017, 1:50 PM

## 2017-06-25 NOTE — [facility] Group Notes (Signed)
06/25/2017 9:30AM  Type of Therapy and Topic:  Group Therapy:  Feelings around Relapse and Recovery  Participation Level:  Did Not Attend   Description of Group:    Patients in this group will discuss emotions they experience before and after a relapse. They will process how experiencing these feelings, or avoidance of experiencing them, relates to having a relapse. Facilitator will guide patients to explore emotions they have related to recovery. Patients will be encouraged to process which emotions are more powerful. They will be guided to discuss the emotional reaction significant others in their lives may have to patients' relapse or recovery. Patients will be assisted in exploring ways to respond to the emotions of others without this contributing to a relapse.  Therapeutic Goals: 1. Patient will identify two or more emotions that lead to a relapse for them 2. Patient will identify two emotions that result when they relapse 3. Patient will identify two emotions related to recovery 4. Patient will demonstrate ability to communicate their needs through discussion and/or role plays   Summary of Patient Progress: Patient was encouraged and invited to attend group. Patient did not attend group. Social worker will continue to encourage group participation in the future.     Therapeutic Modalities:   Cognitive Behavioral Therapy Solution-Focused Therapy Assertiveness Training Relapse Prevention Therapy   Johny ShearsCassandra  Jayle Solarz, LCSW 06/25/2017 10:18 AM

## 2017-06-25 NOTE — Progress Notes (Signed)
  [facility] Discharge Plan :  Will you be returning to the same living situation after discharge:  No. At discharge, do you have transportation home?: Yes,  Pt will be using public transportation. Do you have the ability to pay for your medications: No.  Release of information consent forms completed and in the chart;  Patient's signature needed at discharge.  Patient to Follow up at: Follow-up Information    [facility]. Go on 07/26/2017.   Why:  Your appointment is scheduled for 06/28/17.  You may walk-in for your appointment between the hours of 8:00A-2:30PM.  Please bring a copy of her driver's license and social security card if you have them available. Contact information: [address] [place] [id number] [phone]           Next level of care provider has access to [facility] Link:no  Safety Planning and Suicide Prevention discussed: Yes,  No safety issues reported.  Have you used any form of tobacco in the last 30 days? (Cigarettes, Smokeless Tobacco, Cigars, and/or Pipes): No  Has patient been referred to the Quitline?: N/A patient is not a smoker  Patient has been referred for addiction treatment: N/A  Logan FrameSonya S Mischell Branford, LCSW 06/25/2017, 12:12 PM

## 2017-06-25 NOTE — Plan of Care (Signed)
Patient is alert and oriented X 4. Patient denies SI, AVH and HI. Patient is cooperative with calm affect. Patient is appropriate on the unit with peers and staff. Patient is compliant with medications and is able to verbalize uses for medications. Patient attends groups. Nurse will continue to monitor. Safety checks Q 15 minutes will continue.

## 2017-06-25 NOTE — Plan of Care (Signed)
  Progressing Education: Knowledge of [facility] [facility] Education information/materials will improve 06/25/2017 0116 - Progressing by Berkley Harveyeynolds, Terin Cragle I, RN Mental status will improve 06/25/2017 0116 - Progressing by Addison Naegelieynolds, Trenee Igoe I, RN Safety: Periods of time without injury will increase 06/25/2017 0116 - Progressing by Berkley Harveyeynolds, Jantzen Pilger I, RN

## 2017-06-25 NOTE — Progress Notes (Signed)

## 2017-06-26 NOTE — [facility] Group Notes (Signed)
LCSW Group Therapy Note  06/26/2017 1:15pm  Type of Therapy and Topic: Group Therapy: Holding on to Grudges   Participation Level: Did Not Attend   Description of Group:  In this group patients will be asked to explore and define a grudge. Patients will be guided to discuss their thoughts, feelings, and reasons as to why people have grudges. Patients will process the impact grudges have on daily life and identify thoughts and feelings related to holding grudges. Facilitator will challenge patients to identify ways to let go of grudges and the benefits this provides. Patients will be confronted to address why one struggles letting go of grudges. Lastly, patients will identify feelings and thoughts related to what life would look like without grudges. This group will be process-oriented, with patients participating in exploration of their own experiences, giving and receiving support, and processing challenge from other group members.  Therapeutic Goals:  1. Patient will identify specific grudges related to their personal life.  2. Patient will identify feelings, thoughts, and beliefs around grudges.  3. Patient will identify how one releases grudges appropriately.  4. Patient will identify situations where they could have let go of the grudge, but instead chose to hold on.   Summary of Patient Progress:   Therapeutic Modalities:  Cognitive Behavioral Therapy  Solution Focused Therapy  Motivational Interviewing  Brief Therapy   Logan Pracht  CUEBAS-COLON, LCSW 06/26/2017 12:49 PM 

## 2017-06-26 NOTE — B[facility] Group Notes (Signed)
[facility] Group Notes:  (Nursing/MHT/Case Management/Adjunct)  Date:  06/26/2017  Time:  11:44 PM  Type of Therapy:  Group Therapy  Participation Level:  Active  Participation Quality:  Supportive  Affect:  Appropriate  Cognitive:  Alert  Insight:  Good  Engagement in Group:  Engaged  Modes of Intervention:  Activity  Summary of Progress/Problems:  Mayra NeerJackie L Graciano Batson 06/26/2017, 11:44 PM

## 2017-06-26 NOTE — BHH Group Notes (Signed)
[facility] Group Notes:  (Nursing/MHT/Case Management/Adjunct)  Date:  06/26/2017  Time:  4:59 AM  Type of Therapy:  Psychoeducational Skills  Participation Level:  Did Not Attend  Summary of Progress/Problems:  Logan Hudson Y Cowan Pilar 06/26/2017, 4:59 AM 

## 2017-06-26 NOTE — Progress Notes (Signed)
[facility]  06/26/2017 11:18 AM Anette RiedelJames O Behrens  MRN:  [id number]  Subjective:   Mr. Nicholas LoseLomax is lying in bed.  He states he slept on and off, fair.  His mood is good.  No SI/HI/AVH.  He is looking forward to going home Monday.    Treatment plan. Continue Seroquel and BuSpar for depression, anxiety and psychosis.  Social/disposition. Discharge on Monday with friends. [facility]  Principal Problem: Schizoaffective disorder, bipolar type (HCC) Diagnosis:   Patient Active Problem List   Diagnosis Date Noted  . Schizoaffective disorder, bipolar type (HCC) [F25.0] 06/22/2017  . Suicidal ideation [R45.851] 03/13/2016  . MDD (major depressive disorder), recurrent severe, without psychosis (HCC) [F33.2] 03/12/2016  . Cocaine abuse without complication (HCC) [F14.10] 05/23/2011   Total Time spent with patient: 20 minutes  Past Psychiatric History: schizoaffective disorder  Past Medical History:  Past Medical History:  Diagnosis Date  . Depression    History reviewed. No pertinent surgical history. Family History: History reviewed. No pertinent family history. Family Psychiatric  History: grandma with depression anxiety Social History:  Social History   Substance and Sexual Activity  Alcohol Use Yes   Comment: occasional     Social History   Substance and Sexual Activity  Drug Use Yes  . Types: Cocaine, Marijuana   Comment: daily use x 1 month    Social History   Socioeconomic History  . Marital status: Widowed    Spouse name: None  . Number of children: None  . Years of education: None  . Highest education level: None  Social Needs  . Financial resource strain: None  . Food insecurity - worry: None  . Food insecurity - inability: None  . Transportation needs - medical: None  . Transportation needs - non-medical: None  Occupational History  . None  Tobacco Use  . Smoking status: Former Games developermoker  . Smokeless tobacco: Never Used  Substance and Sexual  Activity  . Alcohol use: Yes    Comment: occasional  . Drug use: Yes    Types: Cocaine, Marijuana    Comment: daily use x 1 month  . Sexual activity: Yes  Other Topics Concern  . None  Social History Narrative  . None   Additional Social History:                         Sleep: Fair  Appetite:  Fair  Current Medications: Current Facility-Administered Medications  Medication Dose Route Frequency Provider Last Rate Last Dose  . acetaminophen (TYLENOL) tablet 650 mg  650 mg Oral Q6H PRN Pucilowska, Jolanta B, MD      . alum & mag hydroxide-simeth (MAALOX/MYLANTA) 200-200-20 MG/5ML suspension 30 mL  30 mL Oral Q4H PRN Pucilowska, Jolanta B, MD      . busPIRone (BUSPAR) tablet 15 mg  15 mg Oral BID Pucilowska, Jolanta B, MD   15 mg at 06/26/17 0816  . hydrOXYzine (ATARAX/VISTARIL) tablet 25 mg  25 mg Oral TID PRN Pucilowska, Jolanta B, MD      . magnesium hydroxide (MILK OF MAGNESIA) suspension 30 mL  30 mL Oral Daily PRN Pucilowska, Jolanta B, MD      . pantoprazole (PROTONIX) EC tablet 40 mg  40 mg Oral Daily Pucilowska, Jolanta B, MD   40 mg at 06/26/17 0816  . QUEtiapine (SEROQUEL) tablet 200 mg  200 mg Oral QHS Pucilowska, Jolanta B, MD   200 mg at 06/25/17 2101  .  QUEtiapine (SEROQUEL) tablet 50 mg  50 mg Oral TID Pucilowska, Jolanta B, MD   50 mg at 06/26/17 0816  . traZODone (DESYREL) tablet 100 mg  100 mg Oral QHS Pucilowska, Jolanta B, MD   100 mg at 06/25/17 2101    Lab Results: No results found for this or any previous visit (from the past 48 hour(s)).  Blood Alcohol level:  Lab Results  Component Value Date   [facility] <5 03/12/2016   ETH <11 05/22/2011    Metabolic Disorder Labs: Lab Results  Component Value Date   HGBA1C 5.3 06/23/2017   MPG 105.41 06/23/2017   No results found for: PROLACTIN Lab Results  Component Value Date   CHOL 151 06/23/2017   TRIG 96 06/23/2017   HDL 61 06/23/2017   CHOLHDL 2.5 06/23/2017   VLDL 19 06/23/2017   LDLCALC 71  06/23/2017    Physical Findings: AIMS:  , ,  ,  ,    CIWA:    COWS:     Musculoskeletal: Strength & Muscle Tone: within normal limits Gait & Station: normal Patient leans: N/A  Psychiatric Specialty Exam: Physical Exam  Nursing note and vitals reviewed. Psychiatric: His speech is normal. Judgment and thought content normal. His affect is blunt. He is slowed and withdrawn. Cognition and memory are normal.    Review of Systems  Neurological: Negative.   Psychiatric/Behavioral: Positive for depression.  All other systems reviewed and are negative.   Blood pressure 100/78, pulse 100, temperature 98.2 F (36.8 C), temperature source Oral, resp. rate 18, height 6' (1.829 m), weight 109.8 kg (242 lb), SpO2 99 %.Body mass index is 32.82 kg/m.  General Appearance: Casual  Eye Contact:  Minimal  Speech:  Clear and Coherent  Volume:  Decreased  Mood:  "fine"  Affect:  Blunt  Thought Process:  Goal Directed and Descriptions of Associations: Intact  Orientation:  Full (Time, Place, and Person)  Thought Content:  WDL  Suicidal Thoughts:  No  Homicidal Thoughts:  No  Memory:  Immediate;   Fair Recent;   Fair Remote;   Fair  Judgement:  Fair  Insight:  Present  Psychomotor Activity:  Psychomotor Retardation  Concentration:  Concentration: Fair and Attention Span: Fair  Recall:  [employer] of Knowledge:  Fair  Language:  Fair  Akathisia:  No  Handed:  Right  AIMS (if indicated):     Assets:  Communication Skills Desire for Improvement Housing Physical Health Resilience Social Support  ADL's:  Intact  Cognition:  WNL  Sleep:  Number of Hours: 7.45     Treatment Plan Summary: Daily contact with patient to assess and evaluate symptoms and progress in treatment and Medication management   Mr. Nieto is a 52 year old male with a history of schizoaffective disorder brought to the hospital by the police when he tried to step in front of the traffic.   #Suicidal ideation,  resolved -patient able to contract for safety in the hospital  #Mood -Seroquel 200 mg at night, 50 mg TID -Buspar 15 mg BID -Trazodone 100 mg nightly  #GERD -Protonix 40 mg daily  #Metabolic syndrome monitoring -Lipid panel TSH and HgbA1C are unremarkable -EKG, QTc 435  #Disposition -discharge to friend's house -follow up with Newton Pigg, MD 06/26/2017, 11:18 AM

## 2017-06-26 NOTE — Plan of Care (Signed)
Patient is alert and oriented; Denies SI, HI and AVH. Patient affect is pleasant and cooperative. Patient states he feels much better and is looking forward to discharge. Patient attends groups and is appropriate on the unit with staff and peers. Patient expressed no concerns today. Nurse will continue to monitor. Activity: Interest or engagement in activities will improve 06/26/2017 0917 - Progressing by Leamon ArntPowell, Kullen Tomasetti K, RN Sleeping patterns will improve 06/26/2017 0917 - Progressing by Leamon ArntPowell, Xzaviar Maloof K, RN   Education: Knowledge of [place] [facility] Education information/materials will improve 06/26/2017 [phone] - Progressing by Leamon ArntPowell, Judyann Casasola K, RN Emotional status will improve 06/26/2017 0917 - Progressing by Leamon ArntPowell, Alanna Storti K, RN Mental status will improve 06/26/2017 0917 - Progressing by Leamon ArntPowell, Volney Reierson K, RN   Coping: Ability to verbalize frustrations and anger appropriately will improve 06/26/2017 0917 - Progressing by Leamon ArntPowell, Darya Bigler K, RN

## 2017-06-27 NOTE — B[facility] Group Notes (Signed)
LCSW Group Therapy Note 06/27/2017 1:15pm  Type of Therapy and Topic: Group Therapy: Feelings Around Returning Home & Establishing a Supportive Framework and Supporting Oneself When Supports Not Available  Participation Level: Did Not Attend  Description of Group:  Patients first processed thoughts and feelings about upcoming discharge. These included fears of upcoming changes, lack of change, new living environments, judgements and expectations from others and overall stigma of mental health issues. The group then discussed the definition of a supportive framework, what that looks and feels like, and how do to discern it from an unhealthy non-supportive network. The group identified different types of supports as well as what to do when your family/friends are less than helpful or unavailable  Therapeutic Goals  1. Patient will identify one healthy supportive network that they can use at discharge. 2. Patient will identify one factor of a supportive framework and how to tell it from an unhealthy network. 3. Patient able to identify one coping skill to use when they do not have positive supports from others. 4. Patient will demonstrate ability to communicate their needs through discussion and/or role plays.  Summary of Patient Progress:  Patient was encouraged and invited to attend group. Patient did not attend group. Social worker will continue to encourage group participation in the future.    Therapeutic Modalities Cognitive Behavioral Therapy Motivational Interviewing   Logan Hudson  CUEBAS-COLON, LCSW 06/27/2017 11:59 AM 

## 2017-06-27 NOTE — Plan of Care (Signed)
Pt has been isolative to self. Pt is medication compliant. No behavioral issue noted. Remains on Q 15 mins safety rounds.  Progressing Activity: Interest or engagement in activities will improve 06/27/2017 0651 - Progressing by Merlene PullingBrigman, Mikala Podoll A, RN Sleeping patterns will improve 06/27/2017 0651 - Progressing by Merlene PullingBrigman, Chun Sellen A, RN Education: Knowledge of [facility] Education information/materials will improve 06/27/2017 [phone] - Progressing by Merlene PullingBrigman, Rudolf Blizard A, RN Emotional status will improve 06/27/2017 0651 - Progressing by Merlene PullingBrigman, Bennetta Rudden A, RN Mental status will improve 06/27/2017 0651 - Progressing by Merlene PullingBrigman, Jashae Wiggs A, RN Coping: Ability to verbalize frustrations and anger appropriately will improve 06/27/2017 0651 - Progressing by Merlene PullingBrigman, Silvia Hightower A, RN Health Behavior/Discharge Planning: Compliance with treatment plan for underlying cause of condition will improve 06/27/2017 0651 - Progressing by Merlene PullingBrigman, Veniamin Kincaid A, RN Safety: Periods of time without injury will increase 06/27/2017 0651 - Progressing by Merlene PullingBrigman, Hakeem Frazzini A, RN Activity: Interest or engagement in leisure activities will improve 06/27/2017 0651 - Progressing by Merlene PullingBrigman, Sueko Dimichele A, RN Self-Concept: Ability to verbalize positive feelings about self will improve 06/27/2017 0651 - Progressing by Merlene PullingBrigman, Yolanda Huffstetler A, RN Level of anxiety will decrease 06/27/2017 0651 - Progressing by Merlene PullingBrigman, Hina Gupta A, RN Safety: Verbalization of understanding the information provided will improve 06/27/2017 0651 - Progressing by Merlene PullingBrigman, Ahren Pettinger A, RN Self-Concept: Level of anxiety will decrease 06/27/2017 0651 - Progressing by Merlene PullingBrigman, Jacqualin Shirkey A, RN [facility] Participation in Recreation Therapeutic Interventions STG-Other Recreation Therapy Goal (Specify) Description Patient will identify 3 triggers for depressive symptoms x5 days.   06/27/2017 [phone] - Progressing by Merlene PullingBrigman, Jashiya Bassett A, RN Safety: Ability to remain free from injury will improve 06/27/2017 0651 - Progressing by  Merlene PullingBrigman, Janalynn Eder A, RN Spiritual Needs Ability to function at adequate level 06/27/2017 0651 - Progressing by Merlene PullingBrigman, Olando Willems A, RN

## 2017-06-27 NOTE — Plan of Care (Signed)
  Progressing Activity: Interest or engagement in activities will improve 06/27/2017 2024 - Progressing by Merlene PullingBrigman, Hanford Lust A, RN 06/27/2017 [phone] - Progressing by Merlene PullingBrigman, Shakari Qazi A, RN Sleeping patterns will improve 06/27/2017 2024 - Progressing by Merlene PullingBrigman, Kelci Petrella A, RN 06/27/2017 [phone] - Progressing by Merlene PullingBrigman, Aedon Deason A, RN Education: Knowledge of [facility] Education information/materials will improve 06/27/2017 2024 - Progressing by Merlene PullingBrigman, Layth Cerezo A, RN 06/27/2017 [phone] - Progressing by Merlene PullingBrigman, Dexton Zwilling A, RN Emotional status will improve 06/27/2017 2024 - Progressing by Merlene PullingBrigman, Oren Barella A, RN 06/27/2017 [phone] - Progressing by Merlene PullingBrigman, Hermila Millis A, RN Mental status will improve 06/27/2017 2024 - Progressing by Merlene PullingBrigman, Zeek Rostron A, RN 06/27/2017 [phone] - Progressing by Merlene PullingBrigman, Olajuwon Fosdick A, RN Coping: Ability to verbalize frustrations and anger appropriately will improve 06/27/2017 2024 - Progressing by Merlene PullingBrigman, Jozef Eisenbeis A, RN 06/27/2017 [phone] - Progressing by Merlene PullingBrigman, Randye Treichler A, RN Health Behavior/Discharge Planning: Compliance with treatment plan for underlying cause of condition will improve 06/27/2017 2024 - Progressing by Merlene PullingBrigman, Mal Asher A, RN 06/27/2017 [phone] - Progressing by Merlene PullingBrigman, Valyn Latchford A, RN Safety: Periods of time without injury will increase 06/27/2017 2024 - Progressing by Merlene PullingBrigman, Sinthia Karabin A, RN 06/27/2017 [phone] - Progressing by Merlene PullingBrigman, Charmane Protzman A, RN Activity: Interest or engagement in leisure activities will improve 06/27/2017 2024 - Progressing by Merlene PullingBrigman, Makensey Rego A, RN 06/27/2017 [phone] - Progressing by Merlene PullingBrigman, Macsen Nuttall A, RN Self-Concept: Ability to verbalize positive feelings about self will improve 06/27/2017 2024 - Progressing by Merlene PullingBrigman, Yanis Juma A, RN 06/27/2017 [phone] - Progressing by Merlene PullingBrigman, Taytem Ghattas A, RN Level of anxiety will decrease 06/27/2017 2024 - Progressing by Merlene PullingBrigman, Floride Hutmacher A, RN 06/27/2017 [phone] - Progressing by Merlene PullingBrigman, Avianna Moynahan A, RN Safety: Verbalization of understanding the information provided will improve 06/27/2017 2024 - Progressing by  Merlene PullingBrigman, Sha Amer A, RN 06/27/2017 [phone] - Progressing by Merlene PullingBrigman, Lennis Korb A, RN Self-Concept: Level of anxiety will decrease 06/27/2017 2024 - Progressing by Merlene PullingBrigman, Kariyah Baugh A, RN 06/27/2017 [phone] - Progressing by Merlene PullingBrigman, Chasitty Hehl A, RN [facility] Participation in Recreation Therapeutic Interventions STG-Other Recreation Therapy Goal (Specify) Description Patient will identify 3 triggers for depressive symptoms x5 days.   06/27/2017 2024 - Progressing by Merlene PullingBrigman, Talishia Betzler A, RN 06/27/2017 [phone] - Progressing by Merlene PullingBrigman, Sharona Rovner A, RN Safety: Ability to remain free from injury will improve 06/27/2017 2024 - Progressing by Merlene PullingBrigman, Maleia Weems A, RN 06/27/2017 [phone] - Progressing by Merlene PullingBrigman, Gaylen Pereira A, RN Spiritual Needs Ability to function at adequate level 06/27/2017 2024 - Progressing by Merlene PullingBrigman, Jamille Fisher A, RN 06/27/2017 [phone] - Progressing by Merlene PullingBrigman, Mella Inclan A, RN

## 2017-06-27 NOTE — Progress Notes (Signed)
[facility] MD Progress Note  06/27/2017 12:36 PM Logan [name]  MRN:  [id number]  Subjective:   Logan Hudson is lying in bed.  Again he says his sleep is on and off.  He is depressed about going home tomorrow.  He is worried his friend will kick him out and he will have no where to go.  He has stayed in shelters before but states many of them are full.  No SI/HI/AVH.    Treatment plan. Continue Seroquel and BuSpar for depression, anxiety and psychosis.  Social/disposition. Discharge on Monday with friends. Follow up with [facility].  Principal Problem: Schizoaffective disorder, bipolar type (HCC) Diagnosis:   Patient Active Problem List   Diagnosis Date Noted  . Schizoaffective disorder, bipolar type (HCC) [F25.0] 06/22/2017  . Suicidal ideation [R45.851] 03/13/2016  . MDD (major depressive disorder), recurrent severe, without psychosis (HCC) [F33.2] 03/12/2016  . Cocaine abuse without complication (HCC) [F14.10] 05/23/2011   Total Time spent with patient: 20 minutes  Past Psychiatric History: schizoaffective disorder  Past Medical History:  Past Medical History:  Diagnosis Date  . Depression    History reviewed. No pertinent surgical history. Family History: History reviewed. No pertinent family history. Family Psychiatric  History: grandma with depression anxiety Social History:  Social History   Substance and Sexual Activity  Alcohol Use Yes   Comment: occasional     Social History   Substance and Sexual Activity  Drug Use Yes  . Types: Cocaine, Marijuana   Comment: daily use x 1 month    Social History   Socioeconomic History  . Marital status: Widowed    Spouse name: None  . Number of children: None  . Years of education: None  . Highest education level: None  Social Needs  . Financial resource strain: None  . Food insecurity - worry: None  . Food insecurity - inability: None  . Transportation needs - medical: None  . Transportation needs - non-medical: None   Occupational History  . None  Tobacco Use  . Smoking status: Former Games developer  . Smokeless tobacco: Never Used  Substance and Sexual Activity  . Alcohol use: Yes    Comment: occasional  . Drug use: Yes    Types: Cocaine, Marijuana    Comment: daily use x 1 month  . Sexual activity: Yes  Other Topics Concern  . None  Social History Narrative  . None   Additional Social History:                         Sleep: Fair  Appetite:  Fair  Current Medications: Current Facility-Administered Medications  Medication Dose Route Frequency Provider Last Rate Last Dose  . acetaminophen (TYLENOL) tablet 650 mg  650 mg Oral Q6H PRN Pucilowska, Jolanta B, MD      . alum & mag hydroxide-simeth (MAALOX/MYLANTA) 200-200-20 MG/5ML suspension 30 mL  30 mL Oral Q4H PRN Pucilowska, Jolanta B, MD      . busPIRone (BUSPAR) tablet 15 mg  15 mg Oral BID Pucilowska, Jolanta B, MD   15 mg at 06/27/17 0808  . hydrOXYzine (ATARAX/VISTARIL) tablet 25 mg  25 mg Oral TID PRN Pucilowska, Jolanta B, MD   25 mg at 06/26/17 2111  . magnesium hydroxide (MILK OF MAGNESIA) suspension 30 mL  30 mL Oral Daily PRN Pucilowska, Jolanta B, MD      . pantoprazole (PROTONIX) EC tablet 40 mg  40 mg Oral Daily Pucilowska, Jolanta B, MD  40 mg at 06/27/17 0808  . QUEtiapine (SEROQUEL) tablet 200 mg  200 mg Oral QHS Pucilowska, Jolanta B, MD   200 mg at 06/26/17 2111  . QUEtiapine (SEROQUEL) tablet 50 mg  50 mg Oral TID Pucilowska, Jolanta B, MD   50 mg at 06/27/17 0808  . traZODone (DESYREL) tablet 100 mg  100 mg Oral QHS Pucilowska, Jolanta B, MD   100 mg at 06/26/17 2111    Lab Results: No results found for this or any previous visit (from the past 48 hour(s)).  Blood Alcohol level:  Lab Results  Component Value Date   [facility] <5 03/12/2016   ETH <11 05/22/2011    Metabolic Disorder Labs: Lab Results  Component Value Date   HGBA1C 5.3 06/23/2017   MPG 105.41 06/23/2017   No results found for: PROLACTIN Lab  Results  Component Value Date   CHOL 151 06/23/2017   TRIG 96 06/23/2017   HDL 61 06/23/2017   CHOLHDL 2.5 06/23/2017   VLDL 19 06/23/2017   LDLCALC 71 06/23/2017    Physical Findings: AIMS:  , ,  ,  ,    CIWA:    COWS:     Musculoskeletal: Strength & Muscle Tone: within normal limits Gait & Station: normal Patient leans: N/A  Psychiatric Specialty Exam: Physical Exam  Nursing note and vitals reviewed. Psychiatric: His speech is normal. Judgment and thought content normal. His affect is blunt. He is slowed and withdrawn. Cognition and memory are normal.    Review of Systems  Neurological: Negative.   Psychiatric/Behavioral: Positive for depression.  All other systems reviewed and are negative.   Blood pressure (!) 106/51, pulse 88, temperature 98.2 F (36.8 C), temperature source Oral, resp. rate 18, height 6' (1.829 m), weight 109.8 kg (242 lb), SpO2 97 %.Body mass index is 32.82 kg/m.  General Appearance: Casual  Eye Contact:  Minimal  Speech:  Clear and Coherent  Volume:  Decreased  Mood:  Anxious  Affect:  Blunt  Thought Process:  Goal Directed and Descriptions of Associations: Intact  Orientation:  Full (Time, Place, and Person)  Thought Content:  WDL  Suicidal Thoughts:  No  Homicidal Thoughts:  No  Memory:  Immediate;   Fair Recent;   Fair Remote;   Fair  Judgement:  Fair  Insight:  Present  Psychomotor Activity:  Psychomotor Retardation  Concentration:  Concentration: Fair and Attention Span: Fair  Recall:  [employer] of Knowledge:  Fair  Language:  Fair  Akathisia:  No  Handed:  Right  AIMS (if indicated):     Assets:  Communication Skills Desire for Improvement Housing Physical Health Resilience Social Support  ADL's:  Intact  Cognition:  WNL  Sleep:  Number of Hours: 8.3     Treatment Plan Summary: Daily contact with patient to assess and evaluate symptoms and progress in treatment and Medication management   Logan Hudson is a  52 year old male with a history of schizoaffective disorder brought to the hospital by the police when he tried to step in front of the traffic.   #Suicidal ideation, resolved -patient able to contract for safety in the hospital  #Mood -Seroquel 200 mg at night, 50 mg TID -Buspar 15 mg BID -Trazodone 100 mg nightly  #GERD -Protonix 40 mg daily  #Metabolic syndrome monitoring -Lipid panel TSH and HgbA1C are unremarkable -EKG, QTc 435  #Disposition -discharge to friend's house likely tomorrow  -follow up with Newton Pigg, MD  06/27/2017, 12:36 PM

## 2017-06-27 NOTE — Plan of Care (Signed)
Patient is alert and oriented X 4. Patient denies SI, HI and AVH. Patient states he is a little depressed this morning because he will be discharged tomorrow. Patient stated he does not know if he is ready. Nurse offered emotional support, talked about coping skills and positive thinking. Patient continues to be appropriate to staff and peers on the unit. Patient is compliant with treatment and medications. Nurse will continue to monitor. Activity: Interest or engagement in activities will improve 06/27/2017 1003 - Progressing by Leamon ArntPowell, Marquay Kruse K, RN Sleeping patterns will improve 06/27/2017 1003 - Progressing by Leamon ArntPowell, Bianco Cange K, RN   Education: Knowledge of [facility] Education information/materials will improve 06/27/2017 1003 - Progressing by Leamon ArntPowell, Montgomery Rothlisberger K, RN Emotional status will improve 06/27/2017 1003 - Progressing by Leamon ArntPowell, Jarryd Gratz K, RN Mental status will improve 06/27/2017 1003 - Progressing by Leamon ArntPowell, Cavin Longman K, RN   Coping: Ability to verbalize frustrations and anger appropriately will improve 06/27/2017 1003 - Progressing by Leamon ArntPowell, Hazel Wrinkle K, RN

## 2017-06-28 NOTE — Progress Notes (Signed)
Recreation Therapy Notes  INPATIENT RECREATION TR PLAN  Patient Details Name: Praveen Coia. MRN: [id number] DOB: 10/31/1965 Today's Date: 06/28/2017  Rec Therapy Plan Is patient appropriate for Therapeutic Recreation?: Yes Treatment times per week: at least 3 Estimated Length of Stay: 5-7 days TR Treatment/Interventions: Group participation (Comment)  Discharge Criteria Pt will be discharged from therapy if:: Discharged Treatment plan/goals/alternatives discussed and agreed upon by:: Patient/family  Discharge Summary Short term goals set: Patient will identify 3 triggers for depressive symptoms x5 days.  Short term goals met: Not met Progress toward goals comments: (None) Reason goals not met: Patient did not attend any groups. Therapeutic equipment acquired: N/A Reason patient discharged from therapy: Discharge from hospital Pt/family agrees with progress & goals achieved: Yes Date patient discharged from therapy: 06/28/17   Azelie Noguera 06/28/2017, 2:08 PM

## 2017-06-28 NOTE — Progress Notes (Signed)
Patient denies SI/HI, denies A/V hallucinations. Patient verbalizes understanding of discharge instructions, follow up care and prescriptions.7 days medicines given to patient.Patient given all belongings from  locker. Patient escorted out by staff, transported by courtesy cab bus stop.

## 2017-06-28 NOTE — [facility] Suicide Risk Assessment (Signed)
[facility]   Principal Problem: Schizoaffective disorder, bipolar type [facility](HCC) Discharge Diagnoses:  Patient Active Problem List   Diagnosis Date Noted  . Schizoaffective disorder, bipolar type (HCC) [F25.0] 06/22/2017    Priority: High  . Suicidal ideation [R45.851] 03/13/2016  . MDD (major depressive disorder), recurrent severe, without psychosis (HCC) [F33.2] 03/12/2016  . Cocaine abuse without complication (HCC) [F14.10] 05/23/2011    Total Time spent with patient: 30 minutes  Musculoskeletal: Strength & Muscle Tone: within normal limits Gait & Station: normal Patient leans: N/A  Psychiatric Specialty Exam: Review of Systems  Neurological: Negative.   Psychiatric/Behavioral: Negative.   All other systems reviewed and are negative.   Blood pressure 99/60, pulse 93, temperature 97.7 F (36.5 C), temperature source Oral, resp. rate 18, height 6' (1.829 m), weight 109.8 kg (242 lb), SpO2 98 %.Body mass index is 32.82 kg/m.  General Appearance: Casual  Eye Contact::  Good  Speech:  Clear and Coherent409  Volume:  Normal  Mood:  Anxious  Affect:  Appropriate  Thought Process:  Goal Directed and Descriptions of Associations: Intact  Orientation:  Full (Time, Place, and Person)  Thought Content:  WDL  Suicidal Thoughts:  No  Homicidal Thoughts:  No  Memory:  Immediate;   Fair Recent;   Fair Remote;   Fair  Judgement:  Impaired  Insight:  Shallow  Psychomotor Activity:  Normal  Concentration:  Fair  Recall:  [employer]  Fund of Knowledge:Fair  Language: Fair  Akathisia:  No  Handed:  Right  AIMS (if indicated):     Assets:  Communication Skills Desire for Improvement Physical Health Resilience Social Support  Sleep:  Number of Hours: 7  Cognition: WNL  ADL's:  Intact   Mental Status Per Nursing Assessment::   On Admission:     Demographic Factors:  Male, Divorced or widowed, Caucasian, Low socioeconomic status and Unemployed  Loss  Factors: Financial problems/change in socioeconomic status  Historical Factors: Prior suicide attempts, Family history of mental illness or substance abuse and Impulsivity  Risk Reduction Factors:   Sense of responsibility to family, Positive social support and Positive therapeutic relationship  Continued Clinical Symptoms:  Schizophrenia:   Depressive state  Cognitive Features That Contribute To Risk:  None    Suicide Risk:  Minimal: No identifiable suicidal ideation.  Patients presenting with no risk factors but with morbid ruminations; may be classified as minimal risk based on the severity of the depressive symptoms  Follow-up Information    Services, [facility]. Go on 07/26/2017.   Why:  Your appointment is scheduled for 06/28/17.  You may walk-in for your appointment between the hours of 8:00A-2:30PM.  Please bring a copy of her driver's license and social security card if you have them available. Contact information: [address] [place] [id number] [phone]           Plan Of Care/Follow-up recommendations:  Activity:  as tolerated Diet:  low sodium heart healthy Other:  keep follow up appointments  Kristine LineaJolanta Affan Callow, MD 06/28/2017, 7:56 AM

## 2017-06-28 NOTE — Progress Notes (Signed)
Pt has been visible in mileu throughout this shift. Pt c/o feeling anxious. Pt given PRN per MAR. Medication was effective. Pt is medication complaint. Pt denies pain at this time. Remains on Q 15 mins safety rounds. Pt denies ah/vh/si/hi thus far this shift. Pt is calm and cooperative. Will cont to monitor pt.

## 2017-06-28 NOTE — [facility] Group Notes (Signed)
06/28/2017  Time: 0930   Type of Therapy and Topic:  Group Therapy:  Overcoming Obstacles   Participation Level:  Did Not Attend   Description of Group:   In this group patients will be encouraged to explore what they see as obstacles to their own wellness and recovery. They will be guided to discuss their thoughts, feelings, and behaviors related to these obstacles. The group will process together ways to cope with barriers, with attention given to specific choices patients can make. Each patient will be challenged to identify changes they are motivated to make in order to overcome their obstacles. This group will be process-oriented, with patients participating in exploration of their own experiences, giving and receiving support, and processing challenge from other group members.   Therapeutic Goals: 1. Patient will identify personal and current obstacles as they relate to admission. 2. Patient will identify barriers that currently interfere with their wellness or overcoming obstacles.  3. Patient will identify feelings, thought process and behaviors related to these barriers. 4. Patient will identify two changes they are willing to make to overcome these obstacles:      Summary of Patient Progress  Pt was invited to attend group but chose not to attend. CSW will continue to encourage pt to attend group throughout their admission.     Therapeutic Modalities:   Cognitive Behavioral Therapy Solution Focused Therapy Motivational Interviewing Relapse Prevention Therapy  Heidi DachKelsey Bernell Sigal, MSW, LCSW 06/28/2017 10:18 AM

## 2017-06-28 NOTE — Discharge Summary (Addendum)
Physician Discharge Summary Note  Patient:  Logan [name] is an 52 y.o., male MRN:  [id number] DOB:  [birth year] Patient phone:  [phone] (home)  Patient address:   [place] [address],  Total Time spent with patient: 30 minutes  Date of Admission:  06/22/2017 Date of Discharge: 06/28/2017  Reason for Admission:  Psychotic break  History of Present Illness:   Identifying data. Mr. Vanderpol is a 52 year old male with a history of schizoaffective disorder.  Chief complaint. "I don;t have energy."  History of present illness. Information was obtained from the patient and the chart. The patient was brought to [facility] by the police after an officer noticed that the patient was trying to step in front of traffic. The patient reports that since he was switched from Seroquel to Zyprexa by his psychiatrist at [facility], he becam increasingly depressed with poor sleep, decreased appetite, anhedonia, feeling of hopelessness helplessness and guilt, [poor energy and concentration, social isolation and crying spells that culminated in suicide attempt. He reports heightened anxiety with frequent panic attacks and auditory command hallucinations. He has been free of cocaine for 3 weeks and compliant with medications.  Past psychiatric history. Diagnosed with bipolar and schizophrenia. Multiple suicide attempts, medication trials and hospitalizations. Reports that in the past 4 years, he has been to [facility] ER over 20 times.  Family psychiatric history. Grandmother with depression and anxiety.  Social history. He is separated from his wife. He came to [place] several years ago following a women. He is unemployed and uninsured. Applied for disability. Lives with friends for free but they needed him out of the house for a few days.  Principal Problem: Schizoaffective disorder, bipolar type [facility]) Discharge Diagnoses: Patient Active Problem List   Diagnosis Date Noted  .  Schizoaffective disorder, bipolar type (HCC) [F25.0] 06/22/2017    Priority: High  . Suicidal ideation [R45.851] 03/13/2016  . MDD (major depressive disorder), recurrent severe, without psychosis (HCC) [F33.2] 03/12/2016  . Cocaine abuse without complication [facility]) [F14.10] 05/23/2011   Past Medical History:  Past Medical History:  Diagnosis Date  . Depression    History reviewed. No pertinent surgical history. Family History: History reviewed. No pertinent family history.  Social History:  Social History   Substance and Sexual Activity  Alcohol Use Yes   Comment: occasional     Social History   Substance and Sexual Activity  Drug Use Yes  . Types: Cocaine, Marijuana   Comment: daily use x 1 month    Social History   Socioeconomic History  . Marital status: Widowed    Spouse name: None  . Number of children: None  . Years of education: None  . Highest education level: None  Social Needs  . Financial resource strain: None  . Food insecurity - worry: None  . Food insecurity - inability: None  . Transportation needs - medical: None  . Transportation needs - non-medical: None  Occupational History  . None  Tobacco Use  . Smoking status: Former Games developer  . Smokeless tobacco: Never Used  Substance and Sexual Activity  . Alcohol use: Yes    Comment: occasional  . Drug use: Yes    Types: Cocaine, Marijuana    Comment: daily use x 1 month  . Sexual activity: Yes  Other Topics Concern  . None  Social History Narrative  . None    Hospital Course:    Mr. Logan Hudson is a 52 year old male with a history of schizoaffective disorder brought  to the hospital by the police when he tried to step in front of the traffic. Depression, auditory hallucinations and suicidal ideation have resolved by the end of the week and the patient was able to contract for safety. At the time of discharge on Monday, the patient reported auditory hallucinations and suicidal ideation again as he  discovered that he is not allowed to return with "friends" and will have to go to the homeless shelter. I do believe that this is a manipulation to extend hospitalization. Of note, last week the patient did not allow us to contact his "friends" so were unable to create an alternative plan.  #Mood, improved -continue Seroquel 200 mg at night, 50 mg TID -continue Buspar 15 mg BID -continue Trazodone 100 mg nightly  #Substance abuse -positive for cocaine -minimizes problems and declines treatment  #GERD -Protonix 40 mg daily  #Metabolic syndrome monitoring -Lipid panel TSH and HgbA1C are unremarkable -EKG, QTc 435  #Disposition -discharge to the homeless shelter -follow up with [facility]    Physical Findings: AIMS:  , ,  ,  ,    CIWA:    COWS:     Musculoskeletal: Strength & Muscle Tone: within normal limits Gait & Station: normal Patient leans: N/A  Psychiatric Specialty Exam: Physical Exam  Nursing note and vitals reviewed. Psychiatric: His speech is normal and behavior is normal. Judgment and thought content normal. His mood appears anxious. Cognition and memory are normal.    Review of Systems  Neurological: Negative.   Psychiatric/Behavioral: Positive for substance abuse. The patient is nervous/anxious.   All other systems reviewed and are negative.   Blood pressure 99/60, pulse 93, temperature 97.7 F (36.5 C), temperature source Oral, resp. rate 18, height 6' (1.829 m), weight 109.8 kg (242 lb), SpO2 98 %.Body mass index is 32.82 kg/m.  General Appearance: Casual  Eye Contact:  Good  Speech:  Clear and Coherent  Volume:  Normal  Mood:  Anxious  Affect:  Appropriate  Thought Process:  Goal Directed and Descriptions of Associations: Intact  Orientation:  Full (Time, Place, and Person)  Thought Content:  WDL  Suicidal Thoughts:  No  Homicidal Thoughts:  No  Memory:  Immediate;   Fair Recent;   Fair Remote;   Fair  Judgement:  Poor  Insight:  Shallow   Psychomotor Activity:  Normal  Concentration:  Concentration: Fair and Attention Span: Fair  Recall:  Fis[employer]Fair  Fund of Knowledge:  Fair  Language:  Fair  Akathisia:  No  Handed:  Right  AIMS (if indicated):     Assets:  Communication Skills Desire for Improvement Physical Health Resilience Social Support  ADL's:  Intact  Cognition:  WNL  Sleep:  Number of Hours: 7     Have you used any form of tobacco in the last 30 days? (Cigarettes, Smokeless Tobacco, Cigars, and/or Pipes): No  Has this patient used any form of tobacco in the last 30 days? (Cigarettes, Smokeless Tobacco, Cigars, and/or Pipes) Yes, No  Blood Alcohol level:  Lab Results  Component Value Date   [facility] 03/12/2016   ETH <11 05/22/2011    Metabolic Disorder Labs:  Lab Results  Component Value Date   HGBA1C 5.3 06/23/2017   MPG 105.41 06/23/2017   No results found for: PROLACTIN Lab Results  Component Value Date   CHOL 151 06/23/2017   TRIG 96 06/23/2017   HDL 61 06/23/2017   CHOLHDL 2.5 06/23/2017   VLDL 19 06/23/2017   LDLCALC 71  06/23/2017    See Psychiatric Specialty Exam and Suicide Risk Assessment completed by Attending Physician prior to discharge.  Discharge destination:  Home  Is patient on multiple antipsychotic therapies at discharge:  No   Has Patient had three or more failed trials of antipsychotic monotherapy by history:  No  Recommended Plan for Multiple Antipsychotic Therapies: NA  Discharge Instructions    Diet - low sodium heart healthy   Complete by:  As directed    Increase activity slowly   Complete by:  As directed      Allergies as of 06/28/2017      Reactions   Asa [aspirin]    Nicotine    Paxil [paroxetine Hcl]    Tylenol [acetaminophen]       Medication List    TAKE these medications     Indication  busPIRone 15 MG tablet Commonly known as:  BUSPAR Take 1 tablet (15 mg total) by mouth 2 (two) times daily.  Indication:  Symptoms of Feeling Anxious    hydrOXYzine 25 MG tablet Commonly known as:  ATARAX/VISTARIL Take 1 tablet (25 mg total) by mouth 3 (three) times daily as needed for anxiety.  Indication:  Feeling Anxious   QUEtiapine 200 MG tablet Commonly known as:  SEROQUEL Take 1 tablet (200 mg total) by mouth at bedtime. What changed:    medication strength  how much to take  Indication:  Depressive Phase of Manic-Depression   QUEtiapine 50 MG tablet Commonly known as:  SEROQUEL Take 1 tablet (50 mg total) by mouth 3 (three) times daily. What changed:  when to take this  Indication:  Depressive Phase of Manic-Depression   traZODone 100 MG tablet Commonly known as:  DESYREL Take 1 tablet (100 mg total) by mouth at bedtime. What changed:    medication strength  how much to take  when to take this  reasons to take this  Indication:  Trouble Sleeping      Follow-up Information    Services, [facility]. Go on 07/26/2017.   Why:  Your appointment is scheduled for 06/28/17.  You may walk-in for your appointment between the hours of 8:00A-2:30PM.  Please bring a copy of her driver's license and social security card if you have them available. Contact information: [address] [place] [address] [phone]           Follow-up recommendations:  Activity:  as tolerated Diet:  low sodium heart healthy Other:  keep follow up appointments  Comments:    Signed: Kristine Linea, MD 06/28/2017, 7:59 AM

## 2017-09-14 ENCOUNTER — Other Ambulatory Visit: Payer: Self-pay

## 2017-09-14 ENCOUNTER — Encounter: Payer: Self-pay | Admitting: Psychiatry

## 2017-09-14 ENCOUNTER — Inpatient Hospital Stay
Admission: AD | Admit: 2017-09-14 | Discharge: 2017-09-20 | DRG: 885 | Disposition: A | Discharge status: Rehabilitation Facility | Payer: No Typology Code available for payment source | Source: Intra-hospital | Attending: Psychiatry | Admitting: Psychiatry

## 2017-09-14 DIAGNOSIS — Z888 Allergy status to other drugs, medicaments and biological substances status: Secondary | ICD-10-CM

## 2017-09-14 DIAGNOSIS — T43596A Underdosing of other antipsychotics and neuroleptics, initial encounter: Secondary | ICD-10-CM | POA: Diagnosis present

## 2017-09-14 DIAGNOSIS — F250 Schizoaffective disorder, bipolar type: Secondary | ICD-10-CM | POA: Diagnosis present

## 2017-09-14 DIAGNOSIS — Z79899 Other long term (current) drug therapy: Secondary | ICD-10-CM | POA: Diagnosis not present

## 2017-09-14 DIAGNOSIS — F333 Major depressive disorder, recurrent, severe with psychotic symptoms: Secondary | ICD-10-CM | POA: Diagnosis present

## 2017-09-14 DIAGNOSIS — Z915 Personal history of self-harm: Secondary | ICD-10-CM

## 2017-09-14 DIAGNOSIS — Z8659 Personal history of other mental and behavioral disorders: Secondary | ICD-10-CM

## 2017-09-14 DIAGNOSIS — Z91120 Patient's intentional underdosing of medication regimen due to financial hardship: Secondary | ICD-10-CM | POA: Diagnosis not present

## 2017-09-14 DIAGNOSIS — Z811 Family history of alcohol abuse and dependence: Secondary | ICD-10-CM

## 2017-09-14 DIAGNOSIS — F1910 Other psychoactive substance abuse, uncomplicated: Secondary | ICD-10-CM | POA: Diagnosis present

## 2017-09-14 DIAGNOSIS — G4700 Insomnia, unspecified: Secondary | ICD-10-CM | POA: Diagnosis present

## 2017-09-14 DIAGNOSIS — F14159 Cocaine abuse with cocaine-induced psychotic disorder, unspecified: Secondary | ICD-10-CM | POA: Diagnosis present

## 2017-09-14 DIAGNOSIS — Z87891 Personal history of nicotine dependence: Secondary | ICD-10-CM

## 2017-09-14 DIAGNOSIS — R45851 Suicidal ideations: Secondary | ICD-10-CM | POA: Diagnosis present

## 2017-09-14 DIAGNOSIS — Z886 Allergy status to analgesic agent status: Secondary | ICD-10-CM | POA: Diagnosis not present

## 2017-09-14 DIAGNOSIS — Z630 Problems in relationship with spouse or partner: Secondary | ICD-10-CM

## 2017-09-14 DIAGNOSIS — I951 Orthostatic hypotension: Secondary | ICD-10-CM | POA: Diagnosis present

## 2017-09-14 DIAGNOSIS — Z590 Homelessness: Secondary | ICD-10-CM

## 2017-09-14 HISTORY — DX: Major depressive disorder, recurrent severe without psychotic features: F33.2

## 2017-09-14 HISTORY — DX: Schizoaffective disorder, bipolar type: F25.0

## 2017-09-14 MED ORDER — QUETIAPINE FUMARATE 25 MG PO TABS
50.0000 mg | ORAL_TABLET | Freq: Three times a day (TID) | ORAL | Status: DC
  Administered 2017-09-15: 50 mg via ORAL
  Filled 2017-09-14: qty 2

## 2017-09-14 MED ORDER — MAGNESIUM HYDROXIDE 400 MG/5ML PO SUSP: 30.0000 mL | Freq: Every day | ORAL | Status: DC | PRN

## 2017-09-14 MED ORDER — HYDROXYZINE HCL 25 MG PO TABS
25.0000 mg | ORAL_TABLET | Freq: Three times a day (TID) | ORAL | Status: DC | PRN
  Administered 2017-09-16: 25 mg via ORAL
  Filled 2017-09-14: qty 1

## 2017-09-14 MED ORDER — ACETAMINOPHEN 325 MG PO TABS: 650.0000 mg | ORAL_TABLET | Freq: Four times a day (QID) | ORAL | Status: DC | PRN

## 2017-09-14 MED ORDER — QUETIAPINE FUMARATE 200 MG PO TABS
200.0000 mg | ORAL_TABLET | Freq: Every day | ORAL | Status: DC
  Administered 2017-09-14: 200 mg via ORAL
  Filled 2017-09-14: qty 1

## 2017-09-14 MED ORDER — BUSPIRONE HCL 15 MG PO TABS
15.0000 mg | ORAL_TABLET | Freq: Two times a day (BID) | ORAL | Status: DC
  Administered 2017-09-14 – 2017-09-15 (×2): 15 mg via ORAL
  Filled 2017-09-14 (×4): qty 1

## 2017-09-14 MED ORDER — ALUM & MAG HYDROXIDE-SIMETH 200-200-20 MG/5ML PO SUSP
30.0000 mL | ORAL | Status: DC | PRN
  Administered 2017-09-17 – 2017-09-20 (×5): 30 mL via ORAL
  Filled 2017-09-14 (×5): qty 30

## 2017-09-14 MED ORDER — TRAZODONE HCL 100 MG PO TABS
100.0000 mg | ORAL_TABLET | Freq: Every day | ORAL | Status: DC
  Administered 2017-09-14: 100 mg via ORAL
  Filled 2017-09-14: qty 1

## 2017-09-15 ENCOUNTER — Encounter: Payer: Self-pay | Admitting: Psychiatry

## 2017-09-15 DIAGNOSIS — F333 Major depressive disorder, recurrent, severe with psychotic symptoms: Principal | ICD-10-CM

## 2017-09-15 LAB — COMPREHENSIVE METABOLIC PANEL
ALT: 39 U/L (ref 17–63)
ANION GAP: 6 (ref 5–15)
AST: 38 U/L (ref 15–41)
Albumin: 3.7 g/dL (ref 3.5–5.0)
Alkaline Phosphatase: 92 U/L (ref 38–126)
BUN: 15 mg/dL (ref 6–20)
CHLORIDE: 107 mmol/L (ref 101–111)
CO2: 26 mmol/L (ref 22–32)
Calcium: 8.4 mg/dL — ABNORMAL LOW (ref 8.9–10.3)
Creatinine, Ser: 0.96 mg/dL (ref 0.61–1.24)
Glucose, Bld: 94 mg/dL (ref 65–99)
POTASSIUM: 4 mmol/L (ref 3.5–5.1)
Sodium: 139 mmol/L (ref 135–145)
Total Bilirubin: 0.6 mg/dL (ref 0.3–1.2)
Total Protein: 6.7 g/dL (ref 6.5–8.1)

## 2017-09-15 LAB — CBC
HCT: 44.2 % (ref 40.0–52.0)
Hemoglobin: 14.8 g/dL (ref 13.0–18.0)
MCH: 28.3 pg (ref 26.0–34.0)
MCHC: 33.4 g/dL (ref 32.0–36.0)
MCV: 84.5 fL (ref 80.0–100.0)
PLATELETS: 233 10*3/uL (ref 150–440)
RBC: 5.23 MIL/uL (ref 4.40–5.90)
RDW: 13.3 % (ref 11.5–14.5)
WBC: 7.4 10*3/uL (ref 3.8–10.6)

## 2017-09-15 MED ORDER — TRAZODONE HCL 100 MG PO TABS
100.0000 mg | ORAL_TABLET | Freq: Every evening | ORAL | Status: DC | PRN
  Administered 2017-09-15: 100 mg via ORAL
  Filled 2017-09-15: qty 1

## 2017-09-15 MED ORDER — CITALOPRAM HYDROBROMIDE 20 MG PO TABS
20.0000 mg | ORAL_TABLET | Freq: Every day | ORAL | Status: DC
  Administered 2017-09-15 – 2017-09-20 (×6): 20 mg via ORAL
  Filled 2017-09-15 (×6): qty 1

## 2017-09-15 MED ORDER — ENSURE ENLIVE PO LIQD
237.0000 mL | Freq: Two times a day (BID) | ORAL | Status: DC
  Administered 2017-09-16 – 2017-09-19 (×7): 237 mL via ORAL

## 2017-09-15 MED ORDER — BUSPIRONE HCL 10 MG PO TABS
10.0000 mg | ORAL_TABLET | Freq: Two times a day (BID) | ORAL | Status: DC
  Administered 2017-09-15 – 2017-09-16 (×2): 10 mg via ORAL
  Filled 2017-09-15 (×2): qty 1

## 2017-09-15 MED ORDER — OLANZAPINE 10 MG PO TABS
10.0000 mg | ORAL_TABLET | Freq: Every day | ORAL | Status: DC
  Administered 2017-09-15: 10 mg via ORAL
  Filled 2017-09-15: qty 1

## 2017-09-15 NOTE — Progress Notes (Signed)
D- Patient alert and oriented. Patient presents in a pleasant mood on assessment stating to this writer that his depression level is a "7/10" because he is "just tired and I'm trying to get into rehab". Patient denies SI, HI, AVH, and pain at this time. Patient also denies any signs/symptoms of anxiety at this time. Patient has no stated goals at this time.  A- Scheduled medications administered to patient, per MD orders. Support and encouragement provided.  Routine safety checks conducted every 15 minutes.  Patient informed to notify staff with problems or concerns.  R- No adverse drug reactions noted. Patient contracts for safety at this time. Patient compliant with medications and treatment plan. Patient receptive, calm, and cooperative. Patient interacts well with others on the unit.  Patient remains safe at this time.

## 2017-09-15 NOTE — Progress Notes (Signed)
NUTRITION ASSESSMENT  Pt identified as at risk on the Malnutrition Screen Tool  INTERVENTION: Provide Ensure Enlive po BID, each supplement provides 350 kcal and 20 grams of protein.  Allow double protein portions at meals.  Encouraged ongoing adequate intake of calories and protein at meals.  Discussed food resources that are typically available in every county (patient reports he is not from [place]). Encouraged him to contact the county and ask about nutrition resources available in his county so he can have more regular intake of food.  NUTRITION DIAGNOSIS: Unintentional weight loss related to sub-optimal intake as evidenced by pt report.   Goal: Pt to meet >/= 90% of their estimated nutrition needs.  Monitor:  PO intake  Assessment:  52 y.o. male with PMHx of depression now admitted from [facility] after SI and attempted suicide.  Met with patient in his room. He reports he was not eating well PTA because he did not have access to food. He reports he was panhandling and was eating irregularly whenever he could have a meal. He was also recently in prison for 40 days. He is hungry now and reports he is eating all of his meals. He is concerned that the meals are not filling him up. Patient reports he is hopeful he can go to a rehab center after discharge.  He reports his UBW was 250 lbs and he has lost down to 216 lbs.  Meal Completion: 100%  Medications and labs reviewed. None are nutritionally pertinent at this time.  Height: Ht Readings from Last 1 Encounters:  09/14/17 6' 2"  (1.88 m)    Weight: Wt Readings from Last 1 Encounters:  09/14/17 216 lb (98 kg)    Weight Hx: Wt Readings from Last 10 Encounters:  09/14/17 216 lb (98 kg)  06/22/17 242 lb (109.8 kg)  03/12/16 250 lb (113.4 kg)  03/12/16 280 lb (127 kg)    BMI:  Body mass index is 27.73 kg/m. Pt meets criteria for overweight based on current BMI.  Estimated Nutritional Needs: Kcal:  25-30 kcal/kg Protein: > 1 gram protein/kg Fluid: 1 ml/kcal  Diet Order: Diet regular Room service appropriate? Yes; Fluid consistency: Thin Pt is also offered choice of unit snacks mid-morning and mid-afternoon.  Pt is eating as desired.   Willey Blade, MS, [place], LDN Office: [phone] Pager: [phone] After Hours/Weekend Pager: [phone]

## 2017-09-15 NOTE — Progress Notes (Signed)
Patient ID: Logan CliffJames Oliver Pupo Jr., male   DOB: Feb 08, 1966, 52 y.o.   MRN: [id number] [facility] and [facility] "I walked into the traffic, the Vans[place]iff stopped me and brought me to the hospital.... I was in prison for 40 days for Panhandling." Homeless, helpless, hopeless, substance use (crack-cocaine). Pleasant, logical, polite; disheveled. Skin assessment and Contraband search completed with Trinna PostAlex, RN. Gross skin intact except for the Cross Crucfix tattoo on the right deltoid process; no contraband found on patient and in his belongings. Unit and room orientation completed.

## 2017-09-15 NOTE — Progress Notes (Signed)
Recreation Therapy Notes   Date: 09/15/2017  Time: 9:30 am   Location: Craft Room   Behavioral response: N/A   Intervention Topic: Relaxation  Discussion/Intervention: Patient did not attend group.   Clinical Observations/Feedback:  Patient did not attend group.   Samar Venneman LRT/CTRS        Nas Wafer 09/15/2017 11:29 AM
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
## 2017-09-15 NOTE — Plan of Care (Signed)
Patient slept for Estimated Hours of 7.30; Precautionary checks every 15 minutes for safety maintained, room free of safety hazards, patient sustains no injury or falls during this shift.  Problem: Spiritual Needs Goal: Ability to function at adequate level Outcome: Progressing   Problem: Education: Goal: Knowledge of [place] [facility] Education information/materials will improve Outcome: Progressing Goal: Emotional status will improve Outcome: Progressing Goal: Mental status will improve Outcome: Progressing Goal: Verbalization of understanding the information provided will improve Outcome: Progressing   Problem: Activity: Goal: Interest or engagement in activities will improve Outcome: Progressing Goal: Sleeping patterns will improve Outcome: Progressing   Problem: Coping: Goal: Ability to verbalize frustrations and anger appropriately will improve Outcome: Progressing Goal: Ability to demonstrate self-control will improve Outcome: Progressing   Problem: Health Behavior/Discharge Planning: Goal: Identification of resources available to assist in meeting health care needs will improve Outcome: Progressing Goal: Compliance with treatment plan for underlying cause of condition will improve Outcome: Progressing   Problem: Physical Regulation: Goal: Ability to maintain clinical measurements within normal limits will improve Outcome: Progressing   Problem: Safety: Goal: Periods of time without injury will increase Outcome: Progressing   Problem: Education: Goal: Utilization of techniques to improve thought processes will improve Outcome: Progressing Goal: Knowledge of the prescribed therapeutic regimen will improve Outcome: Progressing   Problem: Activity: Goal: Interest or engagement in leisure activities will improve Outcome: Progressing Goal: Imbalance in normal sleep/wake cycle will improve Outcome: Progressing   Problem: Coping: Goal: Coping ability will  improve Outcome: Progressing Goal: Will verbalize feelings Outcome: Progressing   Problem: Health Behavior/Discharge Planning: Goal: Ability to make decisions will improve Outcome: Progressing Goal: Compliance with therapeutic regimen will improve Outcome: Progressing   Problem: Role Relationship: Goal: Will demonstrate positive changes in social behaviors and relationships Outcome: Progressing   Problem: Safety: Goal: Ability to disclose and discuss suicidal ideas will improve Outcome: Progressing Goal: Ability to identify and utilize support systems that promote safety will improve Outcome: Progressing   Problem: Self-Concept: Goal: Will verbalize positive feelings about self Outcome: Progressing Goal: Level of anxiety will decrease Outcome: Progressing   Problem: Activity: Goal: Will identify at least one activity in which they can participate Outcome: Progressing   Problem: Coping: Goal: Ability to identify and develop effective coping behavior will improve Outcome: Progressing Goal: Ability to interact with others will improve Outcome: Progressing Goal: Demonstration of participation in decision-making regarding own care will improve Outcome: Progressing Goal: Ability to use eye contact when communicating with others will improve Outcome: Progressing   Problem: Health Behavior/Discharge Planning: Goal: Identification of resources available to assist in meeting health care needs will improve Outcome: Progressing   Problem: Self-Concept: Goal: Will verbalize positive feelings about self Outcome: Progressing   Problem: Education: Goal: Ability to make informed decisions regarding treatment will improve Outcome: Progressing   Problem: Coping: Goal: Coping ability will improve Outcome: Progressing   Problem: Health Behavior/Discharge Planning: Goal: Identification of resources available to assist in meeting health care needs will improve Outcome:  Progressing   Problem: Medication: Goal: Compliance with prescribed medication regimen will improve Outcome: Progressing   Problem: Self-Concept: Goal: Ability to disclose and discuss suicidal ideas will improve Outcome: Progressing Goal: Will verbalize positive feelings about self Outcome: Progressing

## 2017-09-15 NOTE — Tx Team (Addendum)
Interdisciplinary Treatment and Diagnostic Plan Update  09/15/2017 Time of Session: 11:20am Logan [name] MRN: [id number]  Principal Diagnosis: Schizoaffective disorder, bipolar type (HCC)  Secondary Diagnoses: Principal Problem:   Schizoaffective disorder, bipolar type (HCC) Active Problems:   Suicidal ideation   Current Medications:  Current Facility-Administered Medications  Medication Dose Route Frequency Provider Last Rate Last Dose  . alum & mag hydroxide-simeth (MAALOX/MYLANTA) 200-200-20 MG/5ML suspension 30 mL  30 mL Oral Q4H PRN Pucilowska, Jolanta B, MD      . busPIRone (BUSPAR) tablet 15 mg  15 mg Oral BID Pucilowska, Jolanta B, MD   15 mg at 09/15/17 0839  . citalopram (CELEXA) tablet 20 mg  20 mg Oral Daily McNew, Ileene Hutchinson, MD   20 mg at 09/15/17 1129  . hydrOXYzine (ATARAX/VISTARIL) tablet 25 mg  25 mg Oral TID PRN Pucilowska, Jolanta B, MD      . magnesium hydroxide (MILK OF MAGNESIA) suspension 30 mL  30 mL Oral Daily PRN Pucilowska, Jolanta B, MD      . OLANZapine (ZYPREXA) tablet 10 mg  10 mg Oral QHS McNew, Holly R, MD      . traZODone (DESYREL) tablet 100 mg  100 mg Oral QHS PRN McNew, Ileene Hutchinson, MD       PTA Medications: Medications Prior to Admission  Medication Sig Dispense Refill Last Dose  . busPIRone (BUSPAR) 15 MG tablet Take 1 tablet (15 mg total) by mouth 2 (two) times daily. 60 tablet 1   . hydrOXYzine (ATARAX/VISTARIL) 25 MG tablet Take 1 tablet (25 mg total) by mouth 3 (three) times daily as needed for anxiety. 30 tablet 0   . QUEtiapine (SEROQUEL) 200 MG tablet Take 1 tablet (200 mg total) by mouth at bedtime. 30 tablet 1   . QUEtiapine (SEROQUEL) 50 MG tablet Take 1 tablet (50 mg total) by mouth 3 (three) times daily. 90 tablet 1   . traZODone (DESYREL) 100 MG tablet Take 1 tablet (100 mg total) by mouth at bedtime. 30 tablet 1     Patient Stressors: Financial difficulties Medication change or noncompliance Substance abuse  Patient  Strengths: Ability for insight Active sense of humor Average or above average intelligence Capable of independent living  Treatment Modalities: Medication Management, Group therapy, Case management,  1 to 1 session with clinician, Psychoeducation, Recreational therapy.   Physician Treatment Plan for Primary Diagnosis: Schizoaffective disorder, bipolar type (HCC) Long Term Goal(s):     Short Term Goals:    Medication Management: Evaluate patient's response, side effects, and tolerance of medication regimen.  Therapeutic Interventions: 1 to 1 sessions, Unit Group sessions and Medication administration.  Evaluation of Outcomes: Progressing  Physician Treatment Plan for Secondary Diagnosis: Principal Problem:   Schizoaffective disorder, bipolar type (HCC) Active Problems:   Suicidal ideation  Long Term Goal(s):     Short Term Goals:       Medication Management: Evaluate patient's response, side effects, and tolerance of medication regimen.  Therapeutic Interventions: 1 to 1 sessions, Unit Group sessions and Medication administration.  Evaluation of Outcomes: Progressing   RN Treatment Plan for Primary Diagnosis: Schizoaffective disorder, bipolar type (HCC) Long Term Goal(s): Knowledge of disease and therapeutic regimen to maintain health will improve  Short Term Goals: Ability to verbalize feelings will improve, Ability to identify and develop effective coping behaviors will improve and Compliance with prescribed medications will improve  Medication Management: RN will administer medications as ordered by provider, will assess and evaluate patient's response and provide  education to patient for prescribed medication. RN will report any adverse and/or side effects to prescribing provider.  Therapeutic Interventions: 1 on 1 counseling sessions, Psychoeducation, Medication administration, Evaluate responses to treatment, Monitor vital signs and CBGs as ordered, Perform/monitor  CIWA, COWS, AIMS and Fall Risk screenings as ordered, Perform wound care treatments as ordered.  Evaluation of Outcomes: Progressing   LCSW Treatment Plan for Primary Diagnosis: Schizoaffective disorder, bipolar type (HCC) Long Term Goal(s): Safe transition to appropriate next level of care at discharge, Engage patient in therapeutic group addressing interpersonal concerns.  Short Term Goals: Engage patient in aftercare planning with referrals and resources, Increase social support, Facilitate patient progression through stages of change regarding substance use diagnoses and concerns, Identify triggers associated with mental health/substance abuse issues and Increase skills for wellness and recovery  Therapeutic Interventions: Assess for all discharge needs, 1 to 1 time with Social worker, Explore available resources and support systems, Assess for adequacy in community support network, Educate family and significant other(s) on suicide prevention, Complete Psychosocial Assessment, Interpersonal group therapy.  Evaluation of Outcomes: Progressing   Progress in Treatment: Attending groups: No. Participating in groups: No. Taking medication as prescribed: Yes. Toleration medication: Yes. Family/Significant other contact made: No, will contact:  Patient refused Patient understands diagnosis: Yes. Discussing patient identified problems/goals with staff: Yes. Medical problems stabilized or resolved: Yes. Denies suicidal/homicidal ideation: Yes. Issues/concerns per patient self-inventory: No. Other:  New problem(s) identified: No, Describe:  None  New Short Term/Long Term Goal(s):To get back on my medications, stay clean and go to a rehab program."  Discharge Plan or Barriers: To enter into a rehab program.  Reason for Continuation of Hospitalization: Depression Medication stabilization  Estimated Length of Stay: 3-5 days  Recreational Therapy: Patient  Stressors: Depression  Patient Goal: Patient will identify benefit of making healthy decisions post d/c within 5 recreation therapy group sessions  Attendees: Patient: Logan Hudson 09/15/2017 11:34 AM  Physician: Corinna GabHolly McNew, MD 09/15/2017 11:34 AM  Nursing: Hulan AmatoGwen Farrish, RN 09/15/2017 11:34 AM  RN Care Manager: 09/15/2017 11:34 AM  Social Worker: Johny Shearsassandra Jarrett, LCSWA 09/15/2017 11:34 AM  Recreational Therapist: Danella DeisShay. Dreama SaaOutlaw CTRS, LRT 09/15/2017 11:34 AM  Other: Jake SharkSara Laws, LCSW 09/15/2017 11:34 AM  Other: Heidi DachKelsey Craig, LCSW 09/15/2017 11:34 AM  Other: 09/15/2017 11:34 AM    Scribe for Treatment Team: Johny Shearsassandra  Jarrett, LCSW 09/15/2017 11:34 AM

## 2017-09-15 NOTE — Plan of Care (Addendum)
Patient found sleeping in bed upon my arrival. Patient is neither vsible nor social this evening. Remains isolative to room throughout the evening. Denies SI/HI/AVH. Continues to report depression but states, "I'm alright." Denies pain. Reports eating and voiding adequately. Hygiene is poor. Compliant with HS medications and staff direction. Q 15 minute checks maintained. Will continue to monitor throughout the shift. Patient slept 8.25 hours. No apparent distress. Patient has significant BP change upon standing. Educated to rise slowly and encouraged fluids. Will endorse care to oncoming shift.   Problem: Education: Goal: Emotional status will improve Outcome: Progressing Goal: Mental status will improve Outcome: Progressing   Problem: Coping: Goal: Ability to demonstrate self-control will improve Outcome: Progressing   Problem: Health Behavior/Discharge Planning: Goal: Compliance with therapeutic regimen will improve Outcome: Progressing   Problem: Activity: Goal: Interest or engagement in activities will improve Outcome: Not Progressing Goal: Sleeping patterns will improve Outcome: Not Progressing   Problem: Activity: Goal: Interest or engagement in leisure activities will improve Outcome: Not Progressing

## 2017-09-15 NOTE — Progress Notes (Signed)
Recreation Therapy Notes  INPATIENT RECREATION THERAPY ASSESSMENT  Patient Details Name: Logan CliffJames Oliver Klima Jr. MRN: [id number] DOB: 20-Jun-1965 Today's Date: 09/15/2017       Information Obtained From: Patient  Able to Participate in Assessment/Interview: Yes  Patient Presentation: Responsive  Reason for Admission (Per Patient): Active Symptoms, Suicidal Ideation, Med Non-Compliance  Patient Stressors: Other (Comment)(Depression)  Coping Skills:   TV, Exercise, Other (Comment)(Medication)  Leisure Interests (2+):  Exercise - Walking, Games - Video games, Individual - TV  Frequency of Recreation/Participation: Weekly  Awareness of Community Resources:  Yes  Community Resources:  Library, [employer]  Current Use: Yes  If no, Barriers?:    Expressed Interest in [employer] Resource Information:    [place]  [place]  Patient Main Form of Transportation: Walk  Patient Strengths:  Good personality, Sense of Humor  Patient Identified Areas of Improvement:  Work on getting in Rehab  Patient Goal for Hospitalization:  To get back on medication and stay clean and get into a program  Current SI (including self-harm):  No  Current HI:  No  Current AVH: No  Staff Intervention Plan: Group Attendance, Collaborate with Interdisciplinary Treatment Team  Consent to Intern Participation: N/A  Romeo Zielinski 09/15/2017, 2:47 PM

## 2017-09-15 NOTE — [facility] Suicide Risk Assessment (Signed)
[facility]   Nursing information obtained from:  Patient, Review of record Demographic factors:  Male, Caucasian, Low socioeconomic status, Unemployed Current Mental Status:  Suicidal ideation indicated by patient, Suicide plan, Self-harm thoughts, Intention to act on suicide plan Loss Factors:  Financial problems / change in socioeconomic status Historical Factors:  Prior suicide attempts Risk Reduction Factors:  NA  Total Time spent with patient: 1 hour Principal Problem: Severe recurrent major depressive disorder with psychotic features (HCC) Diagnosis:   Patient Active Problem List   Diagnosis Date Noted  . Severe recurrent major depressive disorder with psychotic features (HCC) [F33.3] 09/15/2017    Priority: High  . Suicidal ideation [R45.851] 03/13/2016  . Cocaine abuse without complication (HCC) [F14.10] 05/23/2011   Subjective Data: See H&P  Continued Clinical Symptoms:  Alcohol Use Disorder Identification Test Final Score (AUDIT): 5 The "Alcohol Use Disorders Identification Test", Guidelines for Use in Primary Care, Second Edition.  World Science writerHealth Organization [facility](WHO). Score between 0-7:  no or low risk or alcohol related problems. Score between 8-15:  moderate risk of alcohol related problems. Score between 16-19:  high risk of alcohol related problems. Score 20 or above:  warrants further diagnostic evaluation for alcohol dependence and treatment.   CLINICAL FACTORS:   Depression:   Impulsivity    COGNITIVE FEATURES THAT CONTRIBUTE TO RISK:  None    SUICIDE RISK:   Moderate:  Frequent suicidal ideation with limited intensity, and duration, some specificity in terms of plans, no associated intent, good self-control, limited dysphoria/symptomatology, some risk factors present, and identifiable protective factors, including available and accessible social support.  PLAN OF CARE: See H&P  I certify that inpatient services furnished can  reasonably be expected to improve the patient's condition.   Haskell RilingHolly R Julaine Zimny, MD 09/15/2017, 3:45 PM

## 2017-09-15 NOTE — Plan of Care (Signed)
Patient is alert and oriented. Patient expresses hopelessness, and depression. Patient stated this morning he is not feeling Suicidal at the time of assessment, as well as AVH and HI. Patient does contract for safety while on the unit. Patient affect is pleasant and compliant. Patient is going to work on attending groups. Nurse will continue to monitor. Safety checks will continue Q 15 minutes.

## 2017-09-15 NOTE — Tx Team (Signed)
Initial Treatment Plan 09/15/2017 12:21 AM [name] BJY:[id number]    PATIENT STRESSORS: Financial difficulties Medication change or noncompliance Substance abuse   PATIENT STRENGTHS: Ability for insight Active sense of humor Average or above average intelligence Capable of independent living   PATIENT IDENTIFIED PROBLEMS: Mood Instability  Suicidal Attempt and Suicidal Ideation "I walked into traffic, Sheriff was there enough to stop me and brought me here"  Ineffective Coping Skills  Substance abuse (Crack-Cocaine)               DISCHARGE CRITERIA:  Adequate post-discharge living arrangements Improved stabilization in mood, thinking, and/or behavior Motivation to continue treatment in a less acute level of care Need for constant or close observation no longer present Verbal commitment to aftercare and medication compliance  PRELIMINARY DISCHARGE PLAN: Outpatient therapy Placement in alternative living arrangements  PATIENT/FAMILY INVOLVEMENT: This treatment plan has been presented to and reviewed with the patient, Logan CliffJames Oliver Clavin Jr. The patient and family have been given the opportunity to ask questions and make suggestions.  Logan NipperAbiodun T Leandra Vanderweele, RN 09/15/2017, 12:21 AM

## 2017-09-15 NOTE — BHH Group Notes (Signed)
  LCSW Group Therapy Note  09/15/2017 1:00pm  Type of Therapy/Topic:  Group Therapy:  Emotion Regulation  Participation Level:  Did Not Attend   Description of Group:   The purpose of this group is to assist patients in learning to regulate negative emotions and experience positive emotions. Patients will be guided to discuss ways in which they have been vulnerable to their negative emotions. These vulnerabilities will be juxtaposed with experiences of positive emotions or situations, and patients will be challenged to use positive emotions to combat negative ones. Special emphasis will be placed on coping with negative emotions in conflict situations, and patients will process healthy conflict resolution skills.  Therapeutic Goals: 1. Patient will identify two positive emotions or experiences to reflect on in order to balance out negative emotions 2. Patient will label two or more emotions that they find the most difficult to experience 3. Patient will demonstrate positive conflict resolution skills through discussion and/or role plays  Summary of Patient Progress:       Therapeutic Modalities:   Cognitive Behavioral Therapy Feelings Identification Dialectical Behavioral Therapy   Logan Hudson P Logan Busker, LCSW 09/15/2017 5:21 PM   

## 2017-09-15 NOTE — B[facility] Suicide Risk Assessment (Signed)
[facility] INPATIENT:  Family/Significant Other Suicide Prevention Education  Suicide Prevention Education:  Patient Refusal for Family/Significant Other Suicide Prevention Education: The patient Logan CliffJames Oliver Rockhold Jr. has refused to provide written consent for family/significant other to be provided Family/Significant Other Suicide Prevention Education during admission and/or prior to discharge.  Physician notified.  Johny ShearsCassandra  Chasten Blaze 09/15/2017, 10:21 AM

## 2017-09-15 NOTE — B[facility] Counselor (Signed)
[facility] Patient was scheduled for a second interview tomorrow 09/16/2017 at 10am.    Johny Shearsassandra Wendi Lastra, MSW, [place]CSWA, LCASA 09/15/2017 2:17 PM

## 2017-09-15 NOTE — Care Management (Signed)
RNCM consult received for Medication assistance (CSW should be able to assess for long term placement needs along with [employer]).  Patient can be provided referral to [facility] and Medication Management at discharge.  Please call Rush Landmarkngela RNCM [phone].  Email with application to [facility] sent to Dr. Jennet MaduroPucilowska.

## 2017-09-16 MED ORDER — BUSPIRONE HCL 15 MG PO TABS
7.5000 mg | ORAL_TABLET | Freq: Two times a day (BID) | ORAL | Status: DC
  Administered 2017-09-16 – 2017-09-20 (×8): 7.5 mg via ORAL
  Filled 2017-09-16 (×9): qty 1

## 2017-09-16 MED ORDER — BUSPIRONE HCL 7.5 MG PO TABS: 7.5000 mg | ORAL_TABLET | Freq: Two times a day (BID) | ORAL | 0 refills | Status: DC

## 2017-09-16 MED ORDER — HYDROXYZINE HCL 25 MG PO TABS: 25.0000 mg | ORAL_TABLET | Freq: Three times a day (TID) | ORAL | 0 refills | Status: DC | PRN

## 2017-09-16 MED ORDER — OLANZAPINE 10 MG PO TABS: 10.0000 mg | ORAL_TABLET | Freq: Every day | ORAL | 0 refills | Status: DC

## 2017-09-16 MED ORDER — CITALOPRAM HYDROBROMIDE 20 MG PO TABS: 20.0000 mg | ORAL_TABLET | Freq: Every day | ORAL | 0 refills | Status: DC

## 2017-09-16 MED ORDER — OLANZAPINE 5 MG PO TABS: 5.0000 mg | ORAL_TABLET | Freq: Two times a day (BID) | ORAL | 0 refills | Status: DC

## 2017-09-16 MED ORDER — TRAZODONE HCL 50 MG PO TABS
50.0000 mg | ORAL_TABLET | Freq: Every evening | ORAL | Status: DC | PRN
  Administered 2017-09-16 – 2017-09-19 (×2): 50 mg via ORAL
  Filled 2017-09-16 (×2): qty 1

## 2017-09-16 MED ORDER — TRAZODONE HCL 50 MG PO TABS: 50.0000 mg | ORAL_TABLET | Freq: Every day | ORAL | 0 refills | Status: DC

## 2017-09-16 MED ORDER — OLANZAPINE 5 MG PO TABS
5.0000 mg | ORAL_TABLET | Freq: Every day | ORAL | Status: DC
  Administered 2017-09-16 – 2017-09-19 (×4): 5 mg via ORAL
  Filled 2017-09-16 (×4): qty 1

## 2017-09-16 MED ORDER — OLANZAPINE 5 MG PO TABS
5.0000 mg | ORAL_TABLET | ORAL | Status: DC
  Administered 2017-09-16 – 2017-09-20 (×5): 5 mg via ORAL
  Filled 2017-09-16 (×5): qty 1

## 2017-09-16 NOTE — Progress Notes (Signed)
Recreation Therapy Notes  Date: 09/16/2017  Time: 9:30 am   Location: Craft Room   Behavioral response: N/A   Intervention Topic: Goals  Discussion/Intervention: Patient did not attend group.   Clinical Observations/Feedback:  Patient did not attend group.   Jashawn Floyd LRT/CTRS        Logan Hudson 09/16/2017 10:52 AM 

## 2017-09-16 NOTE — B[facility] Group Notes (Signed)
[facility] Group Notes:  (Nursing/MHT/Case Management/Adjunct)  Date:  09/16/2017  Time:  9:40 PM  Type of Therapy:  Group Therapy  Participation Level:  Did Not Attend   Mayra NeerJackie L Sadiyah Kangas 09/16/2017, 9:40 PM

## 2017-09-16 NOTE — B[facility] Group Notes (Signed)
  09/16/2017  Time: 1PM  Type of Therapy/Topic:  Group Therapy:  Balance in Life  Participation Level:  Did Not Attend  Description of Group:   This group will address the concept of balance and how it feels and looks when one is unbalanced. Patients will be encouraged to process areas in their lives that are out of balance and identify reasons for remaining unbalanced. Facilitators will guide patients in utilizing problem-solving interventions to address and correct the stressor making their life unbalanced. Understanding and applying boundaries will be explored and addressed for obtaining and maintaining a balanced life. Patients will be encouraged to explore ways to assertively make their unbalanced needs known to significant others in their lives, using other group members and facilitator for support and feedback.  Therapeutic Goals: 1. Patient will identify two or more emotions or situations they have that consume much of in their lives. 2. Patient will identify signs/triggers that life has become out of balance:  3. Patient will identify two ways to set boundaries in order to achieve balance in their lives:  4. Patient will demonstrate ability to communicate their needs through discussion and/or role plays  Summary of Patient Progress: Pt was invited to attend group but chose not to attend. CSW will continue to encourage pt to attend group throughout their admission.     Therapeutic Modalities:   Cognitive Behavioral Therapy Solution-Focused Therapy Assertiveness Training  Daly Whipkey, MSW, LCSW Clinical Social Worker 09/16/2017 2:13 PM   

## 2017-09-16 NOTE — Plan of Care (Signed)
Patient is coping well in the unit and employing positive attribute to self improving manners, actively participating in scheduled activities and compliant with his medicines no further complain voiced, denies any SI/HI and no signs of AVH, 15 minute rounding is maintained  Problem: Education: Goal: Knowledge of [place] [facility] Education information/materials will improve Outcome: Progressing Goal: Emotional status will improve Outcome: Progressing Goal: Mental status will improve Outcome: Progressing Goal: Verbalization of understanding the information provided will improve Outcome: Progressing   Problem: Activity: Goal: Interest or engagement in activities will improve Outcome: Progressing Goal: Sleeping patterns will improve Outcome: Progressing   Problem: Coping: Goal: Ability to verbalize frustrations and anger appropriately will improve Outcome: Progressing Goal: Ability to demonstrate self-control will improve Outcome: Progressing   Problem: Health Behavior/Discharge Planning: Goal: Identification of resources available to assist in meeting health care needs will improve Outcome: Progressing Goal: Compliance with treatment plan for underlying cause of condition will improve Outcome: Progressing   Problem: Physical Regulation: Goal: Ability to maintain clinical measurements within normal limits will improve Outcome: Progressing   Problem: Safety: Goal: Periods of time without injury will increase Outcome: Progressing   Problem: Education: Goal: Utilization of techniques to improve thought processes will improve Outcome: Progressing Goal: Knowledge of the prescribed therapeutic regimen will improve Outcome: Progressing   Problem: Safety: Goal: Ability to disclose and discuss suicidal ideas will improve Outcome: Progressing   Problem: Self-Concept: Goal: Will verbalize positive feelings about self Outcome: Progressing   Problem: Activity: Goal: Will  identify at least one activity in which they can participate Outcome: Progressing   Problem: Self-Concept: Goal: Will verbalize positive feelings about self Outcome: Progressing   Problem: Education: Goal: Ability to make informed decisions regarding treatment will improve Outcome: Progressing

## 2017-09-16 NOTE — B[facility] Group Notes (Signed)
[facility] Group Notes:  (Nursing/MHT/Case Management/Adjunct)  Date:  09/16/2017  Time:  3:17 PM  Type of Therapy:  Psychoeducational Skills  Participation Level:  Did Not Attend    Logan Hudson 09/16/2017, 3:17 PM

## 2017-09-16 NOTE — B[facility] Group Notes (Signed)
LCSW Group Therapy Note 09/16/2017 9:00 AM  Type of Therapy and Topic:  Group Therapy:  Setting Goals  Participation Level:  Did Not Attend  Description of Group: In this process group, patients discussed using strengths to work toward goals and address challenges.  Patients identified two positive things about themselves and one goal they were working on.  Patients were given the opportunity to share openly and support each other's plan for self-empowerment.  The group discussed the value of gratitude and were encouraged to have a daily reflection of positive characteristics or circumstances.  Patients were encouraged to identify a plan to utilize their strengths to work on current challenges and goals.  Therapeutic Goals 1. Patient will verbalize personal strengths/positive qualities and relate how these can assist with achieving desired personal goals 2. Patients will verbalize affirmation of peers plans for personal change and goal setting 3. Patients will explore the value of gratitude and positive focus as related to successful achievement of goals 4. Patients will verbalize a plan for regular reinforcement of personal positive qualities and circumstances.  Summary of Patient Progress:  Logan Hudson was invited to today's group, but chose not to attend.     Therapeutic Modalities Cognitive Behavioral Therapy Motivational Interviewing    Logan Hudson, [place]CS[place] 09/16/2017 3:23 PM

## 2017-09-16 NOTE — Plan of Care (Signed)
Patient has been functioning at an adequate level on the unit and verbalizes understanding of the information that's been provided to him and all questions or concerns have been addressed and answered at this time. Patient denies SI/HI/AVH as well as any signs/symptoms of anxiety at this time. Patient rates his depression level a "3/10" stating to this writer "it's going away, now that I have my medications in me". Patient has been isolated to his room throughout the day, but stated to this writer on assessment that he slept "on and off" last night. Patient has the ability to verbalize his anger/frustrations appropriately. Patient has been working towards maintaining his clinical measurements within normal limits and he has been free from injury thus far on the unit. Patient has been in compliance with and verbalizes understanding of his prescribed therapeutic/medication regimen and has not voiced any further questions or concerns at this time.  Patient has the ability to cope and states to this writer that his goal for today is to "stay positive and I have my review at 10 am, so hopefully it goes well". Patient has the ability to make decisions regarding his care as well as the ability to utilize his support systems that promote safety. Patient has interacted well with staff and other members on the unit without any issues thus far. Patient has used fair eye contact when communicating with this Clinical research associatewriter.  Patient has been free from any health-related complications and remains safe on the unit at this time.  Problem: Spiritual Needs Goal: Ability to function at adequate level Outcome: Progressing   Problem: Education: Goal: Knowledge of [place] [facility] Education information/materials will improve Outcome: Progressing Goal: Emotional status will improve Outcome: Progressing Goal: Mental status will improve Outcome: Progressing Goal: Verbalization of understanding the information provided will  improve Outcome: Progressing   Problem: Activity: Goal: Interest or engagement in activities will improve Outcome: Progressing Goal: Sleeping patterns will improve Outcome: Progressing   Problem: Coping: Goal: Ability to verbalize frustrations and anger appropriately will improve Outcome: Progressing Goal: Ability to demonstrate self-control will improve Outcome: Progressing   Problem: Health Behavior/Discharge Planning: Goal: Identification of resources available to assist in meeting health care needs will improve Outcome: Progressing Goal: Compliance with treatment plan for underlying cause of condition will improve Outcome: Progressing   Problem: Physical Regulation: Goal: Ability to maintain clinical measurements within normal limits will improve Outcome: Progressing   Problem: Safety: Goal: Periods of time without injury will increase Outcome: Progressing   Problem: Education: Goal: Utilization of techniques to improve thought processes will improve Outcome: Progressing Goal: Knowledge of the prescribed therapeutic regimen will improve Outcome: Progressing   Problem: Activity: Goal: Interest or engagement in leisure activities will improve Outcome: Progressing Goal: Imbalance in normal sleep/wake cycle will improve Outcome: Progressing   Problem: Coping: Goal: Coping ability will improve Outcome: Progressing Goal: Will verbalize feelings Outcome: Progressing   Problem: Health Behavior/Discharge Planning: Goal: Ability to make decisions will improve Outcome: Progressing Goal: Compliance with therapeutic regimen will improve Outcome: Progressing   Problem: Role Relationship: Goal: Will demonstrate positive changes in social behaviors and relationships Outcome: Progressing   Problem: Safety: Goal: Ability to disclose and discuss suicidal ideas will improve Outcome: Progressing Goal: Ability to identify and utilize support systems that promote safety will  improve Outcome: Progressing   Problem: Self-Concept: Goal: Will verbalize positive feelings about self Outcome: Progressing Goal: Level of anxiety will decrease Outcome: Progressing   Problem: Activity: Goal: Will identify at least one activity  in which they can participate Outcome: Progressing   Problem: Coping: Goal: Ability to identify and develop effective coping behavior will improve Outcome: Progressing Goal: Ability to interact with others will improve Outcome: Progressing Goal: Demonstration of participation in decision-making regarding own care will improve Outcome: Progressing Goal: Ability to use eye contact when communicating with others will improve Outcome: Progressing   Problem: Health Behavior/Discharge Planning: Goal: Identification of resources available to assist in meeting health care needs will improve Outcome: Progressing   Problem: Self-Concept: Goal: Will verbalize positive feelings about self Outcome: Progressing   Problem: Education: Goal: Ability to make informed decisions regarding treatment will improve Outcome: Progressing   Problem: Coping: Goal: Coping ability will improve Outcome: Progressing   Problem: Health Behavior/Discharge Planning: Goal: Identification of resources available to assist in meeting health care needs will improve Outcome: Progressing   Problem: Medication: Goal: Compliance with prescribed medication regimen will improve Outcome: Progressing   Problem: Self-Concept: Goal: Ability to disclose and discuss suicidal ideas will improve Outcome: Progressing Goal: Will verbalize positive feelings about self Outcome: Progressing   Problem: Elimination: Goal: Will not experience complications related to bowel motility Outcome: Progressing   Problem: Skin Integrity: Goal: Risk for impaired skin integrity will decrease Outcome: Progressing

## 2017-09-16 NOTE — Progress Notes (Signed)
D- Patient alert and oriented. Patient presents in a pleasant mood on assessment stating that he slept "on and off" last night. Patient denies SI, HI, AVH, and pain at this time. Patient also denies any signs/symptoms of depression/anxiety. Patient's goals for today is to "stay positive, I have a 10 am review, so I hope it goes well".  A- Scheduled medications administered to patient, per MD orders. Support and encouragement provided.  Routine safety checks conducted every 15 minutes.  Patient informed to notify staff with problems or concerns.  R- No adverse drug reactions noted. Patient contracts for safety at this time. Patient compliant with medications and treatment plan. Patient receptive, calm, and cooperative. Patient interacts well with others on the unit.  Patient remains safe at this time.

## 2017-09-16 NOTE — Progress Notes (Signed)
[facility] MD Progress Note  09/16/2017 12:53 PM Logan [name]  MRN:  [id number]   Subjective:  Pt states that he is starting to feel better. He is excited taht he got accepted into [employer]. He is much more hopeful. He states that SI have resolved because of this. He hears some voices but they are very quiet. He denies command hallucinations. He states that he is having some racing thoughts in the morning and usually he takes Seroquel during the day to help with this. He would like a dose of Zyprexa in the morning. He states that overall everything is improving and he is looking forward to the next step and getting sober. He is organized and goal directed in conversation.   Principal Problem: Severe recurrent major depressive disorder with psychotic features ([place]) Diagnosis:   Patient Active Problem List   Diagnosis Date Noted  . Severe recurrent major depressive disorder with psychotic features ([place]) [F33.3] 09/15/2017    Priority: High  . Suicidal ideation [R45.851] 03/13/2016  . Cocaine abuse without complication ([place]) [Q94.76] 05/23/2011   Total Time spent with patient: 20 minutes  Past Psychiatric History: See H&P  Past Medical History:  Past Medical History:  Diagnosis Date  . Depression   . MDD (major depressive disorder), recurrent severe, without psychosis ([place]) 03/12/2016  . Schizoaffective disorder, bipolar type ([place]) 06/22/2017   History reviewed. No pertinent surgical history. Family History: History reviewed. No pertinent family history. Family Psychiatric  History: See H&P Social History:  Social History   Substance and Sexual Activity  Alcohol Use Yes   Comment: occasional     Social History   Substance and Sexual Activity  Drug Use Yes  . Types: Cocaine, Marijuana, "Crack" cocaine   Comment: daily use x 1 month    Social History   Socioeconomic History  . Marital status: Widowed    Spouse name: Not on file  . Number of children: Not on file  .  Years of education: Not on file  . Highest education level: Not on file  Occupational History  . Not on file  Social Needs  . Financial resource strain: Not on file  . Food insecurity:    Worry: Not on file    Inability: Not on file  . Transportation needs:    Medical: Not on file    Non-medical: Not on file  Tobacco Use  . Smoking status: Former Research scientist (life sciences)  . Smokeless tobacco: Never Used  Substance and Sexual Activity  . Alcohol use: Yes    Comment: occasional  . Drug use: Yes    Types: Cocaine, Marijuana, "Crack" cocaine    Comment: daily use x 1 month  . Sexual activity: Yes  Lifestyle  . Physical activity:    Days per week: Not on file    Minutes per session: Not on file  . Stress: Not on file  Relationships  . Social connections:    Talks on phone: Not on file    Gets together: Not on file    Attends religious service: Not on file    Active member of club or organization: Not on file    Attends meetings of clubs or organizations: Not on file    Relationship status: Not on file  Other Topics Concern  . Not on file  Social History Narrative  . Not on file   Additional Social History:  Sleep: Fair  Appetite:  Good  Current Medications: Current Facility-Administered Medications  Medication Dose Route Frequency Provider Last Rate Last Dose  . alum & mag hydroxide-simeth (MAALOX/MYLANTA) 200-200-20 MG/5ML suspension 30 mL  30 mL Oral Q4H PRN Pucilowska, Jolanta B, MD      . busPIRone (BUSPAR) tablet 7.5 mg  7.5 mg Oral BID ,  R, MD      . citalopram (CELEXA) tablet 20 mg  20 mg Oral Daily , Tyson Babinski, MD   20 mg at 09/16/17 5379  . feeding supplement (ENSURE ENLIVE) (ENSURE ENLIVE) liquid 237 mL  237 mL Oral BID BM ,  R, MD   237 mL at 09/16/17 1100  . hydrOXYzine (ATARAX/VISTARIL) tablet 25 mg  25 mg Oral TID PRN Pucilowska, Jolanta B, MD   25 mg at 09/16/17 1120  . magnesium hydroxide (MILK OF MAGNESIA)  suspension 30 mL  30 mL Oral Daily PRN Pucilowska, Jolanta B, MD      . OLANZapine (ZYPREXA) tablet 5 mg  5 mg Oral BH-q7a , Tyson Babinski, MD   5 mg at 09/16/17 1121  . OLANZapine (ZYPREXA) tablet 5 mg  5 mg Oral QHS , Tyson Babinski, MD      . traZODone (DESYREL) tablet 50 mg  50 mg Oral QHS PRN , Tyson Babinski, MD        Lab Results:  Results for orders placed or performed during the hospital encounter of 09/14/17 (from the past 48 hour(s))  CBC     Status: None   Collection Time: 09/15/17  7:18 AM  Result Value Ref Range   WBC 7.4 3.8 - 10.6 K/uL   RBC 5.23 4.40 - 5.90 MIL/uL   Hemoglobin 14.8 13.0 - 18.0 g/dL   HCT 44.2 40.0 - 52.0 %   MCV 84.5 80.0 - 100.0 fL   MCH 28.3 26.0 - 34.0 pg   MCHC 33.4 32.0 - 36.0 g/dL   RDW 13.3 11.5 - 14.5 %   Platelets 233 150 - 440 K/uL    Comment: Performed at [facility], [address] [place], [place] [address]  Comprehensive metabolic panel     Status: Abnormal   Collection Time: 09/15/17  7:18 AM  Result Value Ref Range   Sodium 139 135 - 145 mmol/L   Potassium 4.0 3.5 - 5.1 mmol/L   Chloride 107 101 - 111 mmol/L   CO2 26 22 - 32 mmol/L   Glucose, Bld 94 65 - 99 mg/dL   BUN 15 6 - 20 mg/dL   Creatinine, Ser 0.96 0.61 - 1.24 mg/dL   Calcium 8.4 (L) 8.9 - 10.3 mg/dL   Total Protein 6.7 6.5 - 8.1 g/dL   Albumin 3.7 3.5 - 5.0 g/dL   AST 38 15 - 41 U/L   ALT 39 17 - 63 U/L   Alkaline Phosphatase 92 38 - 126 U/L   Total Bilirubin 0.6 0.3 - 1.2 mg/dL   GFR calc non Af Amer >60 >60 mL/min   GFR calc Af Amer >60 >60 mL/min    Comment: (NOTE) The eGFR has been calculated using the CKD EPI equation. This calculation has not been validated in all clinical situations. eGFR's persistently <60 mL/min signify possible Chronic Kidney Disease.    Anion gap 6 5 - 15    Comment: Performed at [facility], [address] [place], [place] [address]    Blood Alcohol level:  Lab Results  Component Value Date   [facility] <5  03/12/2016   ETH <11 46/56/8127    Metabolic Disorder Labs: Lab Results  Component Value Date   HGBA1C 5.3 06/23/2017   MPG 105.41 06/23/2017   No results found for: PROLACTIN Lab Results  Component Value Date   CHOL 151 06/23/2017   TRIG 96 06/23/2017   HDL 61 06/23/2017   CHOLHDL 2.5 06/23/2017   VLDL 19 06/23/2017   LDLCALC 71 06/23/2017    Physical Findings: AIMS: Facial and Oral Movements Muscles of Facial Expression: None, normal Lips and Perioral Area: None, normal Jaw: None, normal Tongue: None, normal,Extremity Movements Upper (arms, wrists, hands, fingers): None, normal Lower (legs, knees, ankles, toes): None, normal, Trunk Movements Neck, shoulders, hips: None, normal, Overall Severity Severity of abnormal movements (highest score from questions above): None, normal Incapacitation due to abnormal movements: None, normal Patient's awareness of abnormal movements (rate only patient's report): No Awareness, Dental Status Current problems with teeth and/or dentures?: Yes(Crack cocaine damaged teeth) Does patient usually wear dentures?: No  CIWA:    COWS:     Musculoskeletal: Strength & Muscle Tone: Normal Gait & Station: normal Patient leans: N/A  Psychiatric Specialty Exam: Physical Exam  Nursing note and vitals reviewed.   Review of Systems  All other systems reviewed and are negative.   Blood pressure (!) 83/45, pulse (!) 107, temperature 98 F (36.7 C), temperature source Oral, resp. rate 19, height 6' 2" (1.88 m), weight 98 kg (216 lb), SpO2 99 %.Body mass index is 27.73 kg/m.  General Appearance: Casual  Eye Contact:  Good  Speech:  Clear and Coherent  Volume:  Normal  Mood:  Euthymic  Affect:  Appropriate  Thought Process:  Coherent and Goal Directed  Orientation:  Full (Time, Place, and Person)  Thought Content:  Logical  Suicidal Thoughts:  No  Homicidal Thoughts:  No  Memory:  Immediate;   Fair  Judgement:  Fair  Insight:  Fair   Psychomotor Activity:  Normal  Concentration:  Concentration: Fair  Recall:  [employer] of Knowledge:  Fair  Language:  Fair  Akathisia:  No      Assets:  Resilience  ADL's:  Intact  Cognition:  WNL  Sleep:  Number of Hours: 8.25     Treatment Plan Summary: 52 yo male admitted due to SI and [place]. He was accepted into [facility] and he is very excited about this. He is much more hopeful and SI is resolving.   Plan:  Mood/AH -Continue Celexa 20 mg daily -Decrease Buspar to 7.5 mg BID due to orthostatic hypotension -Switch Zyprexa to 5 mg BID as he would like a daytime dose -Decrease trazodone to 50 mg prn due to hypotension  Polysubstance abuse -He will be going to [employer] on Monday  Dispo -Discharge Monday to [facility]. He will need 30 day supply of medications.   Marylin Crosby, MD 09/16/2017, 12:53 PM

## 2017-09-16 NOTE — BHH Counselor (Signed)
CSW and patient completed the second phone interview for [facility]. [facility] is scheduled to arrive Monday at discharge. CSW will reach out to the charitable foundation to ask for assistance with clothing and IT consultanthygiene materials. Patient will need a 30 day supply of his medications in hand upon arrival. MD was informed.   Johny Shearsassandra Amarien Carne, MSW, [place]CSWA, LCASA 09/16/2017 10:35 AM

## 2017-09-17 NOTE — Plan of Care (Signed)
Stayed in room but pleasant and cooperative. Compliant with medications. Able to express needs

## 2017-09-17 NOTE — Progress Notes (Signed)
Recreation Therapy Notes   Date: 09/17/2017  Time: 9:30 am   Location: Craft Room   Behavioral response: N/A   Intervention Topic: Emotions  Discussion/Intervention: Patient did not attend group.   Clinical Observations/Feedback:  Patient did not attend group.   Kentrail Shew LRT/CTRS         Lovelee Forner 09/17/2017 10:28 AM 

## 2017-09-17 NOTE — Plan of Care (Signed)
  Problem: Spiritual Needs Goal: Ability to function at adequate level Outcome: Progressing Note:  Up to dayroom for meals.  Able to perform ADL's independantly   Problem: Education: Goal: Knowledge of [place] [facility] Education information/materials will improve Outcome: Progressing Goal: Emotional status will improve Outcome: Progressing Note:  Pleasant and cooperative. Rates depression as a 2/10.  Verbalized goal is to stay positive.   Goal: Mental status will improve Outcome: Progressing Note:  Pleasant and cooperative. Rates depression as a 2/10.  Verbalized goal is to stay positive.   Goal: Verbalization of understanding the information provided will improve Outcome: Progressing   Problem: Activity: Goal: Interest or engagement in activities will improve Outcome: Progressing Note:  Visible in the milieu although minimal interaction noted with patient   Problem: Coping: Goal: Ability to verbalize frustrations and anger appropriately will improve Outcome: Progressing Note:  Verbalizes feelings and concerns without difficulty Goal: Ability to demonstrate self-control will improve Outcome: Progressing   Problem: Health Behavior/Discharge Planning: Goal: Identification of resources available to assist in meeting health care needs will improve Outcome: Progressing Goal: Compliance with treatment plan for underlying cause of condition will improve Outcome: Progressing Note:  Compliant with treatment regiment   Problem: Physical Regulation: Goal: Ability to maintain clinical measurements within normal limits will improve Outcome: Progressing   Problem: Safety: Goal: Periods of time without injury will increase Outcome: Progressing Note:  Remains safe on the unit   Problem: Education: Goal: Utilization of techniques to improve thought processes will improve Outcome: Progressing Goal: Knowledge of the prescribed therapeutic regimen will improve Outcome:  Progressing   Problem: Activity: Goal: Interest or engagement in leisure activities will improve Outcome: Progressing   Problem: Coping: Goal: Coping ability will improve Outcome: Progressing Goal: Will verbalize feelings Outcome: Progressing   Problem: Health Behavior/Discharge Planning: Goal: Ability to make decisions will improve Outcome: Progressing Goal: Compliance with therapeutic regimen will improve Outcome: Progressing   Problem: Role Relationship: Goal: Will demonstrate positive changes in social behaviors and relationships Outcome: Progressing   Problem: Self-Concept: Goal: Will verbalize positive feelings about self Outcome: Progressing Goal: Level of anxiety will decrease Outcome: Progressing   Problem: Activity: Goal: Will identify at least one activity in which they can participate Outcome: Progressing   Problem: Coping: Goal: Ability to identify and develop effective coping behavior will improve Outcome: Progressing Goal: Ability to interact with others will improve Outcome: Progressing Goal: Demonstration of participation in decision-making regarding own care will improve Outcome: Progressing Goal: Ability to use eye contact when communicating with others will improve Outcome: Progressing   Problem: Health Behavior/Discharge Planning: Goal: Identification of resources available to assist in meeting health care needs will improve Outcome: Progressing   Problem: Self-Concept: Goal: Will verbalize positive feelings about self Outcome: Progressing   Problem: Education: Goal: Ability to make informed decisions regarding treatment will improve Outcome: Progressing   Problem: Coping: Goal: Coping ability will improve Outcome: Progressing   Problem: Health Behavior/Discharge Planning: Goal: Identification of resources available to assist in meeting health care needs will improve Outcome: Progressing   Problem: Medication: Goal: Compliance with  prescribed medication regimen will improve Outcome: Progressing   Problem: Self-Concept: Goal: Ability to disclose and discuss suicidal ideas will improve Outcome: Progressing Goal: Will verbalize positive feelings about self Outcome: Progressing   Problem: Elimination: Goal: Will not experience complications related to bowel motility Outcome: Progressing   Problem: Skin Integrity: Goal: Risk for impaired skin integrity will decrease Outcome: Progressing

## 2017-09-17 NOTE — Progress Notes (Signed)
[facility] MD Progress Note  09/17/2017 1:37 PM Logan [name]  MRN:  [id number]   Subjective:  Pt states taht he is doing well today. He likes having a dose of Zyprexa in the morning because it helps with racing thoughts. He denies Si or any thoughts of self harm. Denies AH today. He states that he is feeling much more hopeful today because he is going to [employer]. He is ready and motivated to get sober and move forward with his life. HE is sleeping fair.   Principal Problem: Severe recurrent major depressive disorder with psychotic features ([facility]) Diagnosis:   Patient Active Problem List   Diagnosis Date Noted  . Severe recurrent major depressive disorder with psychotic features ([facility]) [F33.3] 09/15/2017    Priority: High  . Suicidal ideation [R45.851] 03/13/2016  . Cocaine abuse without complication ([facility]) [F14.10] 05/23/2011   Total Time spent with patient: 20 minutes  Past Psychiatric History: See h&P  Past Medical History:  Past Medical History:  Diagnosis Date  . Depression   . 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 Schizoaffective disorder, bipolar type ([facility]) 06/22/2017   History reviewed. No pertinent surgical history. Family History: History reviewed. No pertinent family history. Family Psychiatric  History: See H&P Social History:  Social History   Substance and Sexual Activity  Alcohol Use Yes   Comment: occasional     Social History   Substance and Sexual Activity  Drug Use Yes  . Types: Cocaine, Marijuana, "Crack" cocaine   Comment: daily use x 1 month    Social History   Socioeconomic History  . Marital status: Widowed    Spouse name: Not on file  . Number of children: Not on file  . Years of education: Not on file  . Highest education level: Not on file  Occupational History  . Not on file  Social Needs  . Financial resource strain: Not on file  . Food insecurity:    Worry: Not on file   Inability: Not on file  . Transportation needs:    Medical: Not on file    Non-medical: Not on file  Tobacco Use  . Smoking status: Former Games developer  . Smokeless tobacco: Never Used  Substance and Sexual Activity  . Alcohol use: Yes    Comment: occasional  . Drug use: Yes    Types: Cocaine, Marijuana, "Crack" cocaine    Comment: daily use x 1 month  . Sexual activity: Yes  Lifestyle  . Physical activity:    Days per week: Not on file    Minutes per session: Not on file  . Stress: Not on file  Relationships  . Social connections:    Talks on phone: Not on file    Gets together: Not on file    Attends religious service: Not on file    Active member of club or organization: Not on file    Attends meetings of clubs or organizations: Not on file    Relationship status: Not on file  Other Topics Concern  . Not on file  Social History Narrative  . Not on file   Additional Social History:                         Sleep: Fair  Appetite:  Fair  Current Medications: Current Facility-Administered Medications  Medication Dose Route Frequency Provider Last Rate Last Dose  . alum & mag hydroxide-simeth (MAALOX/MYLANTA) 200-200-20 MG/5ML  suspension 30 mL  30 mL Oral Q4H PRN Pucilowska, Jolanta B, MD      . busPIRone (BUSPAR) tablet 7.5 mg  7.5 mg Oral BID Naeem Quillin, Ileene Hutchinson, MD   7.5 mg at 09/17/17 0902  . citalopram (CELEXA) tablet 20 mg  20 mg Oral Daily Melayna Robarts, Ileene Hutchinson, MD   20 mg at 09/17/17 0902  . feeding supplement (ENSURE ENLIVE) (ENSURE ENLIVE) liquid 237 mL  237 mL Oral BID BM Daeshawn Redmann R, MD   237 mL at 09/17/17 1132  . hydrOXYzine (ATARAX/VISTARIL) tablet 25 mg  25 mg Oral TID PRN Pucilowska, Jolanta B, MD   25 mg at 09/16/17 1120  . magnesium hydroxide (MILK OF MAGNESIA) suspension 30 mL  30 mL Oral Daily PRN Pucilowska, Jolanta B, MD      . OLANZapine (ZYPREXA) tablet 5 mg  5 mg Oral BH-q7a Haneefah Venturini, Ileene Hutchinson, MD   5 mg at 09/17/17 [phone]  . OLANZapine (ZYPREXA) tablet 5  mg  5 mg Oral QHS Zubair Lofton, Ileene Hutchinson, MD   5 mg at 09/16/17 2135  . traZODone (DESYREL) tablet 50 mg  50 mg Oral QHS PRN Haskell Riling, MD   50 mg at 09/16/17 2135    Lab Results: No results found for this or any previous visit (from the past 48 hour(s)).  Blood Alcohol level:  Lab Results  Component Value Date   [facility] <5 03/12/2016   ETH <11 05/22/2011    Metabolic Disorder Labs: Lab Results  Component Value Date   HGBA1C 5.3 06/23/2017   MPG 105.41 06/23/2017   No results found for: PROLACTIN Lab Results  Component Value Date   CHOL 151 06/23/2017   TRIG 96 06/23/2017   HDL 61 06/23/2017   CHOLHDL 2.5 06/23/2017   VLDL 19 06/23/2017   LDLCALC 71 06/23/2017    Physical Findings: AIMS: Facial and Oral Movements Muscles of Facial Expression: None, normal Lips and Perioral Area: None, normal Jaw: None, normal Tongue: None, normal,Extremity Movements Upper (arms, wrists, hands, fingers): None, normal Lower (legs, knees, ankles, toes): None, normal, Trunk Movements Neck, shoulders, hips: None, normal, Overall Severity Severity of abnormal movements (highest score from questions above): None, normal Incapacitation due to abnormal movements: None, normal Patient's awareness of abnormal movements (rate only patient's report): No Awareness, Dental Status Current problems with teeth and/or dentures?: Yes(Crack cocaine damaged teeth) Does patient usually wear dentures?: No  CIWA:    COWS:     Musculoskeletal: Strength & Muscle Tone: within normal limits Gait & Station: normal Patient leans: N/A  Psychiatric Specialty Exam: Physical Exam  Nursing note and vitals reviewed.   Review of Systems  All other systems reviewed and are negative.   Blood pressure 92/68, pulse (!) 106, temperature 98.5 F (36.9 C), temperature source Oral, resp. rate 18, height 6\' 2"  (1.88 m), weight 98 kg (216 lb), SpO2 97 %.Body mass index is 27.73 kg/m.  General Appearance: Casual  Eye Contact:   Good  Speech:  Clear and Coherent  Volume:  Normal  Mood:  Euthymic  Affect:  Appropriate  Thought Process:  Coherent and Goal Directed  Orientation:  Full (Time, Place, and Person)  Thought Content:  Logical  Suicidal Thoughts:  No  Homicidal Thoughts:  No  Memory:  Immediate;   Fair  Judgement:  Fair  Insight:  Fair  Psychomotor Activity:  Normal  Concentration:  Concentration: Fair  Recall:  [employer] of Knowledge:  Fair  Language:  Fair  Akathisia:  No      Assets:  Resilience  ADL's:  Intact  Cognition:  WNL  Sleep:  Number of Hours: 8.15     Treatment Plan Summary: 52 yo male admitted due to SI and AH. He is much more hopeful now that he got into a program for substance abuse. All symptoms are resolving. [facility]. BP is improved.  Plan:  Mood/AH -Continue Celexa 20 mg daily -Continue lower dose of Buspar 7.5 mg BID -Continue Zyprexa 5 mg BID -prn trazodone 50 mg   Polysubstance abuse [facility]  [facility] 30 day supply of medications sent to pharmacy  Haskell RilingHolly R Nandini Bogdanski, MD 09/17/2017, 1:37 PM

## 2017-09-17 NOTE — Progress Notes (Signed)
Patient stayed in bed but denied any concerns. Alert and oriented. Denying thoughts of self harm. NO hallucinations. Presented to the medication room and received medications as scheduled. Was encouraged to get more involved in activities. Staff continue to monitor.

## 2017-09-17 NOTE — BHH Group Notes (Signed)
09/17/2017 1PM  Type of Therapy and Topic:  Group Therapy:  Feelings around Relapse and Recovery  Participation Level:  Did Not Attend   Description of Group:    Patients in this group will discuss emotions they experience before and after a relapse. They will process how experiencing these feelings, or avoidance of experiencing them, relates to having a relapse. Facilitator will guide patients to explore emotions they have related to recovery. Patients will be encouraged to process which emotions are more powerful. They will be guided to discuss the emotional reaction significant others in their lives may have to patients' relapse or recovery. Patients will be assisted in exploring ways to respond to the emotions of others without this contributing to a relapse.  Therapeutic Goals: 1. Patient will identify two or more emotions that lead to a relapse for them 2. Patient will identify two emotions that result when they relapse 3. Patient will identify two emotions related to recovery 4. Patient will demonstrate ability to communicate their needs through discussion and/or role plays   Summary of Patient Progress: Patient was encouraged and invited to attend group. Patient did not attend group. Social worker will continue to encourage group participation in the future.      Therapeutic Modalities:   Cognitive Behavioral Therapy Solution-Focused Therapy Assertiveness Training Relapse Prevention Therapy   Johny ShearsCassandra  Ottis Vacha, Alexander MtLCSW 09/17/2017 2:47 PM

## 2017-09-18 NOTE — [facility] Group Notes (Signed)
LCSW Group Therapy Note  09/18/2017 1:15pm  Type of Therapy and Topic:  Group Therapy:  Cognitive Distortions  Participation Level:  Did Not Attend   Description of Group:    Patients in this group will be introduced to the topic of cognitive distortions.  Patients will identify and describe cognitive distortions, describe the feelings these distortions create for them.  Patients will identify one or more situations in their personal life where they have cognitively distorted thinking and will verbalize challenging this cognitive distortion through positive thinking skills.  Patients will practice the skill of using positive affirmations to challenge cognitive distortions using affirmation cards.    Therapeutic Goals:  1. Patient will identify two or more cognitive distortions they have used 2. Patient will identify one or more emotions that stem from use of a cognitive distortion 3. Patient will demonstrate use of a positive affirmation to counter a cognitive distortion through discussion and/or role play. 4. Patient will describe one way cognitive distortions can be detrimental to wellness   Summary of Patient Progress: Patient was invited to group but did not attend.       Therapeutic Modalities:   Cognitive Behavioral Therapy Motivational Interviewing   Calem Cocozza  CUEBAS-COLON, LCSW 09/18/2017 11:35 AM

## 2017-09-18 NOTE — Plan of Care (Signed)
  Problem: Education: Goal: Knowledge of [facility] Education information/materials will improve Outcome: Progressing Goal: Emotional status will improve Outcome: Progressing Note:  Bright affect.  Denies SI/HI/AVH.  Smiles with engagement.    Goal: Mental status will improve Outcome: Progressing Note:  Bright affect.  Denies SI/HI/AVH.  Smiles with engagement.   Verbalized goal for today is to stay positive.   Goal: Verbalization of understanding the information provided will improve Outcome: Progressing   Problem: Coping: Goal: Ability to verbalize frustrations and anger appropriately will improve Outcome: Progressing Note:  Verbalizes feelings without any difficulty Goal: Ability to demonstrate self-control will improve Outcome: Progressing   Problem: Physical Regulation: Goal: Ability to maintain clinical measurements within normal limits will improve Outcome: Progressing Note:  Medication compliant   Problem: Safety: Goal: Periods of time without injury will increase Outcome: Progressing Note:  Remains safe on the unit   Problem: Education: Goal: Knowledge of the prescribed therapeutic regimen will improve Outcome: Progressing   Problem: Coping: Goal: Coping ability will improve Outcome: Progressing Goal: Will verbalize feelings Outcome: Progressing

## 2017-09-18 NOTE — Plan of Care (Signed)
Patient is in his in bed alert not sleeping but stable, encourage patient to attend unit programs to improve his emotional and mental status, patient is not able to identify any coping skills or any positive attributes of self, no safety concerns, isolate to his room at present no social involvement. Denies any SI/HI and no signs of AVH, 15 minute rounding is maintained no distress noted. Problem: Education: Goal: Emotional status will improve Outcome: Progressing Goal: Mental status will improve Outcome: Progressing Goal: Verbalization of understanding the information provided will improve Outcome: Progressing   Problem: Activity: Goal: Interest or engagement in activities will improve Outcome: Progressing Goal: Sleeping patterns will improve Outcome: Progressing   Problem: Coping: Goal: Ability to verbalize frustrations and anger appropriately will improve Outcome: Progressing Goal: Ability to demonstrate self-control will improve Outcome: Progressing   Problem: Health Behavior/Discharge Planning: Goal: Identification of resources available to assist in meeting health care needs will improve Outcome: Progressing Goal: Compliance with treatment plan for underlying cause of condition will improve Outcome: Progressing   Problem: Physical Regulation: Goal: Ability to maintain clinical measurements within normal limits will improve Outcome: Progressing   Problem: Safety: Goal: Periods of time without injury will increase Outcome: Progressing   Problem: Education: Goal: Utilization of techniques to improve thought processes will improve Outcome: Progressing Goal: Knowledge of the prescribed therapeutic regimen will improve Outcome: Progressing   Problem: Health Behavior/Discharge Planning: Goal: Ability to make decisions will improve Outcome: Progressing Goal: Compliance with therapeutic regimen will improve Outcome: Progressing

## 2017-09-18 NOTE — Progress Notes (Signed)
[facility] MD Progress Note  09/18/2017 4:19 PM Logan [name]  MRN:  [id number]   Subjective:  Pt is lying in bed.  States he is doing okay, he is just trying to be positive.  Looking forward to going to rehab.  He stays in his room most of the time but does come out for food and group.  Denies depression, denies si/hi.   Principal Problem: Severe recurrent major depressive disorder with psychotic features (HCC) Diagnosis:   Patient Active Problem List   Diagnosis Date Noted  . Severe recurrent major depressive disorder with psychotic features (HCC) [F33.3] 09/15/2017  . Suicidal ideation [R45.851] 03/13/2016  . Cocaine abuse without complication (HCC) [F14.10] 05/23/2011   Total Time spent with patient: 20 minutes  Past Psychiatric History: See h&P  Past Medical History:  Past Medical History:  Diagnosis Date  . Depression   . 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 Schizoaffective disorder, bipolar type (HCC) 06/22/2017   History reviewed. No pertinent surgical history. Family History: History reviewed. No pertinent family history. Family Psychiatric  History: See H&P Social History:  Social History   Substance and Sexual Activity  Alcohol Use Yes   Comment: occasional     Social History   Substance and Sexual Activity  Drug Use Yes  . Types: Cocaine, Marijuana, "Crack" cocaine   Comment: daily use x 1 month    Social History   Socioeconomic History  . Marital status: Widowed    Spouse name: Not on file  . Number of children: Not on file  . Years of education: Not on file  . Highest education level: Not on file  Occupational History  . Not on file  Social Needs  . Financial resource strain: Not on file  . Food insecurity:    Worry: Not on file    Inability: Not on file  . Transportation needs:    Medical: Not on file    Non-medical: Not on file  Tobacco Use  . Smoking status: Former Games developer  . Smokeless  tobacco: Never Used  Substance and Sexual Activity  . Alcohol use: Yes    Comment: occasional  . Drug use: Yes    Types: Cocaine, Marijuana, "Crack" cocaine    Comment: daily use x 1 month  . Sexual activity: Yes  Lifestyle  . Physical activity:    Days per week: Not on file    Minutes per session: Not on file  . Stress: Not on file  Relationships  . Social connections:    Talks on phone: Not on file    Gets together: Not on file    Attends religious service: Not on file    Active member of club or organization: Not on file    Attends meetings of clubs or organizations: Not on file    Relationship status: Not on file  Other Topics Concern  . Not on file  Social History Narrative  . Not on file   Additional Social History:                         Sleep: Fair  Appetite:  Fair  Current Medications: Current Facility-Administered Medications  Medication Dose Route Frequency Provider Last Rate Last Dose  . alum & mag hydroxide-simeth (MAALOX/MYLANTA) 200-200-20 MG/5ML suspension 30 mL  30 mL Oral Q4H PRN Pucilowska, Jolanta B, MD   30 mL at 09/18/17 1513  . busPIRone (BUSPAR) tablet 7.5  mg  7.5 mg Oral BID Haskell Riling, MD   7.5 mg at 09/18/17 0837  . citalopram (CELEXA) tablet 20 mg  20 mg Oral Daily McNew, Ileene Hutchinson, MD   20 mg at 09/18/17 0836  . feeding supplement (ENSURE ENLIVE) (ENSURE ENLIVE) liquid 237 mL  237 mL Oral BID BM McNew, Holly R, MD   237 mL at 09/18/17 1141  . hydrOXYzine (ATARAX/VISTARIL) tablet 25 mg  25 mg Oral TID PRN Pucilowska, Jolanta B, MD   25 mg at 09/16/17 1120  . magnesium hydroxide (MILK OF MAGNESIA) suspension 30 mL  30 mL Oral Daily PRN Pucilowska, Jolanta B, MD      . OLANZapine (ZYPREXA) tablet 5 mg  5 mg Oral BH-q7a McNew, Ileene Hutchinson, MD   5 mg at 09/18/17 0644  . OLANZapine (ZYPREXA) tablet 5 mg  5 mg Oral QHS McNew, Ileene Hutchinson, MD   5 mg at 09/17/17 2136  . traZODone (DESYREL) tablet 50 mg  50 mg Oral QHS PRN Haskell Riling, MD   50 mg  at 09/16/17 2135    Lab Results: No results found for this or any previous visit (from the past 48 hour(s)).  Blood Alcohol level:  Lab Results  Component Value Date   [facility] <5 03/12/2016   ETH <11 05/22/2011    Metabolic Disorder Labs: Lab Results  Component Value Date   HGBA1C 5.3 06/23/2017   MPG 105.41 06/23/2017   No results found for: PROLACTIN Lab Results  Component Value Date   CHOL 151 06/23/2017   TRIG 96 06/23/2017   HDL 61 06/23/2017   CHOLHDL 2.5 06/23/2017   VLDL 19 06/23/2017   LDLCALC 71 06/23/2017    Physical Findings: AIMS: Facial and Oral Movements Muscles of Facial Expression: None, normal Lips and Perioral Area: None, normal Jaw: None, normal Tongue: None, normal,Extremity Movements Upper (arms, wrists, hands, fingers): None, normal Lower (legs, knees, ankles, toes): None, normal, Trunk Movements Neck, shoulders, hips: None, normal, Overall Severity Severity of abnormal movements (highest score from questions above): None, normal Incapacitation due to abnormal movements: None, normal Patient's awareness of abnormal movements (rate only patient's report): No Awareness, Dental Status Current problems with teeth and/or dentures?: Yes(Crack cocaine damaged teeth) Does patient usually wear dentures?: No  CIWA:    COWS:     Musculoskeletal: Strength & Muscle Tone: within normal limits Gait & Station: normal Patient leans: N/A  Psychiatric Specialty Exam: Physical Exam  Nursing note and vitals reviewed.   Review of Systems  All other systems reviewed and are negative.   Blood pressure 97/77, pulse (!) 107, temperature 97.7 F (36.5 C), temperature source Oral, resp. rate 18, height  (1.88 m), weight 98 kg (216 lb), SpO2 98 %.Body mass index is 27.73 kg/m.  General Appearance: Casual  Eye Contact:  Good  Speech:  Clear and Coherent  Volume:  Normal  Mood:  Euthymic  Affect:  Appropriate  Thought Process:  Coherent and Goal Directed   Orientation:  Full (Time, Place, and Person)  Thought Content:  Logical  Suicidal Thoughts:  No  Homicidal Thoughts:  No  Memory:  Immediate;   Fair  Judgement:  Fair  Insight:  Fair  Psychomotor Activity:  Normal  Concentration:  Concentration: Fair  Recall:  [employer] of Knowledge:  Fair  Language:  Fair  Akathisia:  No      Assets:  Resilience  ADL's:  Intact  Cognition:  WNL  Sleep:  Number  of Hours: 9     Treatment Plan Summary: 52 yo male admitted due to SI and AH. He is much more hopeful now that he got into a program for substance abuse. All symptoms are resolving. He is ready to go to [facility] on Monday. BP is WNL  Plan:  Mood/AH -Continue Celexa 20 mg daily -Continue lower dose of Buspar 7.5 mg BID -Continue Zyprexa 5 mg BID -prn trazodone 50 mg   Polysubstance abuse -He will be going to [facility] on Monday  Dispo -Discharge Monday to [facility]. 30 day supply of medications sent to pharmacy  Cindee Lame, MD 09/18/2017, 4:19 PM

## 2017-09-19 NOTE — Progress Notes (Signed)
D- Patient alert and oriented. Patient presents in a pleasant mood on assessment stating that he slept good "I only woke up once". Patient denies SI, HI, AVH, and pain at this time. Patient rates his depression and anxiety a "2/10" stating to this writer "I'm ready to go to rehab tomorrow". Patient's goal for today is to "stay positive", in which he will "take meds" in order to achieve this goal.  A- Scheduled medications administered to patient, per MD orders. Support and encouragement provided.  Routine safety checks conducted every 15 minutes.  Patient informed to notify staff with problems or concerns.  R- No adverse drug reactions noted. Patient contracts for safety at this time. Patient compliant with medications and treatment plan. Patient receptive, calm, and cooperative. Patient interacts well with others on the unit.  Patient remains safe at this time.

## 2017-09-19 NOTE — [facility] Group Notes (Signed)
LCSW Group Therapy Note 09/19/2017 1:15pm  Type of Therapy and Topic: Group Therapy: Feelings Around Returning Home & Establishing a Supportive Framework and Supporting Oneself When Supports Not Available  Participation Level: Did Not Attend  Description of Group:  Patients first processed thoughts and feelings about upcoming discharge. These included fears of upcoming changes, lack of change, new living environments, judgements and expectations from others and overall stigma of mental health issues. The group then discussed the definition of a supportive framework, what that looks and feels like, and how do to discern it from an unhealthy non-supportive network. The group identified different types of supports as well as what to do when your family/friends are less than helpful or unavailable  Therapeutic Goals  1. Patient will identify one healthy supportive network that they can use at discharge. 2. Patient will identify one factor of a supportive framework and how to tell it from an unhealthy network. 3. Patient able to identify one coping skill to use when they do not have positive supports from others. 4. Patient will demonstrate ability to communicate their needs through discussion and/or role plays.  Summary of Patient Progress:  Pt was invited to group but did not attend.   Therapeutic Modalities Cognitive Behavioral Therapy Motivational Interviewing   Joh Rao  CUEBAS-COLON, LCSW 09/19/2017 12:08 PM

## 2017-09-19 NOTE — Progress Notes (Signed)
[facility] MD Progress Note  09/19/2017 5:07 PM Logan [name]  MRN:  [id number]   Subjective:  Pt is walking around his room.  He states he is trying to stay positive, looking forward to going to rehab "a fresh start." No complaints, no si/hi.  Principal Problem: Severe recurrent major depressive disorder with psychotic features (HCC) Diagnosis:   Patient Active Problem List   Diagnosis Date Noted  . Severe recurrent major depressive disorder with psychotic features (HCC) [F33.3] 09/15/2017  . Suicidal ideation [R45.851] 03/13/2016  . Cocaine abuse without complication (HCC) [F14.10] 05/23/2011   Total Time spent with patient: 20 minutes  Past Psychiatric History: See h&P  Past Medical History:  Past Medical History:  Diagnosis Date  . Depression   . 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 Schizoaffective disorder, bipolar type (HCC) 06/22/2017   History reviewed. No pertinent surgical history. Family History: History reviewed. No pertinent family history. Family Psychiatric  History: See H&P Social History:  Social History   Substance and Sexual Activity  Alcohol Use Yes   Comment: occasional     Social History   Substance and Sexual Activity  Drug Use Yes  . Types: Cocaine, Marijuana, "Crack" cocaine   Comment: daily use x 1 month    Social History   Socioeconomic History  . Marital status: Widowed    Spouse name: Not on file  . Number of children: Not on file  . Years of education: Not on file  . Highest education level: Not on file  Occupational History  . Not on file  Social Needs  . Financial resource strain: Not on file  . Food insecurity:    Worry: Not on file    Inability: Not on file  . Transportation needs:    Medical: Not on file    Non-medical: Not on file  Tobacco Use  . Smoking status: Former Games developer  . Smokeless tobacco: Never Used  Substance and Sexual Activity  . Alcohol use: Yes     Comment: occasional  . Drug use: Yes    Types: Cocaine, Marijuana, "Crack" cocaine    Comment: daily use x 1 month  . Sexual activity: Yes  Lifestyle  . Physical activity:    Days per week: Not on file    Minutes per session: Not on file  . Stress: Not on file  Relationships  . Social connections:    Talks on phone: Not on file    Gets together: Not on file    Attends religious service: Not on file    Active member of club or organization: Not on file    Attends meetings of clubs or organizations: Not on file    Relationship status: Not on file  Other Topics Concern  . Not on file  Social History Narrative  . Not on file   Additional Social History:                         Sleep: Fair  Appetite:  Fair  Current Medications: Current Facility-Administered Medications  Medication Dose Route Frequency Provider Last Rate Last Dose  . alum & mag hydroxide-simeth (MAALOX/MYLANTA) 200-200-20 MG/5ML suspension 30 mL  30 mL Oral Q4H PRN Pucilowska, Jolanta B, MD   30 mL at 09/19/17 1657  . busPIRone (BUSPAR) tablet 7.5 mg  7.5 mg Oral BID McNew, Ileene Hutchinson, MD   7.5 mg at 09/19/17 1657  . citalopram (CELEXA)  tablet 20 mg  20 mg Oral Daily McNew, Ileene Hutchinson, MD   20 mg at 09/19/17 0807  . feeding supplement (ENSURE ENLIVE) (ENSURE ENLIVE) liquid 237 mL  237 mL Oral BID BM McNew, Holly R, MD   237 mL at 09/19/17 1100  . hydrOXYzine (ATARAX/VISTARIL) tablet 25 mg  25 mg Oral TID PRN Pucilowska, Jolanta B, MD   25 mg at 09/16/17 1120  . magnesium hydroxide (MILK OF MAGNESIA) suspension 30 mL  30 mL Oral Daily PRN Pucilowska, Jolanta B, MD      . OLANZapine (ZYPREXA) tablet 5 mg  5 mg Oral BH-q7a McNew, Ileene Hutchinson, MD   5 mg at 09/19/17 0655  . OLANZapine (ZYPREXA) tablet 5 mg  5 mg Oral QHS McNew, Ileene Hutchinson, MD   5 mg at 09/18/17 2212  . traZODone (DESYREL) tablet 50 mg  50 mg Oral QHS PRN Haskell Riling, MD   50 mg at 09/16/17 2135    Lab Results: No results found for this or any  previous visit (from the past 48 hour(s)).  Blood Alcohol level:  Lab Results  Component Value Date   [facility] <5 03/12/2016   ETH <11 05/22/2011    Metabolic Disorder Labs: Lab Results  Component Value Date   HGBA1C 5.3 06/23/2017   MPG 105.41 06/23/2017   No results found for: PROLACTIN Lab Results  Component Value Date   CHOL 151 06/23/2017   TRIG 96 06/23/2017   HDL 61 06/23/2017   CHOLHDL 2.5 06/23/2017   VLDL 19 06/23/2017   LDLCALC 71 06/23/2017    Physical Findings: AIMS: Facial and Oral Movements Muscles of Facial Expression: None, normal Lips and Perioral Area: None, normal Jaw: None, normal Tongue: None, normal,Extremity Movements Upper (arms, wrists, hands, fingers): None, normal Lower (legs, knees, ankles, toes): None, normal, Trunk Movements Neck, shoulders, hips: None, normal, Overall Severity Severity of abnormal movements (highest score from questions above): None, normal Incapacitation due to abnormal movements: None, normal Patient's awareness of abnormal movements (rate only patient's report): No Awareness, Dental Status Current problems with teeth and/or dentures?: Yes(Crack cocaine damaged teeth) Does patient usually wear dentures?: No  CIWA:    COWS:     Musculoskeletal: Strength & Muscle Tone: within normal limits Gait & Station: normal Patient leans: N/A  Psychiatric Specialty Exam: Physical Exam  Nursing note and vitals reviewed.   Review of Systems  All other systems reviewed and are negative.   Blood pressure 108/72, pulse 83, temperature 97.8 F (36.6 C), temperature source Oral, resp. rate 17, height  (1.88 m), weight 98 kg (216 lb), SpO2 98 %.Body mass index is 27.73 kg/m.  General Appearance: Casual  Eye Contact:  Good  Speech:  Clear and Coherent  Volume:  Normal  Mood:  Euthymic  Affect:  Appropriate  Thought Process:  Coherent and Goal Directed  Orientation:  Full (Time, Place, and Person)  Thought Content:  Logical   Suicidal Thoughts:  No  Homicidal Thoughts:  No  Memory:  Immediate;   Fair  Judgement:  Fair  Insight:  Fair  Psychomotor Activity:  Normal  Concentration:  Concentration: Fair  Recall:  [employer] of Knowledge:  Fair  Language:  Fair  Akathisia:  No      Assets:  Resilience  ADL's:  Intact  Cognition:  WNL  Sleep:  Number of Hours: 7.15     Treatment Plan Summary: 52 yo male admitted due to SI and AH. He is much  more hopeful now that he got into a program for substance abuse. All symptoms are resolving. He is ready to go to [facility] on Monday. BP is WNL  Plan:  Mood/AH -Continue Celexa 20 mg daily -Continue lower dose of Buspar 7.5 mg BID -Continue Zyprexa 5 mg BID -prn trazodone 50 mg   Polysubstance abuse -He will be going to [facility] on Monday  Dispo -Discharge Monday to [facility]. 30 day supply of medications sent to pharmacy  Cindee Lame, MD 09/19/2017, 5:07 PM

## 2017-09-19 NOTE — Plan of Care (Signed)
Patient has been functioning at an adequate level on the unit and verbalizes understanding of the information that's been provided to him and he has not voiced any further questions or concerns at this time. Patient denies SI/HI/AVH at this time. Patient rates his anxiety/depression level a "2/10" stating to this writer he's feeling this way "I'm ready to go to rehab tomorrow". Patient has been isolated to his room today except for meal/snack times. Patient stated to this writer that "I only woke up once" last night. Patient has the ability to verbalize his anger/frustrations appropriately. Patient has been working towards maintaining his clinical measurements within normal limits and has been free from injury thus far. Patient has been in compliance with and verbalizes understanding of his prescribed therapeutic/medication regimen and has not voiced any further questions or concerns at this time.  Patient has the ability to cope and states to this writer that his goal for today is to "stay positive". Patient has the ability to make decisions regarding his care as well as the ability to utilize his support systems that promote safety. Patient has interacted well with staff and other members on the unit without any issues thus far. Patient has used fair eye contact when communicating with this Clinical research associate.  Patient has been free from any health-related complications and remains safe on the unit at this time.  Problem: Education: Goal: Knowledge of [facility] Education information/materials will improve Outcome: Progressing Goal: Emotional status will improve Outcome: Progressing Goal: Mental status will improve Outcome: Progressing Goal: Verbalization of understanding the information provided will improve Outcome: Progressing   Problem: Activity: Goal: Interest or engagement in activities will improve Outcome: Progressing Goal: Sleeping patterns will improve Outcome: Progressing   Problem:  Coping: Goal: Ability to verbalize frustrations and anger appropriately will improve Outcome: Progressing Goal: Ability to demonstrate self-control will improve Outcome: Progressing   Problem: Health Behavior/Discharge Planning: Goal: Identification of resources available to assist in meeting health care needs will improve Outcome: Progressing Goal: Compliance with treatment plan for underlying cause of condition will improve Outcome: Progressing   Problem: Physical Regulation: Goal: Ability to maintain clinical measurements within normal limits will improve Outcome: Progressing   Problem: Safety: Goal: Periods of time without injury will increase Outcome: Progressing   Problem: Education: Goal: Utilization of techniques to improve thought processes will improve Outcome: Progressing Goal: Knowledge of the prescribed therapeutic regimen will improve Outcome: Progressing   Problem: Activity: Goal: Interest or engagement in leisure activities will improve Outcome: Progressing Goal: Imbalance in normal sleep/wake cycle will improve Outcome: Progressing   Problem: Coping: Goal: Coping ability will improve Outcome: Progressing Goal: Will verbalize feelings Outcome: Progressing   Problem: Health Behavior/Discharge Planning: Goal: Ability to make decisions will improve Outcome: Progressing Goal: Compliance with therapeutic regimen will improve Outcome: Progressing   Problem: Role Relationship: Goal: Will demonstrate positive changes in social behaviors and relationships Outcome: Progressing   Problem: Safety: Goal: Ability to disclose and discuss suicidal ideas will improve Outcome: Progressing Goal: Ability to identify and utilize support systems that promote safety will improve Outcome: Progressing   Problem: Self-Concept: Goal: Will verbalize positive feelings about self Outcome: Progressing Goal: Level of anxiety will decrease Outcome: Progressing   Problem:  Activity: Goal: Will identify at least one activity in which they can participate Outcome: Progressing   Problem: Coping: Goal: Ability to identify and develop effective coping behavior will improve Outcome: Progressing Goal: Ability to interact with others will improve Outcome: Progressing Goal: Demonstration of participation  in decision-making regarding own care will improve Outcome: Progressing Goal: Ability to use eye contact when communicating with others will improve Outcome: Progressing   Problem: Health Behavior/Discharge Planning: Goal: Identification of resources available to assist in meeting health care needs will improve Outcome: Progressing   Problem: Self-Concept: Goal: Will verbalize positive feelings about self Outcome: Progressing   Problem: Education: Goal: Ability to make informed decisions regarding treatment will improve Outcome: Progressing   Problem: Coping: Goal: Coping ability will improve Outcome: Progressing   Problem: Health Behavior/Discharge Planning: Goal: Identification of resources available to assist in meeting health care needs will improve Outcome: Progressing   Problem: Medication: Goal: Compliance with prescribed medication regimen will improve Outcome: Progressing   Problem: Self-Concept: Goal: Ability to disclose and discuss suicidal ideas will improve Outcome: Progressing Goal: Will verbalize positive feelings about self Outcome: Progressing

## 2017-09-19 NOTE — Plan of Care (Addendum)
Patient found in bed awake upon my arrival. Patient is neither visible nor social this evening. Reports mood is "fine." Reports only low levels of depression and anxiety. Denies SI/AVH/HI. Denies pain. Minimally verbal throughout assessment. Reports eating and voiding adequately. Compliant with HS medications. Q 15 minute checks maintained. Will continue to monitor throughout the shift. Patient slept 7 hours. No apparent distress. Compliant with am medication and VS. Patient experienced hypotension upon standing. Encouraged fluids and rising slowly. Patient verbalized understanding. Will endorse care to oncoming shift.  Problem: Education: Goal: Knowledge of [facility] Education information/materials will improve Outcome: Progressing Goal: Emotional status will improve Outcome: Progressing Goal: Mental status will improve Outcome: Progressing Goal: Verbalization of understanding the information provided will improve Outcome: Progressing   Problem: Coping: Goal: Ability to verbalize frustrations and anger appropriately will improve Outcome: Progressing   Problem: Health Behavior/Discharge Planning: Goal: Compliance with therapeutic regimen will improve Outcome: Progressing   Problem: Coping: Goal: Ability to use eye contact when communicating with others will improve Outcome: Progressing   Problem: Activity: Goal: Interest or engagement in activities will improve Outcome: Not Progressing Goal: Sleeping patterns will improve Outcome: Not Progressing

## 2017-09-20 MED ORDER — IBUPROFEN 200 MG PO TABS
400.0000 mg | ORAL_TABLET | Freq: Four times a day (QID) | ORAL | Status: DC | PRN
Start: 2017-09-20 — End: 2017-09-20
  Administered 2017-09-20: 400 mg via ORAL
  Filled 2017-09-20: qty 2

## 2017-09-20 NOTE — Progress Notes (Signed)
Recreation Therapy Notes  INPATIENT RECREATION TR PLAN  Patient Details Name: Logan [name] MRN: [id number] DOB: 1966/05/03 Today's Date: 09/20/2017  Rec Therapy Plan Is patient appropriate for Therapeutic Recreation?: Yes Treatment times per week: At least 3 Estimated Length of Stay: 5-7 days TR Treatment/Interventions: Group participation (Comment)  Discharge Criteria Pt will be discharged from therapy if:: Discharged Treatment plan/goals/alternatives discussed and agreed upon by:: Patient/family  Discharge Summary Short term goals set: Patient will identify benefit of making healthy decisions post d/c within 5 recreation therapy group sessions Short term goals met: Not met Reason goals not met: Patient did not attend any groups. Therapeutic equipment acquired: N/A Reason patient discharged from therapy: Discharge from hospital Pt/family agrees with progress & goals achieved: Yes Date patient discharged from therapy: 09/13/17   Logan Hudson 09/20/2017, 4:27 PM

## 2017-09-20 NOTE — Progress Notes (Signed)
D- Patient alert and oriented. Patient presents in a pleasant mood on assessment stating that he slept "pretty good" last night and has no major complaints. Patient rates his depression a "2/10" stating to this writer he's feeling this way "I guess because I'm leaving going to rehab, something new". Patient denies SI, HI, AVH, and pain at the time of assessment. Patient's goal for today is to "stay positive with the whole situation".   A- Scheduled medications administered to patient, per MD orders. Support and encouragement provided.  Routine safety checks conducted every 15 minutes.  Patient informed to notify staff with problems or concerns.  R- No adverse drug reactions noted. Patient contracts for safety at this time. Patient compliant with medications and treatment plan. Patient receptive, calm, and cooperative. Patient interacts well with others on the unit.  Patient remains safe at this time.

## 2017-09-20 NOTE — Progress Notes (Signed)
Patient ID: Logan Cliff., male   DOB: 21-Nov-1965, 52 y.o.   MRN: [id number] Discharge Note:  Patient denies SI/HI/AVH at this time. Discharge instructions, AVS, prescriptions, and transition record gone over with patient. Patient agrees to comply with medication management, follow-up visit, and outpatient therapy. Patient belongings returned to patient. Patient questions and concerns addressed and answered. Patient ambulatory off unit. Patient discharged to bus stop.

## 2017-09-20 NOTE — Progress Notes (Signed)
  [facility] Adult Case Management Discharge Plan :  Will you be returning to the same living situation after discharge:  Yes,  Going to [facility] or No. At discharge, do you have transportation home?: Yes,    Do you have the ability to pay for your medications: Yes,     Release of information consent forms completed and in the chart;  Patient's signature needed at discharge.  Patient to Follow up at: Follow-up Information    [facility]. Go on 09/20/2017.   Why:  Please arrive for your scheduled intake directly after discharge on Monday September 20, 2017. Thank you. Contact information: Address: [address], [place] [address] Phone: [phone]          Next level of care provider has access to [facility] Link:no  Safety Planning and Suicide Prevention discussed: Yes,     Have you used any form of tobacco in the last 30 days? (Cigarettes, Smokeless Tobacco, Cigars, and/or Pipes): No  Has patient been referred to the Quitline?: N/A patient is not a smoker  Patient has been referred for addiction treatment: Yes  Glennon Mac, LCSW 09/20/2017, 9:57 AM

## 2017-09-20 NOTE — Progress Notes (Signed)
Recreation Therapy Notes  Date: 09/20/2017  Time: 9:30 am   Location: Craft Room   Behavioral response: N/A   Intervention Topic: Coping skills  Discussion/Intervention: Patient did not attend group.   Clinical Observations/Feedback:  Patient did not attend group.   Teanna Elem LRT/CTRS        Naiah Donahoe 09/20/2017 11:39 AM 

## 2017-09-20 NOTE — [facility] Suicide Risk Assessment (Signed)
[facility] Discharge Suicide Risk Assessment   Principal Problem: Severe recurrent major depressive disorder with psychotic features [facility]) Discharge Diagnoses:  Patient Active Problem List   Diagnosis Date Noted  . Severe recurrent major depressive disorder with psychotic features (HCC) [F33.3] 09/15/2017    Priority: High  . Suicidal ideation [R45.851] 03/13/2016  . Cocaine abuse without complication [facility]) [F14.10] 05/23/2011    Mental Status Per Nursing Assessment::   On Admission:  Suicidal ideation indicated by patient, Suicide plan, Self-harm thoughts, Intention to act on suicide plan  Demographic Factors:  Male, Caucasian and Unemployed  Loss Factors: Financial problems/change in socioeconomic status  Historical Factors: Prior suicide attempts and Impulsivity  Risk Reduction Factors:   Positive coping skills or problem solving skills  Continued Clinical Symptoms:  Alcohol/Substance Abuse/Dependencies  Cognitive Features That Contribute To Risk:  None    Suicide Risk:  Minimal: No identifiable suicidal ideation.  Patients presenting with no risk factors but with morbid ruminations; may be classified as minimal risk based on the severity of the depressive symptoms  Follow-up Information    [facility]. Go on 09/20/2017.   Why:  Please arrive for your scheduled intake directly after discharge on Monday September 20, 2017. Thank you. Contact information: Address: [address], [place] [address] Phone: [phone]          Plan Of Care/Follow-up recommendations: See above Haskell Riling, MD 09/20/2017, 9:13 AM

## 2017-09-20 NOTE — Discharge Summary (Signed)
Physician Discharge Summary Note  Patient:  Logan [name] is an 52 y.o., male MRN:  [id number] DOB:  1966-03-20 Patient phone:  [phone] (home)  Patient address:   [place] [address],  Total Time spent with patient: 20 minutes Plus 20 minutes of medication reconciliation, discharge planning, and discharge documentation  Date of Admission:  09/14/2017 Date of Discharge: 09/20/17  Reason for Admission:  Suicidal thoughts and AH  Principal Problem: Severe recurrent major depressive disorder with psychotic features [facility]) Discharge Diagnoses: Patient Active Problem List   Diagnosis Date Noted  . Severe recurrent major depressive disorder with psychotic features (HCC) [F33.3] 09/15/2017    Priority: High  . Suicidal ideation [R45.851] 03/13/2016  . Cocaine abuse without complication [facility]) [F14.10] 05/23/2011    Past Psychiatric History: See H&P  Past Medical History:  Past Medical History:  Diagnosis Date  . Depression   . 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 Schizoaffective disorder, bipolar type (HCC) 06/22/2017   History reviewed. No pertinent surgical history. Family History: History reviewed. No pertinent family history. Family Psychiatric  History: See H&P Social History:  Social History   Substance and Sexual Activity  Alcohol Use Yes   Comment: occasional     Social History   Substance and Sexual Activity  Drug Use Yes  . Types: Cocaine, Marijuana, "Crack" cocaine   Comment: daily use x 1 month    Social History   Socioeconomic History  . Marital status: Widowed    Spouse name: Not on file  . Number of children: Not on file  . Years of education: Not on file  . Highest education level: Not on file  Occupational History  . Not on file  Social Needs  . Financial resource strain: Not on file  . Food insecurity:    Worry: Not on file    Inability: Not on file  . Transportation  needs:    Medical: Not on file    Non-medical: Not on file  Tobacco Use  . Smoking status: Former Games developer  . Smokeless tobacco: Never Used  Substance and Sexual Activity  . Alcohol use: Yes    Comment: occasional  . Drug use: Yes    Types: Cocaine, Marijuana, "Crack" cocaine    Comment: daily use x 1 month  . Sexual activity: Yes  Lifestyle  . Physical activity:    Days per week: Not on file    Minutes per session: Not on file  . Stress: Not on file  Relationships  . Social connections:    Talks on phone: Not on file    Gets together: Not on file    Attends religious service: Not on file    Active member of club or organization: Not on file    Attends meetings of clubs or organizations: Not on file    Relationship status: Not on file  Other Topics Concern  . Not on file  Social History Narrative  . Not on file    Hospital Course:  Pt was restarted on home medications including Celexa and Buspar. Buspar was lowered due to hypotension. He wanted to switch Seroquel because he was not able to afford it so was started don Zyprexa. He was accepted into [facility] for treatment and was really excited about this. He was calm and pleasant on the unit. After he was accepted, he stated that SI and AH completely resolved. He was looking for a fresh start. On day of  discharge, he had bright affect and was smiling. HE was looking forward to a new start to his life and getting clean. HE felt the medications were helpful for him. He denied SI or any thoughts of self harm. AH completely resolved. HE was sleeping better and eating well. He was organized and goal directed. BP lower today but he is asymptomatic.   The patient is at low risk of imminent suicide. Patient denied thoughts, intent, or plan for harm to self or others, expressed significant future orientation, and expressed an ability to mobilize assistance for his needs. he is presently void of any contributing psychiatric symptoms,  cognitive difficulties, or substance use which would elevate his risk for lethality. Chronic risk for lethality is elevated in light of male gender, poor coping skills, impulsivity, drug use.  The chronic risk is presently mitigated by his ongoing desire and engagement in [facility] treatment and mobilization of support from family and friends. Chronic risk may elevate if he experiences any significant loss or worsening of symptoms, which can be managed and monitored through outpatient providers. At this time,a cute risk for lethality is low and he is stable for ongoing outpatient management.   Modifiable risk factors were addressed during this hospitalization through appropriate pharmacotherapy and establishment of outpatient follow-up treatment. Some risk factors for suicide are situational (i.e. Unstable housing) or related personality pathology (i.e. Poor coping mechanisms) and thus cannot be further mitigated by continued hospitalization in this setting.    Physical Findings: AIMS: Facial and Oral Movements Muscles of Facial Expression: None, normal Lips and Perioral Area: None, normal Jaw: None, normal Tongue: None, normal,Extremity Movements Upper (arms, wrists, hands, fingers): None, normal Lower (legs, knees, ankles, toes): None, normal, Trunk Movements Neck, shoulders, hips: None, normal, Overall Severity Severity of abnormal movements (highest score from questions above): None, normal Incapacitation due to abnormal movements: None, normal Patient's awareness of abnormal movements (rate only patient's report): No Awareness, Dental Status Current problems with teeth and/or dentures?: Yes(Crack cocaine damaged teeth) Does patient usually wear dentures?: No  CIWA:    COWS:     Musculoskeletal: Strength & Muscle Tone: within normal limits Gait & Station: normal Patient leans: N/A  Psychiatric Specialty Exam: Physical Exam  Nursing note and vitals reviewed.   Review of Systems  All other  systems reviewed and are negative.   Blood pressure (!) 86/42, pulse (!) 111, temperature 97.8 F (36.6 C), temperature source Oral, resp. rate 18, height  (1.88 m), weight 98 kg (216 lb), SpO2 99 %.Body mass index is 27.73 kg/m.  General Appearance: Casual  Eye Contact:  Good  Speech:  Clear and Coherent  Volume:  Normal  Mood:  Euthymic  Affect:  Congruent  Thought Process:  Coherent and Goal Directed  Orientation:  Full (Time, Place, and Person)  Thought Content:  Logical  Suicidal Thoughts:  No  Homicidal Thoughts:  No  Memory:  Immediate;   Fair  Judgement:  Fair  Insight:  Fair  Psychomotor Activity:  Normal  Concentration:  Concentration: Fair  Recall:  [employer] of Knowledge:  Fair  Language:  Fair  Akathisia:  No      Assets:  Resilience  ADL's:  Intact  Cognition:  WNL  Sleep:  Number of Hours: 7     Have you used any form of tobacco in the last 30 days? (Cigarettes, Smokeless Tobacco, Cigars, and/or Pipes): No  Has this patient used any form of tobacco in the last 30 days? (  Cigarettes, Smokeless Tobacco, Cigars, and/or Pipes)No  Blood Alcohol level:  Lab Results  Component Value Date   [facility] <5 03/12/2016   ETH <11 05/22/2011    Metabolic Disorder Labs:  Lab Results  Component Value Date   HGBA1C 5.3 06/23/2017   MPG 105.41 06/23/2017   No results found for: PROLACTIN Lab Results  Component Value Date   CHOL 151 06/23/2017   TRIG 96 06/23/2017   HDL 61 06/23/2017   CHOLHDL 2.5 06/23/2017   VLDL 19 06/23/2017   LDLCALC 71 06/23/2017    See Psychiatric Specialty Exam and Suicide Risk Assessment completed by Attending Physician prior to discharge.  Discharge destination:  Other:  [facility]  Is patient on multiple antipsychotic therapies at discharge:  No   Has Patient had three or more failed trials of antipsychotic monotherapy by history:  No  Recommended Plan for Multiple Antipsychotic Therapies: NA  Discharge Instructions     Increase activity slowly   Complete by:  As directed      Allergies as of 09/20/2017      Reactions   Asa [aspirin]    Nicotine    Paxil [paroxetine Hcl]    Tylenol [acetaminophen]       Medication List    STOP taking these medications   QUEtiapine 200 MG tablet Commonly known as:  SEROQUEL   QUEtiapine 50 MG tablet Commonly known as:  SEROQUEL     TAKE these medications     Indication  busPIRone 7.5 MG tablet Commonly known as:  BUSPAR Take 1 tablet (7.5 mg total) by mouth 2 (two) times daily. What changed:    medication strength  how much to take  Indication:  Symptoms of Feeling Anxious   citalopram 20 MG tablet Commonly known as:  CELEXA Take 1 tablet (20 mg total) by mouth daily.  Indication:  Depression   hydrOXYzine 25 MG tablet Commonly known as:  ATARAX/VISTARIL Take 1 tablet (25 mg total) by mouth 3 (three) times daily as needed for anxiety.  Indication:  Feeling Anxious   OLANZapine 5 MG tablet Commonly known as:  ZYPREXA Take 1 tablet (5 mg total) by mouth 2 (two) times daily.  Indication:  Voices   traZODone 50 MG tablet Commonly known as:  DESYREL Take 1 tablet (50 mg total) by mouth at bedtime. What changed:    medication strength  how much to take  Indication:  Trouble Sleeping      Follow-up Information    [facility]. Go on 09/20/2017.   Why:  Please arrive for your scheduled intake directly after discharge on Monday September 20, 2017. Thank you. Contact information: Address: [address] [place] [address] Phone: [phone]          Follow-up recommendations:  See above. He was given 30 day supply of medications from our pharmacy  Signed: Haskell Riling, MD 09/20/2017, 9:14 AM

## 2017-09-26 ENCOUNTER — Emergency Department (HOSPITAL_COMMUNITY)
Admission: EM | Admit: 2017-09-26 | Discharge: 2017-09-26 | Disposition: A | Discharge status: Home / Self Care | Payer: Self-pay | Attending: Emergency Medicine | Admitting: Emergency Medicine

## 2017-09-26 ENCOUNTER — Encounter (HOSPITAL_COMMUNITY): Payer: Self-pay | Admitting: Emergency Medicine

## 2017-09-26 ENCOUNTER — Emergency Department (HOSPITAL_COMMUNITY): Payer: Self-pay

## 2017-09-26 ENCOUNTER — Other Ambulatory Visit: Payer: Self-pay

## 2017-09-26 DIAGNOSIS — Z79899 Other long term (current) drug therapy: Secondary | ICD-10-CM | POA: Insufficient documentation

## 2017-09-26 DIAGNOSIS — S39012A Strain of muscle, fascia and tendon of lower back, initial encounter: Secondary | ICD-10-CM | POA: Insufficient documentation

## 2017-09-26 DIAGNOSIS — S300XXA Contusion of lower back and pelvis, initial encounter: Secondary | ICD-10-CM

## 2017-09-26 DIAGNOSIS — W11XXXA Fall on and from ladder, initial encounter: Secondary | ICD-10-CM | POA: Insufficient documentation

## 2017-09-26 DIAGNOSIS — Y939 Activity, unspecified: Secondary | ICD-10-CM | POA: Insufficient documentation

## 2017-09-26 DIAGNOSIS — Y999 Unspecified external cause status: Secondary | ICD-10-CM | POA: Insufficient documentation

## 2017-09-26 DIAGNOSIS — Y929 Unspecified place or not applicable: Secondary | ICD-10-CM | POA: Insufficient documentation

## 2017-09-26 DIAGNOSIS — Z87891 Personal history of nicotine dependence: Secondary | ICD-10-CM | POA: Insufficient documentation

## 2017-09-26 MED ORDER — IBUPROFEN 800 MG PO TABS: 800.0000 mg | ORAL_TABLET | Freq: Three times a day (TID) | ORAL | 0 refills | Status: DC | PRN

## 2017-09-26 MED ORDER — KETOROLAC TROMETHAMINE 60 MG/2ML IM SOLN
60.0000 mg | Freq: Once | INTRAMUSCULAR | Status: AC
  Administered 2017-09-26: 60 mg via INTRAMUSCULAR
  Filled 2017-09-26: qty 2

## 2017-09-26 MED ORDER — METAXALONE 800 MG PO TABS: 800.0000 mg | ORAL_TABLET | Freq: Three times a day (TID) | ORAL | 0 refills | Status: DC

## 2017-09-26 NOTE — ED Provider Notes (Signed)
[facility] EMERGENCY DEPARTMENT Provider Note   CSN: [id number] Arrival date & time: 09/26/17  2595     History   Chief Complaint Chief Complaint  Patient presents with  . Back Pain  . Fall    HPI Logan Jump. is a 52 y.o. male.  HPI Patient presents to the emergency department with injuries following a fall off of a ladder of about 4 feet on Wednesday.  Patient states that he fell backwards off a ladder, landing on the left side.  The patient states he did not hit his head became close.  The patient states that he is having lower thoracic and lower back pain.  Patient states he did take 600 mg of ibuprofen which did somewhat help his discomfort.  The patient states nothing seems to make the condition worse other than certain movements and palpation.  Patient states he did not take any other medications.  Patient denies numbness, weakness, dizziness, abdominal pain, dysuria, incontinence, fever, near-syncope or syncope. Past Medical History:  Diagnosis Date  . Depression   . 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 Schizoaffective disorder, bipolar type (HCC) 06/22/2017    Patient Active Problem List   Diagnosis Date Noted  . Severe recurrent major depressive disorder with psychotic features (HCC) 09/15/2017  . Suicidal ideation 03/13/2016  . Cocaine abuse without complication (HCC) 05/23/2011    History reviewed. No pertinent surgical history.      Home Medications    Prior to Admission medications   Medication Sig Start Date End Date Taking? Authorizing Provider  busPIRone (BUSPAR) 7.5 MG tablet Take 1 tablet (7.5 mg total) by mouth 2 (two) times daily. 09/16/17   McNew, Ileene Hutchinson, MD  citalopram (CELEXA) 20 MG tablet Take 1 tablet (20 mg total) by mouth daily. 09/17/17   McNew, Ileene Hutchinson, MD  hydrOXYzine (ATARAX/VISTARIL) 25 MG tablet Take 1 tablet (25 mg total) by mouth 3 (three) times daily as  needed for anxiety. 09/16/17   McNew, Ileene Hutchinson, MD  OLANZapine (ZYPREXA) 5 MG tablet Take 1 tablet (5 mg total) by mouth 2 (two) times daily. 09/16/17 09/16/18  McNew, Ileene Hutchinson, MD  traZODone (DESYREL) 50 MG tablet Take 1 tablet (50 mg total) by mouth at bedtime. 09/16/17   McNew, Ileene Hutchinson, MD    Family History No family history on file.  Social History Social History   Tobacco Use  . Smoking status: Former Games developer  . Smokeless tobacco: Never Used  Substance Use Topics  . Alcohol use: Not Currently    Comment: last drink Nov. 2018  . Drug use: Not Currently    Types: Cocaine, Marijuana, "Crack" cocaine    Comment: last use March 2019     Allergies   Asa [aspirin]; Nicotine; Paxil [paroxetine hcl]; and Tylenol [acetaminophen]   Review of Systems Review of Systems All other systems negative except as documented in the HPI. All pertinent positives and negatives as reviewed in the HPI.  Physical Exam Updated Vital Signs BP 129/83 (BP Location: Right Arm)   Pulse 85   Temp 98.3 F (36.8 C) (Oral)   Resp 18   Ht 6' (1.829 m)   Wt 104.3 kg (230 lb)   SpO2 98%   BMI 31.19 kg/m   Physical Exam  Constitutional: He is oriented to person, place, and time. He appears well-developed and well-nourished. No distress.  HENT:  Head: Normocephalic and atraumatic.  Eyes: Pupils are equal, round,  and reactive to light.  Cardiovascular: Normal rate and regular rhythm.  Pulmonary/Chest: Effort normal and breath sounds normal. No respiratory distress.  Neurological: He is alert and oriented to person, place, and time. He has normal strength. He displays normal reflexes. No sensory deficit. He exhibits normal muscle tone. Coordination and gait normal.  Skin: Skin is warm and dry.  Psychiatric: He has a normal mood and affect.  Nursing note and vitals reviewed.    ED Treatments / Results  Labs (all labs ordered are listed, but only abnormal results are displayed) Labs Reviewed - No data to  display  EKG None  Radiology Dg Lumbar Spine Complete  Result Date: 09/26/2017 CLINICAL DATA:  Mid to low back pain since falling from a ladder 4 days ago. Initial encounter. EXAM: LUMBAR SPINE - COMPLETE 4+ VIEW COMPARISON:  Radiographs 06/18/2006. FINDINGS: Five lumbar type vertebral bodies. The alignment is normal. The disc spaces are relatively preserved, although there is mild intervertebral spurring throughout the lumbar spine. There is mild facet hypertrophy. No evidence of acute fracture or pars defect. Small pelvic calcifications are likely phleboliths. IMPRESSION: No evidence of acute lumbar spine injury or malalignment. Mild spondylosis. Electronically Signed   By: Carey Bullocks M.D.   On: 09/26/2017 10:05    Procedures Procedures (including critical care time)  Medications Ordered in ED Medications  ketorolac (TORADOL) injection 60 mg (has no administration in time range)     Initial Impression / Assessment and Plan / ED Course  I have reviewed the triage vital signs and the nursing notes.  Pertinent labs & imaging results that were available during my care of the patient were reviewed by me and considered in my medical decision making (see chart for details).     Patient does not have any gait disturbance or neurological deficits noted on exam.  Patient does have palpable left-sided musculature discomfort on exam.  There is no midline tenderness.  The patient will be treated for his injuries.  I have advised the patient to return here for any worsening in his condition.  Patient agrees to the plan and all questions were answered.  The x-rays do not show any significant abnormality at this time.  Final Clinical Impressions(s) / ED Diagnoses   Final diagnoses:  None    ED Discharge Orders    None       Charlestine Night, PA-C 09/26/17 1018    Terrilee Files, MD 09/27/17 [phone]

## 2017-09-26 NOTE — ED Triage Notes (Addendum)
Pt reports falling backward off 4 ft ladder on Wednesday, c/o mid to lower back pain, pain with bowel movement, pain with lifting.  Pt denies loss of bowel or bladder control. Pt has been using 600 mg ibuprofen with some relief.

## 2017-09-26 NOTE — Discharge Instructions (Addendum)
Your x-rays did not show any significant abnormalities at this time.  Use ice and heat on the area that is sore.  Follow-up with a primary doctor or urgent care as needed.  He can expect to be more sore over the next 7 to 10 days.

## 2017-09-27 ENCOUNTER — Other Ambulatory Visit: Payer: Self-pay

## 2017-09-27 ENCOUNTER — Emergency Department (HOSPITAL_COMMUNITY)
Admission: EM | Admit: 2017-09-27 | Discharge: 2017-09-28 | Disposition: A | Discharge status: Home / Self Care | Payer: Self-pay | Attending: Emergency Medicine | Admitting: Emergency Medicine

## 2017-09-27 ENCOUNTER — Encounter (HOSPITAL_COMMUNITY): Payer: Self-pay

## 2017-09-27 DIAGNOSIS — Z79899 Other long term (current) drug therapy: Secondary | ICD-10-CM | POA: Insufficient documentation

## 2017-09-27 DIAGNOSIS — R45851 Suicidal ideations: Secondary | ICD-10-CM | POA: Insufficient documentation

## 2017-09-27 DIAGNOSIS — F1410 Cocaine abuse, uncomplicated: Secondary | ICD-10-CM | POA: Insufficient documentation

## 2017-09-27 DIAGNOSIS — F250 Schizoaffective disorder, bipolar type: Secondary | ICD-10-CM | POA: Insufficient documentation

## 2017-09-27 DIAGNOSIS — F329 Major depressive disorder, single episode, unspecified: Secondary | ICD-10-CM | POA: Insufficient documentation

## 2017-09-27 DIAGNOSIS — Z87891 Personal history of nicotine dependence: Secondary | ICD-10-CM | POA: Insufficient documentation

## 2017-09-27 LAB — COMPREHENSIVE METABOLIC PANEL
ALT: 175 U/L — AB (ref 17–63)
AST: 74 U/L — ABNORMAL HIGH (ref 15–41)
Albumin: 4.4 g/dL (ref 3.5–5.0)
Alkaline Phosphatase: 130 U/L — ABNORMAL HIGH (ref 38–126)
Anion gap: 14 (ref 5–15)
BILIRUBIN TOTAL: 1.2 mg/dL (ref 0.3–1.2)
BUN: 17 mg/dL (ref 6–20)
CALCIUM: 9.3 mg/dL (ref 8.9–10.3)
CO2: 21 mmol/L — ABNORMAL LOW (ref 22–32)
CREATININE: 1.09 mg/dL (ref 0.61–1.24)
Chloride: 107 mmol/L (ref 101–111)
GFR calc non Af Amer: >60 mL/min (ref >60)
Glucose, Bld: 94 mg/dL (ref 65–99)
Potassium: 3.6 mmol/L (ref 3.5–5.1)
Sodium: 142 mmol/L (ref 135–145)
TOTAL PROTEIN: 7.9 g/dL (ref 6.5–8.1)

## 2017-09-27 LAB — CBC WITH DIFFERENTIAL/PLATELET
BASOS ABS: 0 10*3/uL (ref 0.0–0.1)
Basophils Relative: 0 %
EOS ABS: 0.1 10*3/uL (ref 0.0–0.7)
EOS PCT: 1 %
HCT: 41.4 % (ref 39.0–52.0)
Hemoglobin: 13.8 g/dL (ref 13.0–17.0)
Lymphocytes Relative: 13 %
Lymphs Abs: 2 10*3/uL (ref 0.7–4.0)
MCH: 28.5 pg (ref 26.0–34.0)
MCHC: 33.3 g/dL (ref 30.0–36.0)
MCV: 85.4 fL (ref 78.0–100.0)
MONO ABS: 1.2 10*3/uL — AB (ref 0.1–1.0)
Monocytes Relative: 8 %
Neutro Abs: 11.9 10*3/uL — ABNORMAL HIGH (ref 1.7–7.7)
Neutrophils Relative %: 78 %
PLATELETS: 343 10*3/uL (ref 150–400)
RBC: 4.85 MIL/uL (ref 4.22–5.81)
RDW: 13.8 % (ref 11.5–15.5)
WBC: 15.2 10*3/uL — AB (ref 4.0–10.5)

## 2017-09-27 LAB — RAPID URINE DRUG SCREEN, HOSP PERFORMED
Amphetamines: NOT DETECTED
BARBITURATES: NOT DETECTED
BENZODIAZEPINES: NOT DETECTED
COCAINE: POSITIVE — AB
OPIATES: NOT DETECTED
Tetrahydrocannabinol: NOT DETECTED

## 2017-09-27 LAB — ETHANOL

## 2017-09-27 LAB — TROPONIN I

## 2017-09-27 NOTE — ED Notes (Signed)
Patient was placed in paper scrubs, blood was drawn, and urine sample given.

## 2017-09-27 NOTE — ED Notes (Signed)
Pt has been wanded by security. 

## 2017-09-27 NOTE — ED Triage Notes (Signed)
Pt tried to overdose all day today on crack cocaine as well as jumping in front of cars. Pt has a history of SI. Patient could not afford anxiety and depression medications and hasnt taken them in 2 weeks. Pt states this has happened before. Pt has smoked $300 worth of crack today he states.

## 2017-09-28 ENCOUNTER — Other Ambulatory Visit: Payer: Self-pay

## 2017-09-28 ENCOUNTER — Inpatient Hospital Stay (HOSPITAL_COMMUNITY)
Admission: AD | Admit: 2017-09-28 | Discharge: 2017-10-04 | DRG: 885 | Disposition: A | Discharge status: Home / Self Care | Payer: No Typology Code available for payment source | Source: Intra-hospital | Attending: Psychiatry | Admitting: Psychiatry

## 2017-09-28 ENCOUNTER — Encounter (HOSPITAL_COMMUNITY): Payer: Self-pay | Admitting: *Deleted

## 2017-09-28 ENCOUNTER — Emergency Department (HOSPITAL_COMMUNITY): Payer: Self-pay

## 2017-09-28 DIAGNOSIS — F333 Major depressive disorder, recurrent, severe with psychotic symptoms: Secondary | ICD-10-CM | POA: Diagnosis present

## 2017-09-28 DIAGNOSIS — Z87891 Personal history of nicotine dependence: Secondary | ICD-10-CM | POA: Diagnosis not present

## 2017-09-28 DIAGNOSIS — F1994 Other psychoactive substance use, unspecified with psychoactive substance-induced mood disorder: Secondary | ICD-10-CM | POA: Diagnosis not present

## 2017-09-28 DIAGNOSIS — F1490 Cocaine use, unspecified, uncomplicated: Secondary | ICD-10-CM | POA: Diagnosis not present

## 2017-09-28 DIAGNOSIS — Z9114 Patient's other noncompliance with medication regimen: Secondary | ICD-10-CM | POA: Diagnosis not present

## 2017-09-28 DIAGNOSIS — R45851 Suicidal ideations: Secondary | ICD-10-CM | POA: Diagnosis present

## 2017-09-28 DIAGNOSIS — T405X2A Poisoning by cocaine, intentional self-harm, initial encounter: Secondary | ICD-10-CM | POA: Diagnosis present

## 2017-09-28 DIAGNOSIS — F1420 Cocaine dependence, uncomplicated: Secondary | ICD-10-CM | POA: Diagnosis not present

## 2017-09-28 DIAGNOSIS — F1410 Cocaine abuse, uncomplicated: Secondary | ICD-10-CM | POA: Diagnosis present

## 2017-09-28 DIAGNOSIS — F419 Anxiety disorder, unspecified: Secondary | ICD-10-CM | POA: Diagnosis not present

## 2017-09-28 DIAGNOSIS — F1290 Cannabis use, unspecified, uncomplicated: Secondary | ICD-10-CM | POA: Diagnosis not present

## 2017-09-28 DIAGNOSIS — F14159 Cocaine abuse with cocaine-induced psychotic disorder, unspecified: Secondary | ICD-10-CM | POA: Diagnosis present

## 2017-09-28 DIAGNOSIS — G4700 Insomnia, unspecified: Secondary | ICD-10-CM | POA: Diagnosis present

## 2017-09-28 DIAGNOSIS — R451 Restlessness and agitation: Secondary | ICD-10-CM | POA: Diagnosis not present

## 2017-09-28 DIAGNOSIS — Z915 Personal history of self-harm: Secondary | ICD-10-CM | POA: Diagnosis not present

## 2017-09-28 LAB — TROPONIN I

## 2017-09-28 MED ORDER — TRAZODONE HCL 50 MG PO TABS
50.0000 mg | ORAL_TABLET | Freq: Every day | ORAL | Status: DC
  Administered 2017-09-28: 50 mg via ORAL
  Filled 2017-09-28: qty 1

## 2017-09-28 MED ORDER — MAGNESIUM HYDROXIDE 400 MG/5ML PO SUSP: 30.0000 mL | Freq: Every day | ORAL | Status: DC | PRN

## 2017-09-28 MED ORDER — HYDROXYZINE HCL 25 MG PO TABS
25.0000 mg | ORAL_TABLET | Freq: Three times a day (TID) | ORAL | Status: DC | PRN
  Administered 2017-09-28 (×2): 25 mg via ORAL
  Filled 2017-09-28 (×3): qty 1

## 2017-09-28 MED ORDER — CITALOPRAM HYDROBROMIDE 20 MG PO TABS
20.0000 mg | ORAL_TABLET | Freq: Every day | ORAL | Status: DC
  Administered 2017-09-28: 20 mg via ORAL
  Filled 2017-09-28 (×2): qty 1

## 2017-09-28 MED ORDER — HYDROXYZINE HCL 25 MG PO TABS
25.0000 mg | ORAL_TABLET | Freq: Four times a day (QID) | ORAL | Status: DC | PRN
  Administered 2017-09-29 – 2017-10-01 (×2): 25 mg via ORAL
  Filled 2017-09-28 (×2): qty 1

## 2017-09-28 MED ORDER — QUETIAPINE FUMARATE 100 MG PO TABS
100.0000 mg | ORAL_TABLET | Freq: Every evening | ORAL | Status: DC | PRN
  Filled 2017-09-28 (×5): qty 1

## 2017-09-28 MED ORDER — BUSPIRONE HCL 5 MG PO TABS
7.5000 mg | ORAL_TABLET | Freq: Two times a day (BID) | ORAL | Status: DC
  Administered 2017-09-28 (×2): 7.5 mg via ORAL
  Filled 2017-09-28 (×4): qty 1.5
  Filled 2017-09-28: qty 2

## 2017-09-28 MED ORDER — AMITRIPTYLINE HCL 50 MG PO TABS
50.0000 mg | ORAL_TABLET | Freq: Every day | ORAL | Status: DC
  Filled 2017-09-28 (×3): qty 1

## 2017-09-28 MED ORDER — OLANZAPINE 5 MG PO TABS
5.0000 mg | ORAL_TABLET | Freq: Two times a day (BID) | ORAL | Status: DC
  Administered 2017-09-28 (×2): 5 mg via ORAL
  Filled 2017-09-28 (×2): qty 1

## 2017-09-28 MED ORDER — ARIPIPRAZOLE 5 MG PO TABS
5.0000 mg | ORAL_TABLET | Freq: Every day | ORAL | Status: DC
  Administered 2017-09-29: 5 mg via ORAL
  Filled 2017-09-28 (×3): qty 1

## 2017-09-28 MED ORDER — IBUPROFEN 800 MG PO TABS
800.0000 mg | ORAL_TABLET | Freq: Four times a day (QID) | ORAL | Status: DC | PRN
  Administered 2017-09-28 (×2): 800 mg via ORAL
  Filled 2017-09-28 (×2): qty 1

## 2017-09-28 MED ORDER — ALUM & MAG HYDROXIDE-SIMETH 200-200-20 MG/5ML PO SUSP: 30.0000 mL | ORAL | Status: DC | PRN

## 2017-09-28 MED ORDER — GABAPENTIN 300 MG PO CAPS
300.0000 mg | ORAL_CAPSULE | Freq: Two times a day (BID) | ORAL | Status: DC
  Administered 2017-09-29: 300 mg via ORAL
  Filled 2017-09-28 (×6): qty 1
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
## 2017-09-28 NOTE — Progress Notes (Signed)
Pt. meets criteria for inpatient treatment per XXX, NP.  Referred out to the following hospitals:  [facility]     [facility]  [facility]  [facility]  [facility]  [facility]       Disposition CSW will continue to follow for placement.  Timmothy Euler. Kaylyn Lim, MSW, LCSWA Disposition Clinical Social Work [phone] (cell) [phone] (office)

## 2017-09-28 NOTE — Tx Team (Signed)
Initial Treatment Plan 09/28/2017 10:33 PM [name]. ZOX:[id number]    PATIENT STRESSORS: Financial difficulties Medication change or noncompliance Substance abuse   PATIENT STRENGTHS: Ability for insight Average or above average intelligence Capable of independent living General fund of knowledge   PATIENT IDENTIFIED PROBLEMS: Depression Suicidal thoughts Substance Abuse Auditory hallucinations "Get back on my medicine and get rid of these crazy thoughts"                     DISCHARGE CRITERIA:  Ability to meet basic life and health needs Improved stabilization in mood, thinking, and/or behavior Reduction of life-threatening or endangering symptoms to within safe limits Verbal commitment to aftercare and medication compliance  PRELIMINARY DISCHARGE PLAN: Attend aftercare/continuing care group  PATIENT/FAMILY INVOLVEMENT: This treatment plan has been presented to and reviewed with the patient, Logan [name], and/or family member, .  The patient and family have been given the opportunity to ask questions and make suggestions.  Logan Hudson, [place], [place] 09/28/2017, 10:33 PM

## 2017-09-28 NOTE — ED Notes (Signed)
Report to Toby with [facility]

## 2017-09-28 NOTE — ED Notes (Signed)
Patient is resting comfortably. 

## 2017-09-28 NOTE — ED Notes (Signed)
PT resting comfortably with sitter at bedside

## 2017-09-28 NOTE — Progress Notes (Signed)
Pt accepted to [facility], room #500-2. Donell Sievert, PA is the accepting provider.   Dr. Altamese [place] is the attending provider.   Call report to [phone]   Beth@ [facility] notified.    Pt is voluntary and can be transported by [facility].   Pt may arrive to [facility] as soon as transportation is arranged.   Wells Guiles, LCSW, LCAS Disposition CSW [facility]/TTS [phone]

## 2017-09-28 NOTE — ED Notes (Signed)
Ph call from [facility], pt has been accepted to [facility] and can be transported at any time by [employer], call report to [phone], room 500/2, attending: Donell Sievert, PA and accepting Dr. Altamese [place], pt signed voluntary admission and consent for treatment form at 2037 and faxed to [facility]

## 2017-09-28 NOTE — ED Notes (Signed)
Patient is asleep and resting comfortably with sitter at the bedside

## 2017-09-28 NOTE — ED Notes (Signed)
Patient is resting comfortably with sitter at bedside.  
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
## 2017-09-28 NOTE — ED Notes (Signed)
Patient is resting quietly with sitter at bedside

## 2017-09-28 NOTE — Progress Notes (Signed)
Logan Hudson is a 52 year old male pt admitted on voluntary basis. He reports depression with suicidal thoughts and also reports auditory hallucinations that tell him to kill himself. He reports that he was recently hospitalized and when he was discharged he did not go for any follow up treatment and did not take his medications as prescribed. He reports that he has been abusing crack on a daily basis and alcohol on a less frequent basis. He reports he has no support system and was living with a friend in [place]. He reports that he would like to go to a long term treatment facility upon discharge and reports that he needs longer than 30 days. Logan Hudson was oriented to the unit and safety maintained.

## 2017-09-28 NOTE — ED Notes (Signed)
Doctor came in to see patient. Gave him 2 sandwiches and drink ok per Dr. Manus Gunning

## 2017-09-29 DIAGNOSIS — G4700 Insomnia, unspecified: Secondary | ICD-10-CM

## 2017-09-29 DIAGNOSIS — F419 Anxiety disorder, unspecified: Secondary | ICD-10-CM

## 2017-09-29 DIAGNOSIS — F1994 Other psychoactive substance use, unspecified with psychoactive substance-induced mood disorder: Secondary | ICD-10-CM

## 2017-09-29 DIAGNOSIS — F333 Major depressive disorder, recurrent, severe with psychotic symptoms: Principal | ICD-10-CM

## 2017-09-29 DIAGNOSIS — R45851 Suicidal ideations: Secondary | ICD-10-CM

## 2017-09-29 DIAGNOSIS — F1420 Cocaine dependence, uncomplicated: Secondary | ICD-10-CM

## 2017-09-29 MED ORDER — OLANZAPINE 10 MG PO TBDP: 10.0000 mg | ORAL_TABLET | Freq: Three times a day (TID) | ORAL | Status: DC | PRN

## 2017-09-29 MED ORDER — BUSPIRONE HCL 15 MG PO TABS
7.5000 mg | ORAL_TABLET | Freq: Two times a day (BID) | ORAL | Status: DC
  Administered 2017-09-29 – 2017-10-04 (×10): 7.5 mg via ORAL
  Filled 2017-09-29 (×14): qty 1

## 2017-09-29 MED ORDER — IBUPROFEN 800 MG PO TABS
800.0000 mg | ORAL_TABLET | Freq: Three times a day (TID) | ORAL | Status: DC | PRN
  Administered 2017-09-29 – 2017-10-04 (×8): 800 mg via ORAL
  Filled 2017-09-29 (×8): qty 1

## 2017-09-29 MED ORDER — QUETIAPINE FUMARATE 50 MG PO TABS
50.0000 mg | ORAL_TABLET | ORAL | Status: DC
  Administered 2017-09-30: 50 mg via ORAL
  Filled 2017-09-29 (×3): qty 1

## 2017-09-29 MED ORDER — CITALOPRAM HYDROBROMIDE 20 MG PO TABS
20.0000 mg | ORAL_TABLET | Freq: Every day | ORAL | Status: DC
  Administered 2017-09-29 – 2017-10-04 (×6): 20 mg via ORAL
  Filled 2017-09-29 (×3): qty 1
  Filled 2017-09-29: qty 7
  Filled 2017-09-29 (×4): qty 1

## 2017-09-29 MED ORDER — ZIPRASIDONE MESYLATE 20 MG IM SOLR: 20.0000 mg | INTRAMUSCULAR | Status: DC | PRN

## 2017-09-29 MED ORDER — QUETIAPINE FUMARATE 100 MG PO TABS
100.0000 mg | ORAL_TABLET | Freq: Every day | ORAL | Status: DC
  Administered 2017-09-29: 100 mg via ORAL
  Filled 2017-09-29 (×3): qty 1

## 2017-09-29 MED ORDER — TRAZODONE HCL 50 MG PO TABS
50.0000 mg | ORAL_TABLET | Freq: Every evening | ORAL | Status: DC | PRN
  Administered 2017-09-29: 50 mg via ORAL
  Filled 2017-09-29: qty 1

## 2017-09-29 MED ORDER — LORAZEPAM 1 MG PO TABS: 1.0000 mg | ORAL_TABLET | ORAL | Status: DC | PRN

## 2017-09-29 NOTE — Progress Notes (Signed)
Recreation Therapy Notes  INPATIENT RECREATION THERAPY ASSESSMENT  Patient Details Name: Logan [name] MRN: [id number] DOB: Jul 28, 1965 Today's Date: 09/29/2017       Information Obtained From: Patient  Able to Participate in Assessment/Interview: Yes  Patient Presentation: Alert, Oriented  Reason for Admission (Per Patient): Substance Abuse, Suicide Attempt  Patient Stressors: Other (Comment)(Depression, hopeless)  Coping Skills:   Isolation, Self-Injury, TV, Music, Exercise, Substance Abuse, Impulsivity, Talk, Prayer, Avoidance, Read, Hot Bath/Shower  Leisure Interests (2+):  Games - Video games, Garment/textile technologist - Other (Comment)(Bowling alley, festivals)  Frequency of Recreation/Participation: Other (Comment)(video games-daily; bowling-monthly)  Awareness of Community Resources:  Yes  Community Resources:  [place]  Current Use: Yes  If no, Barriers?:    Expressed Interest in [employer] Information: No  County of Residence:  [place]  Patient Main Form of Transportation: Walk  Patient Strengths:  Help others; take people to dinner  Patient Identified Areas of Improvement:  Stop using drugs; exercise more  Patient Goal for Hospitalization:  "Stay positive, focus and get better"  Current SI (including self-harm):  No  Current HI:  No  Current AVH: No  Staff Intervention Plan: Group Attendance, Collaborate with Interdisciplinary Treatment Team  Consent to Intern Participation: N/A     Caroll Rancher, LRT/CTRS  Caroll Rancher A 09/29/2017, 2:36 PM

## 2017-09-29 NOTE — Plan of Care (Signed)
  Problem: Safety: Goal: Periods of time without injury will increase Outcome: Progressing   Problem: Medication: Goal: Compliance with prescribed medication regimen will improve Outcome: Progressing   DAR NOTE: Patient presents with anxious affect and depressed mood.  Pt complained of swollen feet, pt stated he walked from [place] to [place]. Pt stated he was high and did not realize what he was doing. Pt reports good nigh sleep, fair appetite, normal energy, and poor concentration. Endorses passive SI, verbally contracted for safety. Denies auditory and visual hallucinations.  Rates depression at 8, hopelessness at 8, and anxiety at 8.  Maintained on routine safety checks.  Medications given as prescribed.  Support and encouragement offered as needed. States goal for today is to "stay positive."  Patient observed socializing with peers in the dayroom. Will continue to monitor.

## 2017-09-29 NOTE — [facility] Group Notes (Signed)
LCSW Group Therapy Note  09/29/2017 1:15pm  Type of Therapy/Topic:  Group Therapy:  Balance in Life  Participation Level:  Active  Description of Group:    This group will address the concept of balance and how it feels and looks when one is unbalanced. Patients will be encouraged to process areas in their lives that are out of balance and identify reasons for remaining unbalanced. Facilitators will guide patients in utilizing problem-solving interventions to address and correct the stressor making their life unbalanced. Understanding and applying boundaries will be explored and addressed for obtaining and maintaining a balanced life. Patients will be encouraged to explore ways to assertively make their unbalanced needs known to significant others in their lives, using other group members and facilitator for support and feedback.  Therapeutic Goals: 1. Patient will identify two or more emotions or situations they have that consume much of in their lives. 2. Patient will identify signs/triggers that life has become out of balance:  3. Patient will identify two ways to set boundaries in order to achieve balance in their lives:  4. Patient will demonstrate ability to communicate their needs through discussion and/or role plays  Summary of Patient Progress:      Therapeutic Modalities:   Cognitive Behavioral Therapy Solution-Focused Therapy Assertiveness Training  Ida Rogue, [place] 09/29/2017 3:19 PM

## 2017-09-29 NOTE — Progress Notes (Signed)
  DATA ACTION RESPONSE  Objective- Pt. is visible in the dayroom, seen eating a snack.Presents with an animated/anxious affect and mood. Pt attended wrap-up group.Pleasant on the unit.  Subjective- Denies having any SI/HI/AVH at this time. Rates pain 7/10; bilat foot.Is cooperative and remains safe on the unit.  1:1 interaction in private to establish rapport. Encouragement, education, & support given from staff.  PRN Ibuprofen, trazodone, and vsitrail requested and will re-eval accordingly.   Safety maintained with Q 15 checks. Continue with POC.

## 2017-09-29 NOTE — H&P (Signed)
Psychiatric Admission Assessment Adult  Patient Identification: Logan [name] MRN:  [id number] Date of Evaluation:  09/29/2017 Chief Complaint:  Depression, SI, cocaine abuse Principal Diagnosis: Severe recurrent major depressive disorder with psychotic features [facility]) Diagnosis:   Patient Active Problem List   Diagnosis Date Noted  . Substance induced mood disorder ([place]) [F19.94] 09/28/2017  . Severe recurrent major depressive disorder with psychotic features ([place]) [F33.3] 09/15/2017  . Suicidal ideation [R45.851] 03/13/2016  . Cocaine abuse without complication [facility]) [K93.26] 05/23/2011   History of Present Illness:   Osualdo Hansell is a 51 y/o M with history of MDD with psychotic features and cocaine use disorder who was admitted voluntarily from [facility] ED where he presented with worsening depression, SI with plan to walk into traffic, CAH to kill himself, worsening use of cocaine, and medication non-adherence. Pt has recent relevant history of discharge from [facility] inpatient psychiatric unit on 09/20/17 for similar presentation with plan to have outpatient follow up and substance use treatment at [facility]. Pt was medically cleared and transferred to [facility] for additional treatment and stabilization.  Upon initial presentation, pt shares, "I tried to OD on cocaine, and then I tried to jump in front of traffic. I couldn't get my meds. It didn't work out with [employer]. I fell of a ladder when I was there, and they took me to the ED and left me there, so I went to stay with a friend. Then I relapsed." Of note, pt was supplied with 30-day supply of his medications at time of discharge from [facility] on 4/29. Pt reports depressive symptoms of poor sleep with middle insomnia, anhedonia, guilty feelings, poor concentration, psychomotor retardation, and suicidal ideation with plan to walk into traffic. He denies HI. He reports CAH to kill himself for the past few days.  He denies VH. He denies symptoms of bipolar mania, OCD, and PTSD. He reports that he only has been using crack cocaine and he went on a binge of $300 worth of crack cocaine prior to presenting to the ED.   Discussed with patient about treatment options. He reports previously doing well on combination of celexa, buspar, seroquel, and trazodone. He is interested in treatment for his substance use at a long-term treatment program, and he will discuss options with the SW team.  Associated Signs/Symptoms: Depression Symptoms:  depressed mood, anhedonia, insomnia, fatigue, feelings of worthlessness/guilt, difficulty concentrating, hopelessness, suicidal thoughts with specific plan, anxiety, decreased appetite, (Hypo) Manic Symptoms:  Distractibility, Hallucinations, Anxiety Symptoms:  Excessive Worry, Psychotic Symptoms:  Hallucinations: Auditory PTSD Symptoms: NA Total Time spent with patient: 1 hour  Past Psychiatric History:  - previous dx of MDD recurrent, severe with psychosis and cocaine use disorder - Pt reports about 20 previous inpatient stays with last discharge from [facility] on 09/20/17 - Has not followed up with outpatient provider - Pt reports about 6 previous suicide attempts via overdose and cutting self  Is the patient at risk to self? Yes.    Has the patient been a risk to self in the past 6 months? Yes.    Has the patient been a risk to self within the distant past? Yes.    Is the patient a risk to others? Yes.    Has the patient been a risk to others in the past 6 months? Yes.    Has the patient been a risk to others within the distant past? Yes.     Prior Inpatient Therapy:  Prior Outpatient Therapy:    Alcohol Screening: 1. How often do you have a drink containing alcohol?: 4 or more times a week 2. How many drinks containing alcohol do you have on a typical day when you are drinking?: 10 or more 3. How often do you have six or more drinks on one occasion?:  Weekly AUDIT-C Score: 11 4. How often during the last year have you found that you were not able to stop drinking once you had started?: Weekly 5. How often during the last year have you failed to do what was normally expected from you becasue of drinking?: Less than monthly 6. How often during the last year have you needed a first drink in the morning to get yourself going after a heavy drinking session?: Never 7. How often during the last year have you had a feeling of guilt of remorse after drinking?: Weekly 8. How often during the last year have you been unable to remember what happened the night before because you had been drinking?: Never 9. Have you or someone else been injured as a result of your drinking?: No 10. Has a relative or friend or a doctor or another health worker been concerned about your drinking or suggested you cut down?: No Alcohol Use Disorder Identification Test Final Score (AUDIT): 18 Intervention/Follow-up: Alcohol Education Substance Abuse History in the last 12 months:  Yes.   Consequences of Substance Abuse: Medical Consequences:  worsened psychosis and mood symptoms Previous Psychotropic Medications: Yes  Psychological Evaluations: Yes  Past Medical History:  Past Medical History:  Diagnosis Date  . Depression   . MDD (major depressive disorder), recurrent severe, without psychosis ([place]) 03/12/2016  . Schizoaffective disorder, bipolar type ([place]) 06/22/2017   History reviewed. No pertinent surgical history. Family History: History reviewed. No pertinent family history. Family Psychiatric  History: denies family psychiatric history Tobacco Screening: Have you used any form of tobacco in the last 30 days? (Cigarettes, Smokeless Tobacco, Cigars, and/or Pipes): No Social History: Pt is from the [place] area but he has been staying with a friend in [place]. 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 He is  separated from his wife and he has history of 7 marriages. He denies trauma history. He has legal history of multiple charges for panhandling and assault with a vehicle with a total of about 20 years spent incarcerated. Social History   Substance and Sexual Activity  Alcohol Use Yes     Social History   Substance and Sexual Activity  Drug Use Yes  . Types: Cocaine, Marijuana, "Crack" cocaine   Comment: last use 09/27/17    Additional Social History:                           Allergies:   Allergies  Allergen Reactions  . Asa [Aspirin]   . Nicotine   . Paxil [Paroxetine Hcl]   . Tylenol [Acetaminophen]    Lab Results:  Results for orders placed or performed during the hospital encounter of 09/27/17 (from the past 48 hour(s))  Urine rapid drug screen (hosp performed)     Status: Abnormal   Collection Time: 09/27/17 10:05 PM  Result Value Ref Range   Opiates NONE DETECTED NONE DETECTED   Cocaine POSITIVE (A) NONE DETECTED   Benzodiazepines NONE DETECTED NONE DETECTED   Amphetamines NONE DETECTED NONE DETECTED   Tetrahydrocannabinol NONE DETECTED NONE DETECTED  Barbiturates NONE DETECTED NONE DETECTED    Comment: (NOTE) DRUG SCREEN FOR MEDICAL PURPOSES ONLY.  IF CONFIRMATION IS NEEDED FOR ANY PURPOSE, NOTIFY LAB WITHIN 5 DAYS. LOWEST DETECTABLE LIMITS FOR URINE DRUG SCREEN Drug Class                     Cutoff (ng/mL) Amphetamine and metabolites    1000 Barbiturate and metabolites    200 Benzodiazepine                 401 Tricyclics and metabolites     300 Opiates and metabolites        300 Cocaine and metabolites        300 THC                            50 Performed at [facility], [address] [place], [place] [address]   Comprehensive metabolic panel     Status: Abnormal   Collection Time: 09/27/17 10:09 PM  Result Value Ref Range   Sodium 142 135 - 145 mmol/L   Potassium 3.6 3.5 - 5.1 mmol/L   Chloride 107 101 - 111 mmol/L   CO2 21 (L) 22 - 32  mmol/L   Glucose, Bld 94 65 - 99 mg/dL   BUN 17 6 - 20 mg/dL   Creatinine, Ser 1.09 0.61 - 1.24 mg/dL   Calcium 9.3 8.9 - 10.3 mg/dL   Total Protein 7.9 6.5 - 8.1 g/dL   Albumin 4.4 3.5 - 5.0 g/dL   AST 74 (H) 15 - 41 U/L   ALT 175 (H) 17 - 63 U/L   Alkaline Phosphatase 130 (H) 38 - 126 U/L   Total Bilirubin 1.2 0.3 - 1.2 mg/dL   GFR calc non Af Amer >60 >60 mL/min   GFR calc Af Amer >60 >60 mL/min    Comment: (NOTE) The eGFR has been calculated using the CKD EPI equation. This calculation has not been validated in all clinical situations. eGFR's persistently <60 mL/min signify possible Chronic Kidney Disease.    Anion gap 14 5 - 15    Comment: Performed at [facility], [address] [place], [place] [address]  CBC with Differential     Status: Abnormal   Collection Time: 09/27/17 10:09 PM  Result Value Ref Range   WBC 15.2 (H) 4.0 - 10.5 K/uL   RBC 4.85 4.22 - 5.81 MIL/uL   Hemoglobin 13.8 13.0 - 17.0 g/dL   HCT 41.4 39.0 - 52.0 %   MCV 85.4 78.0 - 100.0 fL   MCH 28.5 26.0 - 34.0 pg   MCHC 33.3 30.0 - 36.0 g/dL   RDW 13.8 11.5 - 15.5 %   Platelets 343 150 - 400 K/uL   Neutrophils Relative % 78 %   Neutro Abs 11.9 (H) 1.7 - 7.7 K/uL   Lymphocytes Relative 13 %   Lymphs Abs 2.0 0.7 - 4.0 K/uL   Monocytes Relative 8 %   Monocytes Absolute 1.2 (H) 0.1 - 1.0 K/uL   Eosinophils Relative 1 %   Eosinophils Absolute 0.1 0.0 - 0.7 K/uL   Basophils Relative 0 %   Basophils Absolute 0.0 0.0 - 0.1 K/uL    Comment: Performed at [facility], [address] [place], [place] [address]  Ethanol     Status: None   Collection Time: 09/27/17 10:10 PM  Result Value Ref Range   Alcohol, Ethyl (B) <10 <10 mg/dL  Comment:        LOWEST DETECTABLE LIMIT FOR SERUM ALCOHOL IS 10 mg/dL FOR MEDICAL PURPOSES ONLY Performed at [facility], [address], [place], [place] [address]   Troponin I     Status: None   Collection Time: 09/27/17 10:50 PM  Result Value Ref Range   Troponin I  <0.03 <0.03 ng/mL    Comment: Performed at [facility], [address], [place], [place] [address]  Troponin I     Status: None   Collection Time: 09/28/17  8:19 AM  Result Value Ref Range   Troponin I <0.03 <0.03 ng/mL    Comment: Performed at [facility], [address] [place], [place] [address]    Blood Alcohol level:  Lab Results  Component Value Date   [facility] <10 09/27/2017   ETH <5 11/57/2620    Metabolic Disorder Labs:  Lab Results  Component Value Date   HGBA1C 5.3 06/23/2017   MPG 105.41 06/23/2017   No results found for: PROLACTIN Lab Results  Component Value Date   CHOL 151 06/23/2017   TRIG 96 06/23/2017   HDL 61 06/23/2017   CHOLHDL 2.5 06/23/2017   VLDL 19 06/23/2017   LDLCALC 71 06/23/2017    Current Medications: Current Facility-Administered Medications  Medication Dose Route Frequency Provider Last Rate Last Dose  . alum & mag hydroxide-simeth (MAALOX/MYLANTA) 200-200-20 MG/5ML suspension 30 mL  30 mL Oral Q4H PRN Patriciaann Clan E, PA-C      . busPIRone (BUSPAR) tablet 7.5 mg  7.5 mg Oral BID Pennelope Bracken, MD      . citalopram (CELEXA) tablet 20 mg  20 mg Oral Daily Pennelope Bracken, MD      . hydrOXYzine (ATARAX/VISTARIL) tablet 25 mg  25 mg Oral Q6H PRN Laverle Hobby, PA-C      . ibuprofen (ADVIL,MOTRIN) tablet 800 mg  800 mg Oral Q8H PRN Patriciaann Clan E, PA-C   800 mg at 09/29/17 3559  . OLANZapine zydis (ZYPREXA) disintegrating tablet 10 mg  10 mg Oral Q8H PRN Pennelope Bracken, MD       And  . LORazepam (ATIVAN) tablet 1 mg  1 mg Oral PRN Pennelope Bracken, MD       And  . ziprasidone (GEODON) injection 20 mg  20 mg Intramuscular PRN Pennelope Bracken, MD      . magnesium hydroxide (MILK OF MAGNESIA) suspension 30 mL  30 mL Oral Daily PRN Laverle Hobby, PA-C      . [START ON 09/30/2017] QUEtiapine (SEROQUEL) tablet 50 mg  50 mg Oral BH-q7a Pennelope Bracken, MD       And  . QUEtiapine (SEROQUEL)  tablet 100 mg  100 mg Oral QHS Pennelope Bracken, MD      . traZODone (DESYREL) tablet 50 mg  50 mg Oral QHS PRN,MR X 1 Courtnay Petrilla, Randa Ngo, MD       PTA Medications: Medications Prior to Admission  Medication Sig Dispense Refill Last Dose  . busPIRone (BUSPAR) 7.5 MG tablet Take 1 tablet (7.5 mg total) by mouth 2 (two) times daily. 60 tablet 0 Past Month at Unknown time  . citalopram (CELEXA) 20 MG tablet Take 1 tablet (20 mg total) by mouth daily. 30 tablet 0 Past Month at Unknown time  . ibuprofen (ADVIL,MOTRIN) 800 MG tablet Take 1 tablet (800 mg total) by mouth every 8 (eight) hours as needed. (Patient taking differently: Take 800 mg by mouth every 8 (eight)  hours as needed for mild pain or moderate pain. ) 21 tablet 0 Past Month at Unknown time  . QUEtiapine (SEROQUEL) 100 MG tablet Take 100 mg by mouth daily.   Past Month at Unknown time  . QUEtiapine (SEROQUEL) 200 MG tablet Take 200 mg by mouth at bedtime.   Past Month at Unknown time    Musculoskeletal: Strength & Muscle Tone: within normal limits Gait & Station: normal Patient leans: N/A  Psychiatric Specialty Exam: Physical Exam  Nursing note and vitals reviewed.   Review of Systems  Constitutional: Negative for chills and fever.  Respiratory: Negative for cough and shortness of breath.   Cardiovascular: Negative for chest pain.  Gastrointestinal: Negative for abdominal pain, heartburn, nausea and vomiting.  Psychiatric/Behavioral: Positive for depression, hallucinations, substance abuse and suicidal ideas. The patient is nervous/anxious and has insomnia.     Blood pressure 99/78, pulse 84, temperature (!) 97.5 F (36.4 C), temperature source Oral, resp. rate 20, height 6' (1.829 m), weight 104.3 kg (230 lb).Body mass index is 31.19 kg/m.  General Appearance: Casual and Disheveled  Eye Contact:  Fair  Speech:  Clear and Coherent and Normal Rate  Volume:  Normal  Mood:  Anxious and Depressed  Affect:   Appropriate, Congruent and Constricted  Thought Process:  Coherent and Goal Directed  Orientation:  Full (Time, Place, and Person)  Thought Content:  Hallucinations: Auditory  Suicidal Thoughts:  Yes.  with intent/plan  Homicidal Thoughts:  No  Memory:  Immediate;   Fair Recent;   Fair Remote;   Fair  Judgement:  Poor  Insight:  Lacking  Psychomotor Activity:  Normal  Concentration:  Concentration: Fair  Recall:  [employer] of Knowledge:  Fair  Language:  Fair  Akathisia:  No  Handed:    AIMS (if indicated):     Assets:  Communication Skills Resilience Social Support  ADL's:  Intact  Cognition:  WNL  Sleep:  Number of Hours: 5.75    Treatment Plan Summary: Daily contact with patient to assess and evaluate symptoms and progress in treatment and Medication management  Observation Level/Precautions:  15 minute checks  Laboratory:  CBC Chemistry Profile HbAIC UDS UA  Psychotherapy:  Encourage participation in groups and therapeutic milieu   Medications:  Start seroquel 81m po qAM + 1050mpo qhs. Start celexa 2050mo qDay. Start buspar 7.5mg24m BID. Start vistaril 25mg23mq6h prn anxiety. Start trazodone 50mg 12mhs prn insomnia.   Consultations:    Discharge Concerns:    Estimated LOS: 5-7 days  Other:     Physician Treatment Plan for Primary Diagnosis: Severe recurrent major depressive disorder with psychotic features (HCC) [place] Term Goal(s): Improvement in symptoms so as ready for discharge  Short Term Goals: Ability to identify and develop effective coping behaviors will improve  Physician Treatment Plan for Secondary Diagnosis: Principal Problem:   Severe recurrent major depressive disorder with psychotic features (HCC) [place] Problems:   Cocaine abuse without complication (HCC)  [place] Term Goal(s): Improvement in symptoms so as ready for discharge  Short Term Goals: Ability to identify triggers associated with substance abuse/mental health issues will  improve  I certify that inpatient services furnished can reasonably be expected to improve the patient's condition.    ChristPennelope Bracken/8/20193:09 PM

## 2017-09-29 NOTE — [facility] Suicide Risk Assessment (Signed)
[facility] INPATIENT:  Family/Significant Other Suicide Prevention Education  Suicide Prevention Education:  Patient Refusal for Family/Significant Other Suicide Prevention Education: The patient Logan [name] has refused to provide written consent for family/significant other to be provided Family/Significant Other Suicide Prevention Education during admission and/or prior to discharge.  Physician notified.  Baldo Daub [facility] 09/29/2017, 11:52 AM

## 2017-09-29 NOTE — Progress Notes (Signed)
Patient stated in group that he was able to attend some of his groups today. No additional details about his day were provided. His goal for tomorrow is to attend more groups than he did today.

## 2017-09-29 NOTE — [facility] Suicide Risk Assessment (Signed)
Logan Hudson   Nursing information obtained from:    Demographic factors:    Current Mental Status:    Loss Factors:    Historical Factors:    Risk Reduction Factors:     Total Time spent with patient: 1 hour Principal Problem: Severe recurrent major depressive disorder with psychotic features Logan Hudson) Diagnosis:   Patient Active Problem List   Diagnosis Date Noted  . Substance induced mood disorder (HCC) [F19.94] 09/28/2017  . Severe recurrent major depressive disorder with psychotic features (HCC) [F33.3] 09/15/2017  . Suicidal ideation [R45.851] 03/13/2016  . Cocaine abuse without complication [facility]) [F14.10] 05/23/2011   Subjective Data: See H&P for details  Continued Clinical Symptoms:  Alcohol Use Disorder Identification Test Final Score (AUDIT): 18 The "Alcohol Use Disorders Identification Test", Guidelines for Use in Primary Care, Second Edition.  World Science writer Logan Hudson). Score between 0-7:  no or low risk or alcohol related problems. Score between 8-15:  moderate risk of alcohol related problems. Score between 16-19:  high risk of alcohol related problems. Score 20 or above:  warrants further diagnostic evaluation for alcohol dependence and treatment.   CLINICAL FACTORS:   Severe Anxiety and/or Agitation Depression:   Comorbid alcohol abuse/dependence Impulsivity Alcohol/Substance Abuse/Dependencies More than one psychiatric diagnosis Currently Psychotic Unstable or Poor Therapeutic Relationship Previous Psychiatric Diagnoses and Treatments  Psychiatric Specialty Exam: Physical Exam  Nursing note and vitals reviewed.   ROS-See H&P for details  Blood pressure 99/78, pulse 84, temperature (!) 97.5 F (36.4 C), temperature source Oral, resp. rate 20, height 6' (1.829 m), weight 104.3 kg (230 lb).Body mass index is 31.19 kg/m.   COGNITIVE FEATURES THAT CONTRIBUTE TO RISK:  None    SUICIDE RISK:   Severe:  Frequent, intense, and  enduring suicidal ideation, specific plan, no subjective intent, but some objective markers of intent (i.e., choice of lethal method), the method is accessible, some limited preparatory behavior, evidence of impaired self-control, severe dysphoria/symptomatology, multiple risk factors present, and few if any protective factors, particularly a lack of social support.  PLAN OF CARE: See H&P for details  I certify that inpatient services furnished can reasonably be expected to improve the patient's condition.   Logan Likens, MD 09/29/2017, 3:25 PM

## 2017-09-29 NOTE — Progress Notes (Signed)
Recreation Therapy Notes  Date: 5.8.19 Time: 1000 Location: 500 Hall Dayroom  Group Topic: Self-Esteem  Goal Area(s) Addresses:  Patient will successfully identify positive attributes about themselves.  Patient will successfully identify benefit of improved self-esteem.   Intervention: Markers, blank crest  Activity: [place].  Patients were to identify things that mean something to them.  Patients could highlight their biggest accomplishments, proudest moment, best feature, something they are good at or something they value.  Education:  Self-Esteem, Building control surveyor.   Education Outcome: Acknowledges education/In group clarification offered/Needs additional education  Clinical Observations/Feedback: Pt did not attend group.    Caroll Rancher, LRT/CTRS        Caroll Rancher A 09/29/2017 11:32 AM
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
## 2017-09-30 MED ORDER — QUETIAPINE FUMARATE 50 MG PO TABS
50.0000 mg | ORAL_TABLET | ORAL | Status: DC
  Administered 2017-10-01: 50 mg via ORAL
  Filled 2017-09-30 (×2): qty 1

## 2017-09-30 MED ORDER — QUETIAPINE FUMARATE 50 MG PO TABS
150.0000 mg | ORAL_TABLET | Freq: Every day | ORAL | Status: DC
  Administered 2017-09-30: 150 mg via ORAL
  Filled 2017-09-30 (×2): qty 1

## 2017-09-30 NOTE — Progress Notes (Signed)
Recreation Therapy Notes  Date: 5.9.19 Time: 1000 Location: 500 Hall Dayroom  Group Topic: Communication, Team Building, Problem Solving  Goal Area(s) Addresses:  Patient will effectively work with peer towards shared goal.  Patient will identify skill used to make activity successful.  Patient will identify how skills used during activity can be used to reach post d/c goals.   Intervention: STEM Activity   Activity: Straw Bridge. In groups of 2, groups were to build a standing bridge that could hold the weight of a 60 piece puzzle box.  Each group was given 20 straws and a long piece of masking tape.  Education: Pharmacist, community, Building control surveyor.   Education Outcome: Acknowledges education/In group clarification offered/Needs additional education.   Clinical Observations/Feedback: Pt did not attend group.    Caroll Rancher, LRT/CTRS         Caroll Rancher A 09/30/2017 12:51 PM

## 2017-09-30 NOTE — Tx Team (Signed)
Interdisciplinary Treatment and Diagnostic Plan Update  09/30/2017 Time of Session: 4:04 PM  [name] [facility] MRN: [id number]  Principal Diagnosis: Severe recurrent major depressive disorder with psychotic features (HCC)  Secondary Diagnoses: Principal Problem:   Severe recurrent major depressive disorder with psychotic features (HCC) Active Problems:   Cocaine abuse without complication (HCC)   Current Medications:  Current Facility-Administered Medications  Medication Dose Route Frequency Provider Last Rate Last Dose  . alum & mag hydroxide-simeth (MAALOX/MYLANTA) 200-200-20 MG/5ML suspension 30 mL  30 mL Oral Q4H PRN Donell Sievert E, PA-C      . busPIRone (BUSPAR) tablet 7.5 mg  7.5 mg Oral BID Micheal Likens, MD   7.5 mg at 09/30/17 0829  . citalopram (CELEXA) tablet 20 mg  20 mg Oral Daily Micheal Likens, MD   20 mg at 09/30/17 0829  . hydrOXYzine (ATARAX/VISTARIL) tablet 25 mg  25 mg Oral Q6H PRN Kerry Hough, PA-C   25 mg at 09/29/17 2102  . ibuprofen (ADVIL,MOTRIN) tablet 800 mg  800 mg Oral Q8H PRN Kerry Hough, PA-C   800 mg at 09/29/17 2102  . OLANZapine zydis (ZYPREXA) disintegrating tablet 10 mg  10 mg Oral Q8H PRN Micheal Likens, MD       And  . LORazepam (ATIVAN) tablet 1 mg  1 mg Oral PRN Micheal Likens, MD       And  . ziprasidone (GEODON) injection 20 mg  20 mg Intramuscular PRN Micheal Likens, MD      . magnesium hydroxide (MILK OF MAGNESIA) suspension 30 mL  30 mL Oral Daily PRN Donell Sievert E, PA-C      . QUEtiapine (SEROQUEL) tablet 150 mg  150 mg Oral QHS Micheal Likens, MD       And  . Melene Muller ON 10/01/2017] QUEtiapine (SEROQUEL) tablet 50 mg  50 mg Oral Veatrice Kells, MD      . traZODone (DESYREL) tablet 50 mg  50 mg Oral QHS PRN,MR X 1 Micheal Likens, MD   50 mg at 09/29/17 2102    PTA Medications: Medications Prior to Admission  Medication Sig Dispense  Refill Last Dose  . busPIRone (BUSPAR) 7.5 MG tablet Take 1 tablet (7.5 mg total) by mouth 2 (two) times daily. 60 tablet 0 Past Month at Unknown time  . citalopram (CELEXA) 20 MG tablet Take 1 tablet (20 mg total) by mouth daily. 30 tablet 0 Past Month at Unknown time  . ibuprofen (ADVIL,MOTRIN) 800 MG tablet Take 1 tablet (800 mg total) by mouth every 8 (eight) hours as needed. (Patient taking differently: Take 800 mg by mouth every 8 (eight) hours as needed for mild pain or moderate pain. ) 21 tablet 0 Past Month at Unknown time  . QUEtiapine (SEROQUEL) 100 MG tablet Take 100 mg by mouth daily.   Past Month at Unknown time  . QUEtiapine (SEROQUEL) 200 MG tablet Take 200 mg by mouth at bedtime.   Past Month at Unknown time    Patient Stressors: Financial difficulties Medication change or noncompliance Substance abuse  Patient Strengths: Ability for insight Average or above average intelligence Capable of independent living General fund of knowledge  Treatment Modalities: Medication Management, Group therapy, Case management,  1 to 1 session with clinician, Psychoeducation, Recreational therapy.   Physician Treatment Plan for Primary Diagnosis: Severe recurrent major depressive disorder with psychotic features (HCC) Long Term Goal(s): Improvement in symptoms so as ready for discharge  Short  Term Goals: Ability to identify and develop effective coping behaviors will improve Ability to identify triggers associated with substance abuse/mental health issues will improve  Medication Management: Evaluate patient's response, side effects, and tolerance of medication regimen.  Therapeutic Interventions: 1 to 1 sessions, Unit Group sessions and Medication administration.  Evaluation of Outcomes: Progressing  Physician Treatment Plan for Secondary Diagnosis: Principal Problem:   Severe recurrent major depressive disorder with psychotic features (HCC) Active Problems:   Cocaine abuse  without complication (HCC)   Long Term Goal(s): Improvement in symptoms so as ready for discharge  Short Term Goals: Ability to identify and develop effective coping behaviors will improve Ability to identify triggers associated with substance abuse/mental health issues will improve  Medication Management: Evaluate patient's response, side effects, and tolerance of medication regimen.  Therapeutic Interventions: 1 to 1 sessions, Unit Group sessions and Medication administration.  Evaluation of Outcomes: Progressing   RN Treatment Plan for Primary Diagnosis: Severe recurrent major depressive disorder with psychotic features (HCC) Long Term Goal(s): Knowledge of disease and therapeutic regimen to maintain health will improve  Short Term Goals: Ability to identify and develop effective coping behaviors will improve and Compliance with prescribed medications will improve  Medication Management: RN will administer medications as ordered by provider, will assess and evaluate patient's response and provide education to patient for prescribed medication. RN will report any adverse and/or side effects to prescribing provider.  Therapeutic Interventions: 1 on 1 counseling sessions, Psychoeducation, Medication administration, Evaluate responses to treatment, Monitor vital signs and CBGs as ordered, Perform/monitor CIWA, COWS, AIMS and Fall Risk screenings as ordered, Perform wound care treatments as ordered.  Evaluation of Outcomes: Progressing   LCSW Treatment Plan for Primary Diagnosis: Severe recurrent major depressive disorder with psychotic features (HCC) Long Term Goal(s): Safe transition to appropriate next level of care at discharge, Engage patient in therapeutic group addressing interpersonal concerns.  Short Term Goals: Engage patient in aftercare planning with referrals and resources  Therapeutic Interventions: Assess for all discharge needs, 1 to 1 time with Social worker, Explore  available resources and support systems, Assess for adequacy in community support network, Educate family and significant other(s) on suicide prevention, Complete Psychosocial Assessment, Interpersonal group therapy.  Evaluation of Outcomes: Progressing  Pt states he wants to get into rehab-CSW to work with him on options   Progress in Treatment: Attending groups: Sporadically Participating in groups: Minimally Taking medication as prescribed: Yes Toleration medication: Yes, no side effects reported at this time Family/Significant other contact made: No Patient understands diagnosis: Yes AEB asking for help with addiction Discussing patient identified problems/goals with staff: Yes Medical problems stabilized or resolved: Yes Denies suicidal/homicidal ideation: Yes Issues/concerns per patient self-inventory: None Other: N/A  New problem(s) identified: None identified at this time.   New Short Term/Long Term Goal(s): "Want to go on to rehab from here-long term. And I want to stay on meds."  Discharge Plan or Barriers:   Reason for Continuation of Hospitalization: Depression Hallucinations  Medication stabilization Suicidal ideation   Estimated Length of Stay: 5/13  Attendees: Patient: Logan Hudson 09/30/2017  4:04 PM  Physician: Jolyne Loa, MD 09/30/2017  4:04 PM  Nursing: Maxie Barb RN 09/30/2017  4:04 PM  RN Care Manager: Onnie Boer, RN 09/30/2017  4:04 PM  Social Worker: Richelle Ito 09/30/2017  4:04 PM  Recreational Therapist: Aggie Cosier 09/30/2017  4:04 PM  Other: Tomasita Morrow 09/30/2017  4:04 PM  Other:  09/30/2017  4:04 PM  Scribe for Treatment Team:  Daryel Gerald LCSW 09/30/2017 4:04 PM

## 2017-09-30 NOTE — [facility] Group Notes (Signed)
LCSW Group Therapy Note   09/30/2017 1:15pm   Type of Therapy and Topic:  Group Therapy:  Positive Affirmations   Participation Level:  Did Not Attend  Description of Group: This group addressed positive affirmation toward self and others. Patients went around the room and identified two positive things about themselves and two positive things about a peer in the room. Patients reflected on how it felt to share something positive with others, to identify positive things about themselves, and to hear positive things from others. Patients were encouraged to have a daily reflection of positive characteristics or circumstances.  Therapeutic Goals 1. Patient will verbalize two of their positive qualities 2. Patient will demonstrate empathy for others by stating two positive qualities about a peer in the group 3. Patient will verbalize their feelings when voicing positive self affirmations and when voicing positive affirmations of others 4. Patients will discuss the potential positive impact on their wellness/recovery of focusing on positive traits of self and others. Summary of Patient Progress:    Therapeutic Modalities Cognitive Behavioral Therapy Motivational Interviewing  Ida Rogue, [place] 09/30/2017 4:05 PM

## 2017-09-30 NOTE — Progress Notes (Signed)
Adult Psychoeducational Group Note  Date:  09/30/2017 Time:  8:22 PM  Group Topic/Focus:  Wrap-Up Group:   The focus of this group is to help patients review their daily goal of treatment and discuss progress on daily workbooks.  Participation Level:  Did Not Attend  Participation Quality:  Did not attend  Affect:  Did not attend  Cognitive:  Did not attend  Insight: None  Engagement in Group:  Did not attend  Modes of Intervention:  Did not attend  Additional Comments:  Patient did not attend wrap up group tonight.  Wilma Michaelson L Eular Panek 09/30/2017, 8:22 PM

## 2017-09-30 NOTE — Progress Notes (Signed)
Pt presents with a flat affect and anxious mood. Pt denies SI/HI. Pt denies AVH today. Pt c/o difficulty sleeping last night and stated that the meds were not effective. Pt c/o blisters to his bilateral foot. Pt expressed that his feet burn when he walks. Writer made MD aware during progression meeting. Pt compliant with taking meds and denies any side effects to meds. Medications reviewed with pt. Verbal support provided. Pt encouraged to attend groups. 15 minute checks performed for safety. Pt complaint with tx plan.

## 2017-09-30 NOTE — Progress Notes (Signed)
[facility] MD Progress Note  09/30/2017 3:41 PM Logan [name]  MRN:  [id number] Subjective:    Logan Hudson is a 52 y/o M with history of MDD with psychotic features and cocaine use disorder who was admitted voluntarily from [facility] ED where he presented with worsening depression, SI with plan to walk into traffic, CAH to kill himself, worsening use of cocaine, and medication non-adherence. Pt has recent relevant history of discharge from [facility] inpatient psychiatric unit on 09/20/17 for similar presentation with plan to have outpatient follow up and substance use treatment at [facility]. Pt was medically cleared and transferred to [facility] for additional treatment and stabilization. He was restarted on previous medications of celexa, buspar, and trazodone, and pt requested to be restarted on previously helpful medication of seroquel. He has been reporting incremental improvement of his presenting symptoms.  Today upon evaluation, pt shares, "I'm doing pretty good." His only specific complaint is some foot pain associated with blisters from when he was walking excessively prior to admission. He also notes some soreness of his lower back which has been relieved by ibuprofen. He notes that his mood has been improving. He denies SI/HI/AH/VH. He is tolerating his medications well, and he is in agreement to continue his current regimen. Discussed with patient about option of increasing evening dose of seroquel, and he is in agreement. He had no further questions, comments, or concerns.   Principal Problem: Severe recurrent major depressive disorder with psychotic features (HCC) Diagnosis:   Patient Active Problem List   Diagnosis Date Noted  . Substance induced mood disorder (HCC) [F19.94] 09/28/2017  . Severe recurrent major depressive disorder with psychotic features (HCC) [F33.3] 09/15/2017  . Suicidal ideation [R45.851] 03/13/2016  . Cocaine abuse without complication (HCC)  [F14.10] 05/23/2011   Total Time spent with patient: 30 minutes  Past Psychiatric History: see H&P  Past Medical History:  Past Medical History:  Diagnosis Date  . Depression   . 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 Schizoaffective disorder, bipolar type (HCC) 06/22/2017   History reviewed. No pertinent surgical history. Family History: History reviewed. No pertinent family history. Family Psychiatric  History: see H&P Social History:  Social History   Substance and Sexual Activity  Alcohol Use Yes     Social History   Substance and Sexual Activity  Drug Use Yes  . Types: Cocaine, Marijuana, "Crack" cocaine   Comment: last use 09/27/17    Social History   Socioeconomic History  . Marital status: Widowed    Spouse name: Not on file  . Number of children: Not on file  . Years of education: Not on file  . Highest education level: Not on file  Occupational History  . Not on file  Social Needs  . Financial resource strain: Not on file  . Food insecurity:    Worry: Not on file    Inability: Not on file  . Transportation needs:    Medical: Not on file    Non-medical: Not on file  Tobacco Use  . Smoking status: Former Games developer  . Smokeless tobacco: Never Used  Substance and Sexual Activity  . Alcohol use: Yes  . Drug use: Yes    Types: Cocaine, Marijuana, "Crack" cocaine    Comment: last use 09/27/17  . Sexual activity: Yes  Lifestyle  . Physical activity:    Days per week: Not on file    Minutes per session: Not on file  .  Stress: Not on file  Relationships  . Social connections:    Talks on phone: Not on file    Gets together: Not on file    Attends religious service: Not on file    Active member of club or organization: Not on file    Attends meetings of clubs or organizations: Not on file    Relationship status: Not on file  Other Topics Concern  . Not on file  Social History Narrative  . Not on file    Additional Social History:                         Sleep: Good  Appetite:  Good  Current Medications: Current Facility-Administered Medications  Medication Dose Route Frequency Provider Last Rate Last Dose  . alum & mag hydroxide-simeth (MAALOX/MYLANTA) 200-200-20 MG/5ML suspension 30 mL  30 mL Oral Q4H PRN Donell Sievert E, PA-C      . busPIRone (BUSPAR) tablet 7.5 mg  7.5 mg Oral BID Micheal Likens, MD   7.5 mg at 09/30/17 0829  . citalopram (CELEXA) tablet 20 mg  20 mg Oral Daily Micheal Likens, MD   20 mg at 09/30/17 0829  . hydrOXYzine (ATARAX/VISTARIL) tablet 25 mg  25 mg Oral Q6H PRN Kerry Hough, PA-C   25 mg at 09/29/17 2102  . ibuprofen (ADVIL,MOTRIN) tablet 800 mg  800 mg Oral Q8H PRN Kerry Hough, PA-C   800 mg at 09/29/17 2102  . OLANZapine zydis (ZYPREXA) disintegrating tablet 10 mg  10 mg Oral Q8H PRN Micheal Likens, MD       And  . LORazepam (ATIVAN) tablet 1 mg  1 mg Oral PRN Micheal Likens, MD       And  . ziprasidone (GEODON) injection 20 mg  20 mg Intramuscular PRN Micheal Likens, MD      . magnesium hydroxide (MILK OF MAGNESIA) suspension 30 mL  30 mL Oral Daily PRN Donell Sievert E, PA-C      . QUEtiapine (SEROQUEL) tablet 150 mg  150 mg Oral QHS Micheal Likens, MD       And  . Melene Muller ON 10/01/2017] QUEtiapine (SEROQUEL) tablet 50 mg  50 mg Oral BH-q7a Micheal Likens, MD      . traZODone (DESYREL) tablet 50 mg  50 mg Oral QHS PRN,MR X 1 Micheal Likens, MD   50 mg at 09/29/17 2102    Lab Results: No results found for this or any previous visit (from the past 48 hour(s)).  Blood Alcohol level:  Lab Results  Component Value Date   ETH <10 09/27/2017   ETH <5 03/12/2016    Metabolic Disorder Labs: Lab Results  Component Value Date   HGBA1C 5.3 06/23/2017   MPG 105.41 06/23/2017   No results found for: PROLACTIN Lab Results  Component Value Date   CHOL 151  06/23/2017   TRIG 96 06/23/2017   HDL 61 06/23/2017   CHOLHDL 2.5 06/23/2017   VLDL 19 06/23/2017   LDLCALC 71 06/23/2017    Physical Findings: AIMS: Facial and Oral Movements Muscles of Facial Expression: None, normal Lips and Perioral Area: None, normal Jaw: None, normal Tongue: None, normal,Extremity Movements Upper (arms, wrists, hands, fingers): None, normal Lower (legs, knees, ankles, toes): None, normal, Trunk Movements Neck, shoulders, hips: None, normal, Overall Severity Severity of abnormal movements (highest score from questions above): None, normal Incapacitation due to abnormal movements: None, normal Patient's  awareness of abnormal movements (rate only patient's report): No Awareness, Dental Status Current problems with teeth and/or dentures?: Yes Does patient usually wear dentures?: No  CIWA:    COWS:     Musculoskeletal: Strength & Muscle Tone: within normal limits Gait & Station: normal Patient leans: N/A  Psychiatric Specialty Exam: Physical Exam  Nursing note and vitals reviewed.   Review of Systems  Constitutional: Negative for chills and fever.  Respiratory: Negative for cough and shortness of breath.   Cardiovascular: Negative for chest pain.  Gastrointestinal: Negative for abdominal pain, heartburn, nausea and vomiting.  Musculoskeletal: Positive for back pain.       Foot pain from blisters   Psychiatric/Behavioral: Negative for depression, hallucinations and suicidal ideas. The patient is not nervous/anxious and does not have insomnia.     Blood pressure 118/72, pulse 93, temperature 98 F (36.7 C), temperature source Oral, resp. rate 20, height 6' (1.829 m), weight 104.3 kg (230 lb).Body mass index is 31.19 kg/m.  General Appearance: Casual and Fairly Groomed  Eye Contact:  Good  Speech:  Clear and Coherent and Normal Rate  Volume:  Normal  Mood:  Euthymic  Affect:  Appropriate, Congruent and Constricted  Thought Process:  Coherent and  Goal Directed  Orientation:  Full (Time, Place, and Person)  Thought Content:  Logical  Suicidal Thoughts:  No  Homicidal Thoughts:  No  Memory:  Immediate;   Fair Recent;   Fair Remote;   Fair  Judgement:  Fair  Insight:  Fair  Psychomotor Activity:  Normal  Concentration:  Concentration: Fair  Recall:  [employer] of Knowledge:  Fair  Language:  Fair  Akathisia:  No  Handed:    AIMS (if indicated):     Assets:  Communication Skills Desire for Improvement Physical Health Resilience Social Support  ADL's:  Intact  Cognition:  WNL  Sleep:  Number of Hours: 6.75   Treatment Plan Summary: Daily contact with patient to assess and evaluate symptoms and progress in treatment and Medication management   -Continue inpatient hospitalization  -MDD, recurrent, severe, with psychotic features  - Continue celexa  po qDay   -Change seroquel  QAM+  qhs to seroquel  qAM +150mg  po qhs.  -Anxiety   -Continue buspar 7.5mg  po BID   -Continue vistaril  po q6h prn anxiety  -Insomnia   -Continue trazodone  po qhs prn insomnia (may repeat x1)  -Agitation   -Continue zydis  po q8h prn agitation   -Continue ativan  po once prn agitation    -Continue geodon  IM once prn agitation and refusing oral meds  -Encourage participation in groups and therapeutic milieu  -Disposition planning will be ongoing  Micheal Likens, MD 09/30/2017, 3:41 PM

## 2017-09-30 NOTE — Progress Notes (Signed)
Patient denies SI, Hi and AVH this shift.  Patient has been calm and cooperative, attended groups and engaged in unit activities.  Patient has had no incidents of behavioral dyscontrol.   Assess patient for safety offer medications as prescribed, engaged patient in 1:1 staff talks.   Patient able to contract for safety. Continue to monitor as planned.  

## 2017-10-01 MED ORDER — QUETIAPINE FUMARATE 200 MG PO TABS
200.0000 mg | ORAL_TABLET | Freq: Every day | ORAL | Status: DC
  Administered 2017-10-01 – 2017-10-03 (×3): 200 mg via ORAL
  Filled 2017-10-01: qty 7
  Filled 2017-10-01 (×5): qty 1

## 2017-10-01 MED ORDER — TRAZODONE HCL 50 MG PO TABS
50.0000 mg | ORAL_TABLET | Freq: Every day | ORAL | Status: DC
  Administered 2017-10-01 – 2017-10-03 (×3): 50 mg via ORAL
  Filled 2017-10-01 (×3): qty 1
  Filled 2017-10-01: qty 7
  Filled 2017-10-01 (×2): qty 1

## 2017-10-01 MED ORDER — QUETIAPINE FUMARATE 50 MG PO TABS
50.0000 mg | ORAL_TABLET | ORAL | Status: DC
  Administered 2017-10-02 – 2017-10-04 (×3): 50 mg via ORAL
  Filled 2017-10-01: qty 1
  Filled 2017-10-01: qty 7
  Filled 2017-10-01 (×3): qty 1

## 2017-10-01 NOTE — Progress Notes (Signed)
[facility] MD Progress Note  10/01/2017 10:16 AM Logan [name]  MRN:  [id number] Subjective:    Logan Hudson is a 52 y/o M with history of MDD with psychotic features and cocaine use disorder who was admitted voluntarily from [facility] ED where he presented with worsening depression, SI with plan to walk into traffic, CAH to kill himself, worsening use of cocaine, and medication non-adherence. Pt has recent relevant history of discharge from [facility] inpatient psychiatric unit on 09/20/17 for similar presentation with plan to have outpatient follow up and substance use treatment at [facility]. Pt was medically cleared and transferred to [facility] for additional treatment and stabilization. He was restarted on previous medications of celexa, buspar, and trazodone, and pt requested to be restarted on previously helpful medication of seroquel. Dose of seroquel has been titrated up during his stay. He has been reporting incremental improvement of his presenting symptoms.  Today upon evaluation, pt shares, "I'v been up since 2AM, I didn't ask for a trazodone last night. I'm a little more depressed this morning since I didn't sleep." Discussed with patient about scheduling trazodone at bedtime rather than making it PRN, and he was in agreement. Pt reports that overall his mood is improving. He denies SI/HI/AH/VH, and he notes that seroquel has been helpful for resolving symptoms of AH. He is in agreement to increase dose of seroquel at bedtime as well. Discussed with patient for his plans for after the hospital, and he notes that he plans to return to [place] to stay with a sober friend as he does not feel he could work 40 hours per week at a program such as [employer]. Pt had no further questions, comments, or concerns.  Principal Problem: Severe recurrent major depressive disorder with psychotic features (HCC) Diagnosis:   Patient Active Problem List   Diagnosis Date  Noted  . Substance induced mood disorder (HCC) [F19.94] 09/28/2017  . Severe recurrent major depressive disorder with psychotic features (HCC) [F33.3] 09/15/2017  . Suicidal ideation [R45.851] 03/13/2016  . Cocaine abuse without complication (HCC) [F14.10] 05/23/2011   Total Time spent with patient: 30 minutes  Past Psychiatric History: See H&P  Past Medical History:  Past Medical History:  Diagnosis Date  . Depression   . 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 Schizoaffective disorder, bipolar type (HCC) 06/22/2017   History reviewed. No pertinent surgical history. Family History: History reviewed. No pertinent family history. Family Psychiatric  History: See H&P Social History:  Social History   Substance and Sexual Activity  Alcohol Use Yes     Social History   Substance and Sexual Activity  Drug Use Yes  . Types: Cocaine, Marijuana, "Crack" cocaine   Comment: last use 09/27/17    Social History   Socioeconomic History  . Marital status: Widowed    Spouse name: Not on file  . Number of children: Not on file  . Years of education: Not on file  . Highest education level: Not on file  Occupational History  . Not on file  Social Needs  . Financial resource strain: Not on file  . Food insecurity:    Worry: Not on file    Inability: Not on file  . Transportation needs:    Medical: Not on file    Non-medical: Not on file  Tobacco Use  . Smoking status: Former Games developer  . Smokeless tobacco: Never Used  Substance and Sexual Activity  .  Alcohol use: Yes  . Drug use: Yes    Types: Cocaine, Marijuana, "Crack" cocaine    Comment: last use 09/27/17  . Sexual activity: Yes  Lifestyle  . Physical activity:    Days per week: Not on file    Minutes per session: Not on file  . Stress: Not on file  Relationships  . Social connections:    Talks on phone: Not on file    Gets together: Not on file    Attends religious service:  Not on file    Active member of club or organization: Not on file    Attends meetings of clubs or organizations: Not on file    Relationship status: Not on file  Other Topics Concern  . Not on file  Social History Narrative  . Not on file   Additional Social History:                         Sleep: Poor  Appetite:  Good  Current Medications: Current Facility-Administered Medications  Medication Dose Route Frequency Provider Last Rate Last Dose  . alum & mag hydroxide-simeth (MAALOX/MYLANTA) 200-200-20 MG/5ML suspension 30 mL  30 mL Oral Q4H PRN Donell Sievert E, PA-C      . busPIRone (BUSPAR) tablet 7.5 mg  7.5 mg Oral BID Micheal Likens, MD   7.5 mg at 10/01/17 0721  . citalopram (CELEXA) tablet 20 mg  20 mg Oral Daily Jolyne Loa T, MD   20 mg at 10/01/17 0721  . hydrOXYzine (ATARAX/VISTARIL) tablet 25 mg  25 mg Oral Q6H PRN Kerry Hough, PA-C   25 mg at 09/29/17 2102  . ibuprofen (ADVIL,MOTRIN) tablet 800 mg  800 mg Oral Q8H PRN Kerry Hough, PA-C   800 mg at 09/30/17 1724  . OLANZapine zydis (ZYPREXA) disintegrating tablet 10 mg  10 mg Oral Q8H PRN Micheal Likens, MD       And  . LORazepam (ATIVAN) tablet 1 mg  1 mg Oral PRN Micheal Likens, MD       And  . ziprasidone (GEODON) injection 20 mg  20 mg Intramuscular PRN Micheal Likens, MD      . magnesium hydroxide (MILK OF MAGNESIA) suspension 30 mL  30 mL Oral Daily PRN Donell Sievert E, PA-C      . QUEtiapine (SEROQUEL) tablet 200 mg  200 mg Oral QHS Micheal Likens, MD       And  . Melene Muller ON 10/02/2017] QUEtiapine (SEROQUEL) tablet 50 mg  50 mg Oral BH-q7a Micheal Likens, MD      . traZODone (DESYREL) tablet 50 mg  50 mg Oral QHS Micheal Likens, MD        Lab Results: No results found for this or any previous visit (from the past 48 hour(s)).  Blood Alcohol level:  Lab Results  Component Value Date   ETH <10 09/27/2017   ETH  <5 03/12/2016    Metabolic Disorder Labs: Lab Results  Component Value Date   HGBA1C 5.3 06/23/2017   MPG 105.41 06/23/2017   No results found for: PROLACTIN Lab Results  Component Value Date   CHOL 151 06/23/2017   TRIG 96 06/23/2017   HDL 61 06/23/2017   CHOLHDL 2.5 06/23/2017   VLDL 19 06/23/2017   LDLCALC 71 06/23/2017    Physical Findings: AIMS: Facial and Oral Movements Muscles of Facial Expression: None, normal Lips and Perioral Area: None, normal Jaw:  None, normal Tongue: None, normal,Extremity Movements Upper (arms, wrists, hands, fingers): None, normal Lower (legs, knees, ankles, toes): None, normal, Trunk Movements Neck, shoulders, hips: None, normal, Overall Severity Severity of abnormal movements (highest score from questions above): None, normal Incapacitation due to abnormal movements: None, normal Patient's awareness of abnormal movements (rate only patient's report): No Awareness, Dental Status Current problems with teeth and/or dentures?: Yes Does patient usually wear dentures?: No  CIWA:    COWS:     Musculoskeletal: Strength & Muscle Tone: within normal limits Gait & Station: normal Patient leans: N/A  Psychiatric Specialty Exam: Physical Exam  Nursing note and vitals reviewed.   Review of Systems  Constitutional: Negative for chills and fever.  Respiratory: Negative for cough and shortness of breath.   Cardiovascular: Negative for chest pain.  Gastrointestinal: Negative for abdominal pain, heartburn, nausea and vomiting.  Psychiatric/Behavioral: Positive for depression. Negative for hallucinations and suicidal ideas. The patient has insomnia. The patient is not nervous/anxious.     Blood pressure 111/67, pulse 85, temperature 97.9 F (36.6 C), temperature source Oral, resp. rate 20, height 6' (1.829 m), weight 104.3 kg (230 lb).Body mass index is 31.19 kg/m.  General Appearance: Casual and Fairly Groomed  Eye Contact:  Good  Speech:   Clear and Coherent and Normal Rate  Volume:  Normal  Mood:  Depressed  Affect:  Appropriate, Congruent and Constricted  Thought Process:  Coherent and Goal Directed  Orientation:  Full (Time, Place, and Person)  Thought Content:  Logical  Suicidal Thoughts:  No  Homicidal Thoughts:  No  Memory:  Immediate;   Fair Recent;   Fair Remote;   Fair  Judgement:  Fair  Insight:  Lacking  Psychomotor Activity:  Normal  Concentration:  Concentration: Fair  Recall:  [employer] of Knowledge:  Fair  Language:  Fair  Akathisia:  No  Handed:    AIMS (if indicated):     Assets:  Resilience Social Support  ADL's:  Intact  Cognition:  WNL  Sleep:  Number of Hours: 6.5   Treatment Plan Summary: Daily contact with patient to assess and evaluate symptoms and progress in treatment and Medication management   -Continue inpatient hospitalization  -MDD, recurrent, severe, with psychotic features             - Continue celexa  po qDay             -Change seroquel  QAM+  qhs to seroquel  qAM +200mg  po qhs.  -Anxiety              -Continue buspar 7.5mg  po BID             -Continue vistaril  po q6h prn anxiety  -Insomnia              -Change trazodone  po qhs prn insomnia to trazodone  po qhs (scheduled)  -Agitation              -Continue zydis  po q8h prn agitation              -Continue ativan  po once prn agitation              -Continue geodon  IM once prn agitation and refusing oral meds  -Encourage participation in groups and therapeutic milieu  -Disposition planning will be ongoing  Micheal Likens, MD 10/01/2017, 10:16 AM

## 2017-10-01 NOTE — Progress Notes (Signed)
D: Patient denies SI, HI or AVH this evening. Patient presents as flat and isolative but pleasant and cooperative.  Pt. Did not attend evening wrap up group as he was in his room sleeping.  Pt. Is minimal up interaction, forwarding little and when asked about his day described it as "ok".  Pt. Denied any physical complaints.   A: Patient given emotional support from RN. Patient encouraged to come to staff with concerns and/or questions. Patient's medication routine continued. Patient's orders and plan of care reviewed.   R: Patient remains appropriate and cooperative. Will continue to monitor patient q15 minutes for safety.

## 2017-10-01 NOTE — Plan of Care (Signed)
  Problem: Health Behavior/Discharge Planning: Goal: Ability to make decisions will improve Outcome: Progressing   Problem: Education: Goal: Ability to make informed decisions regarding treatment will improve Outcome: Progressing   Problem: Medication: Goal: Compliance with prescribed medication regimen will improve 10/01/2017 1358 by Sherryl Manges, RN Outcome: Progressing 10/01/2017 1353 by Sherryl Manges, RN Outcome: Progressing   Problem: Nutritional: Goal: Ability to achieve adequate nutritional intake will improve Outcome: Progressing   Problem: Health Behavior/Discharge Planning: Goal: Ability to manage health-related needs will improve Outcome: Progressing D: Pt presents with flat affect and depressed mood on interactions. Visible in bed on initial approach. Denies SI, HI, AVH and pain "no, not right now". C/O insomnia last night "I did not sleep well last night, I woke up at 2 am and just could not go back to sleep".  Reports good appetite, normal energy and good concentration level.  Pt rates his depression 2/10, anxiety 2/10 and hopelessness 2/10. Pt's goal for today is "to catch up with my sleep". A: Introduced self to pt. Emotional support and availability provided to pt.  Scheduled medications administered with verbal education and effects monitored. Compliance with treatment regimen including groups encouraged. Safety checks continues at Q 15 minutes intervals without outburst or self harm gestures to note thus far.   R: Pt receptive to care. Compliant with medications when offered. Denies adverse drug reactions when assessed this shift.  Attended noon group and was engaged. Tolerates all PO intake well. Went off unit for meals. Remains safe on and off unit.

## 2017-10-01 NOTE — Progress Notes (Signed)
Adult Psychoeducational Group Note  Date:  10/01/2017 Time:  9:06 PM  Group Topic/Focus:  Wrap-Up Group:   The focus of this group is to help patients review their daily goal of treatment and discuss progress on daily workbooks.  Participation Level:  Active  Participation Quality:  Appropriate  Affect:  Appropriate  Cognitive:  Appropriate  Insight: Appropriate  Engagement in Group:  Engaged  Modes of Intervention:  Discussion  Additional Comments: The patent expressed that he rate today a 6 .The patient also said that he attended all groups. Octavio Manns 10/01/2017, 9:06 PM

## 2017-10-01 NOTE — Progress Notes (Signed)
Pt observed in dayroom, attending wrap-up group. Pt appears with blunted affect and mood. Pt denies SI/HI/AVH at this time. Pt rates pain 8/10; generalized. Pt states he might get to go home on Monday. Pt is minimal with interaction. PRN vistaril and Ibuprofen requested and given. Continue with POC.

## 2017-10-01 NOTE — Progress Notes (Signed)
Recreation Therapy Notes  Date: 5.10.19 Time: 1000 Location: 500 Hall Dayroom  Group Topic: Stress Management  Goal Area(s) Addresses:  Patient will verbalize importance of using healthy stress management.  Patient will identify positive emotions associated with healthy stress management.   Intervention: Stress Management  Activity :  Loving-Kindness Meditation.  LRT played a meditation on focusing on giving love to people in our lives.  Patients were to follow along as the meditation played.  Education:  Stress Management, Discharge Planning.   Education Outcome: Acknowledges edcuation/In group clarification offered/Needs additional education  Clinical Observations/Feedback: Pt did not attend group.     Caroll Rancher, LRT/CTRS         Caroll Rancher A 10/01/2017 12:16 PM

## 2017-10-02 DIAGNOSIS — R451 Restlessness and agitation: Secondary | ICD-10-CM

## 2017-10-02 DIAGNOSIS — F1290 Cannabis use, unspecified, uncomplicated: Secondary | ICD-10-CM

## 2017-10-02 DIAGNOSIS — Z87891 Personal history of nicotine dependence: Secondary | ICD-10-CM

## 2017-10-02 DIAGNOSIS — Z9114 Patient's other noncompliance with medication regimen: Secondary | ICD-10-CM

## 2017-10-02 DIAGNOSIS — F1490 Cocaine use, unspecified, uncomplicated: Secondary | ICD-10-CM

## 2017-10-02 NOTE — Progress Notes (Signed)
Writer observed patient sitting in the dayroom watching tv and eating snack. He reports having had a good day. Writer informed him of his scheduled medications. He did request motrin for back pain to be given with his medicines. He reports being hopeful to discharge on Monday and will be staying in a camper at his bosses home. Support given and safety maintained on unit with 15 min checks.

## 2017-10-02 NOTE — [facility] Group Notes (Signed)
[facility] Group Notes: (Clinical Social Work)   10/02/2017      Type of Therapy:  Group Therapy   Participation Level:  Did Not Attend - sleeping   Madisun Hargrove Grossman-Orr, LCSW 10/02/2017, 12:03 PM 

## 2017-10-02 NOTE — Progress Notes (Signed)
Adult Psychoeducational Group Note  Date:  10/02/2017 Time:  8:59 PM  Group Topic/Focus:  Wrap-Up Group:   The focus of this group is to help patients review their daily goal of treatment and discuss progress on daily workbooks.  Participation Level:  Active  Participation Quality:  Appropriate  Affect:  Appropriate  Cognitive:  Appropriate  Insight: Appropriate  Engagement in Group:  Engaged  Modes of Intervention:  Discussion  Additional Comments:  Pt stated his goal for today was to talk with doctor about his feet issues. Pt stated he was able to accomplished his goal and felt good about it. Pt stated he will be discharged Monday so he wanted to get everything set up with his next placement.  Felipa Furnace 10/02/2017, 8:59 PM

## 2017-10-02 NOTE — Progress Notes (Signed)
Pt presents with a flat affect and anxious mood. Pt reports decreased anxiety and depression today. Pt denies SI/HI. Pt denies AVH. Pt reports difficulty sleeping at bedtime. Pt stated that the meds aren't effective for sleep. Pt expressed that he's currently considering looking for another place to stay because the roommate that he's currently living with drinks alcohol and that he's trying to stay clean. Pt compliant with taking meds and denies any side effects to meds. Orders reviewed with pt. Verbal support provided. Pt encouraged to attend groups as tolerated. 15 minute checks preformed for safety. Pt compliant with tx plan.

## 2017-10-02 NOTE — Progress Notes (Addendum)
[facility] MD Progress Note  10/02/2017 10:25 AM Logan [name]  MRN:  [id number]  Subjective: Logan Hudson reports, "I'm doing alright. The medicines seem to have gotten me on tract. I was not taken my medicines for 2 weeks, got very depressed, became suicidal & tried to jump in front of a traffic. I was also trying to kill myself by overdose on crack. I'm eating & sleeping well. I will try to stay clean after discharge".  Logan Hudson is a 52 y/o M with history of MDD with psychotic features and cocaine use disorder who was admitted voluntarily from [facility] ED where he presented with worsening depression, SI with plan to walk into traffic, CAH to kill himself, worsening use of cocaine, and medication non-adherence. Pt has recent relevant history of discharge from [facility] inpatient psychiatric unit on 09/20/17 for similar presentation with plan to have outpatient follow up and substance use treatment at [facility]. Pt was medically cleared and transferred to [facility] for additional treatment and stabilization. He was restarted on previous medications of celexa, buspar, and trazodone, and pt requested to be restarted on previously helpful medication of seroquel. Dose of seroquel has been titrated up during his stay. He has been reporting incremental improvement of his presenting symptoms.  Today, 10-02-17, Logan Hudson is seen, chart reviewed. The chart findings discussed with the treatment team. He reports doing alright. Denies any new issues or symptoms. He says his sleep has improved a lot. He states that things got out of hand mentally when he did not take his mental health medications x 2 weeks. He says not only did he get very depressed, he became suicidal as well & attempted to end his life. The attending psychiatrist had discussed with patient about scheduling trazodone at bedtime rather than making it PRN, and he was in agreement. He states that he slept well last night. Pt reports that  overall his mood is improving. He denies SI/HI/AH/VH, and he notes that his current medicates has been helpful for resolving symptoms of AH. Discussed with patient for his plans for after the hospital, and he notes that he plans to return to [place] to stay with a sober friend as he does not feel he could work 40 hours per week at a program such as [employer]. Pt had no further questions, comments, or concerns.  Principal Problem: Severe recurrent major depressive disorder with psychotic features (HCC) Diagnosis:   Patient Active Problem List   Diagnosis Date Noted  . Substance induced mood disorder (HCC) [F19.94] 09/28/2017  . Severe recurrent major depressive disorder with psychotic features (HCC) [F33.3] 09/15/2017  . Suicidal ideation [R45.851] 03/13/2016  . Cocaine abuse without complication (HCC) [F14.10] 05/23/2011   Total Time spent with patient: 15 minutes  Past Psychiatric History: See H&P  Past Medical History:  Past Medical History:  Diagnosis Date  . Depression   . 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 Schizoaffective disorder, bipolar type (HCC) 06/22/2017   History reviewed. No pertinent surgical history.  Family History: History reviewed. No pertinent family history.  Family Psychiatric  History: See H&P  Social History:  Social History   Substance and Sexual Activity  Alcohol Use Yes     Social History   Substance and Sexual Activity  Drug Use Yes  . Types: Cocaine, Marijuana, "Crack" cocaine   Comment: last use 09/27/17    Social History   Socioeconomic History  . Marital status: Widowed  Spouse name: Not on file  . Number of children: Not on file  . Years of education: Not on file  . Highest education level: Not on file  Occupational History  . Not on file  Social Needs  . Financial resource strain: Not on file  . Food insecurity:    Worry: Not on file    Inability: Not on file  .  Transportation needs:    Medical: Not on file    Non-medical: Not on file  Tobacco Use  . Smoking status: Former Games developer  . Smokeless tobacco: Never Used  Substance and Sexual Activity  . Alcohol use: Yes  . Drug use: Yes    Types: Cocaine, Marijuana, "Crack" cocaine    Comment: last use 09/27/17  . Sexual activity: Yes  Lifestyle  . Physical activity:    Days per week: Not on file    Minutes per session: Not on file  . Stress: Not on file  Relationships  . Social connections:    Talks on phone: Not on file    Gets together: Not on file    Attends religious service: Not on file    Active member of club or organization: Not on file    Attends meetings of clubs or organizations: Not on file    Relationship status: Not on file  Other Topics Concern  . Not on file  Social History Narrative  . Not on file   Additional Social History:   Sleep: Good  Appetite:  Good  Current Medications: Current Facility-Administered Medications  Medication Dose Route Frequency Provider Last Rate Last Dose  . alum & mag hydroxide-simeth (MAALOX/MYLANTA) 200-200-20 MG/5ML suspension 30 mL  30 mL Oral Q4H PRN Donell Sievert E, PA-C      . busPIRone (BUSPAR) tablet 7.5 mg  7.5 mg Oral BID Micheal Likens, MD   7.5 mg at 10/02/17 0815  . citalopram (CELEXA) tablet 20 mg  20 mg Oral Daily Micheal Likens, MD   20 mg at 10/02/17 0815  . hydrOXYzine (ATARAX/VISTARIL) tablet 25 mg  25 mg Oral Q6H PRN Kerry Hough, PA-C   25 mg at 10/01/17 2103  . ibuprofen (ADVIL,MOTRIN) tablet 800 mg  800 mg Oral Q8H PRN Kerry Hough, PA-C   800 mg at 10/01/17 2103  . OLANZapine zydis (ZYPREXA) disintegrating tablet 10 mg  10 mg Oral Q8H PRN Micheal Likens, MD       And  . LORazepam (ATIVAN) tablet 1 mg  1 mg Oral PRN Micheal Likens, MD       And  . ziprasidone (GEODON) injection 20 mg  20 mg Intramuscular PRN Micheal Likens, MD      . magnesium hydroxide (MILK  OF MAGNESIA) suspension 30 mL  30 mL Oral Daily PRN Donell Sievert E, PA-C      . QUEtiapine (SEROQUEL) tablet 200 mg  200 mg Oral QHS Micheal Likens, MD   200 mg at 10/01/17 2104   And  . QUEtiapine (SEROQUEL) tablet 50 mg  50 mg Oral Veatrice Kells, MD   50 mg at 10/02/17 0815  . traZODone (DESYREL) tablet 50 mg  50 mg Oral QHS Micheal Likens, MD   50 mg at 10/01/17 2103   Lab Results: No results found for this or any previous visit (from the past 48 hour(s)).  Blood Alcohol level:  Lab Results  Component Value Date   ETH <10 09/27/2017   ETH <5  03/12/2016   Metabolic Disorder Labs: Lab Results  Component Value Date   HGBA1C 5.3 06/23/2017   MPG 105.41 06/23/2017   No results found for: PROLACTIN Lab Results  Component Value Date   CHOL 151 06/23/2017   TRIG 96 06/23/2017   HDL 61 06/23/2017   CHOLHDL 2.5 06/23/2017   VLDL 19 06/23/2017   LDLCALC 71 06/23/2017    Physical Findings: AIMS: Facial and Oral Movements Muscles of Facial Expression: None, normal Lips and Perioral Area: None, normal Jaw: None, normal Tongue: None, normal,Extremity Movements Upper (arms, wrists, hands, fingers): None, normal Lower (legs, knees, ankles, toes): None, normal, Trunk Movements Neck, shoulders, hips: None, normal, Overall Severity Severity of abnormal movements (highest score from questions above): None, normal Incapacitation due to abnormal movements: None, normal Patient's awareness of abnormal movements (rate only patient's report): No Awareness, Dental Status Current problems with teeth and/or dentures?: Yes Does patient usually wear dentures?: No  CIWA:    COWS:     Musculoskeletal: Strength & Muscle Tone: within normal limits Gait & Station: normal Patient leans: N/A  Psychiatric Specialty Exam: Physical Exam  Nursing note and vitals reviewed.   Review of Systems  Constitutional: Negative for chills and fever.  Respiratory:  Negative for cough and shortness of breath.   Cardiovascular: Negative for chest pain.  Gastrointestinal: Negative for abdominal pain, heartburn, nausea and vomiting.  Psychiatric/Behavioral: Positive for depression. Negative for hallucinations and suicidal ideas. The patient has insomnia. The patient is not nervous/anxious.     Blood pressure 122/68, pulse 80, temperature 97.9 F (36.6 C), temperature source Oral, resp. rate 20, height 6' (1.829 m), weight 104.3 kg (230 lb).Body mass index is 31.19 kg/m.  General Appearance: Casual and Fairly Groomed  Eye Contact:  Good  Speech:  Clear and Coherent and Normal Rate  Volume:  Normal  Mood:  Depressed  Affect:  Appropriate, Congruent and Constricted  Thought Process:  Coherent and Goal Directed  Orientation:  Full (Time, Place, and Person)  Thought Content:  Logical  Suicidal Thoughts:  No  Homicidal Thoughts:  No  Memory:  Immediate;   Fair Recent;   Fair Remote;   Fair  Judgement:  Fair  Insight:  Lacking  Psychomotor Activity:  Normal  Concentration:  Concentration: Fair  Recall:  [employer] of Knowledge:  Fair  Language:  Fair  Akathisia:  No  Handed:    AIMS (if indicated):     Assets:  Resilience Social Support  ADL's:  Intact  Cognition:  WNL  Sleep:  Number of Hours: 6.5   Treatment Plan Summary: Daily contact with patient to assess and evaluate symptoms and progress in treatment and Medication management   -Continue inpatient hospitalization.  -Will continue today 10/02/2017 plan as below except where it is noted.  -MDD, recurrent, severe, with psychotic features             - Continue celexa  po qDay             -Continue Seroquel  q AM + 200 mg po qhs.  -Anxiety              -Continue buspar 7.5mg  po BID             -Continue vistaril  po q6h prn anxiety  -Insomnia              -Continue Trazodone  po qhs (scheduled)  -Agitation              -  Continue zydis  po q8h prn  agitation              -Continue ativan  po once prn agitation              -Continue geodon  IM once prn agitation and refusing oral meds  -Encourage participation in groups and therapeutic milieu  -Disposition planning will be ongoing  Logan Stammer, NP, PMHNP, FNP-BC. 10/02/2017, 10:25 AMPatient ID: Logan [name], male   DOB: [birth year], 52 y.o.   MRN: [id number] .Marland KitchenAgree with NP Progress Note

## 2017-10-03 MED ORDER — HYDROXYZINE HCL 25 MG PO TABS: 25.0000 mg | ORAL_TABLET | Freq: Four times a day (QID) | ORAL | Status: DC | PRN

## 2017-10-03 MED ORDER — OLANZAPINE 10 MG PO TBDP: 10.0000 mg | ORAL_TABLET | Freq: Three times a day (TID) | ORAL | Status: DC | PRN

## 2017-10-03 MED ORDER — ZIPRASIDONE MESYLATE 20 MG IM SOLR: 20.0000 mg | INTRAMUSCULAR | Status: DC | PRN

## 2017-10-03 MED ORDER — LORAZEPAM 1 MG PO TABS: 1.0000 mg | ORAL_TABLET | ORAL | Status: DC | PRN

## 2017-10-03 NOTE — Progress Notes (Signed)
D: Patient presents calm, cooperative, euthymic. Patient denies feeling anxious to RN, and rates anxiety 2/10. Patient says his mood is improving, rating it 2/10 and hopelessness 2/10. Concentration is "good", energy is normal, appetite is good. Sleep is good, and patient did not receive medication for sleep. Patient continuing to have back pain 6/10. Patient denies SI/HI/AVH. A: Patient checked q15 min, and checks reviewed. Reviewed medication changes with patient and educated on side effects. Educated patient on importance of attending group therapy sessions and educated on several coping skills. Encouarged participation in milieu through recreation therapy and attending meals with peers.Administered ibuprofen for back pain.  R: Patient receptive to education on medications, and is medication compliant. Patient attending all group therapy and cafeteria with peers. Patient filled out self inventory. Goal: "stay positive" and to meet this "go to groups." Patient denies back pain after administration, medication effective. Patient contracts for safety on the unit.

## 2017-10-03 NOTE — Progress Notes (Addendum)
[facility] MD Progress Note  10/03/2017 10:50 AM Logan Cliff.  MRN:  [id number]  Subjective: Logan Hudson reports, "It is going well, just feeling a little tired this morning. I hope to go home in the morning as I will not be able to get a ride to go home on Tuesday. I'm feeling really good".  Logan Hudson is a 52 y/o M with history of MDD with psychotic features and cocaine use disorder who was admitted voluntarily from [facility] ED where he presented with worsening depression, SI with plan to walk into traffic, CAH to kill himself, worsening use of cocaine, and medication non-adherence. Pt has recent relevant history of discharge from [facility] inpatient psychiatric unit on 09/20/17 for similar presentation with plan to have outpatient follow up and substance use treatment at [facility]. Pt was medically cleared and transferred to [facility] for additional treatment and stabilization. He was restarted on previous medications of celexa, buspar, and trazodone, and pt requested to be restarted on previously helpful medication of seroquel. Dose of seroquel has been titrated up during his stay. He has been reporting incremental improvement of his presenting symptoms.  Today, 10-03-17, Logan Hudson is seen, chart reviewed. The chart findings discussed with the treatment team. He reports doing well. Denies any new issues. He says he is sleeping well. He currently denies any new concerns. Feels he is ready to be discharged in the morning of 10-04-17. He adds it will be good to get discharged tomorrow as he will not have a ride to go home on Tuesday. He denies SI/HI/AH/VH, and he notes that his current medicates has been helpful for resolving symptoms. He had already planned to return to [place] to stay with a sober friend as he does not feel he could work 40 hours per week at a program such as [employer]. Pt had no further questions, comments, or concerns.  Principal Problem: Severe  recurrent major depressive disorder with psychotic features (HCC) Diagnosis:   Patient Active Problem List   Diagnosis Date Noted  . Substance induced mood disorder (HCC) [F19.94] 09/28/2017  . Severe recurrent major depressive disorder with psychotic features (HCC) [F33.3] 09/15/2017  . Suicidal ideation [R45.851] 03/13/2016  . Cocaine abuse without complication (HCC) [F14.10] 05/23/2011   Total Time spent with patient: 15 minutes  Past Psychiatric History: See H&P  Past Medical History:  Past Medical History:  Diagnosis Date  . Depression   . 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 Schizoaffective disorder, bipolar type (HCC) 06/22/2017   History reviewed. No pertinent surgical history.  Family History: History reviewed. No pertinent family history.  Family Psychiatric  History: See H&P  Social History:  Social History   Substance and Sexual Activity  Alcohol Use Yes     Social History   Substance and Sexual Activity  Drug Use Yes  . Types: Cocaine, Marijuana, "Crack" cocaine   Comment: last use 09/27/17    Social History   Socioeconomic History  . Marital status: Widowed    Spouse name: Not on file  . Number of children: Not on file  . Years of education: Not on file  . Highest education level: Not on file  Occupational History  . Not on file  Social Needs  . Financial resource strain: Not on file  . Food insecurity:    Worry: Not on file    Inability: Not on file  . Transportation needs:    Medical: Not  on file    Non-medical: Not on file  Tobacco Use  . Smoking status: Former Games developer  . Smokeless tobacco: Never Used  Substance and Sexual Activity  . Alcohol use: Yes  . Drug use: Yes    Types: Cocaine, Marijuana, "Crack" cocaine    Comment: last use 09/27/17  . Sexual activity: Yes  Lifestyle  . Physical activity:    Days per week: Not on file    Minutes per session: Not on file  . Stress: Not on file   Relationships  . Social connections:    Talks on phone: Not on file    Gets together: Not on file    Attends religious service: Not on file    Active member of club or organization: Not on file    Attends meetings of clubs or organizations: Not on file    Relationship status: Not on file  Other Topics Concern  . Not on file  Social History Narrative  . Not on file   Additional Social History:   Sleep: Good  Appetite:  Good  Current Medications: Current Facility-Administered Medications  Medication Dose Route Frequency Provider Last Rate Last Dose  . alum & mag hydroxide-simeth (MAALOX/MYLANTA) 200-200-20 MG/5ML suspension 30 mL  30 mL Oral Q4H PRN Donell Sievert E, PA-C      . busPIRone (BUSPAR) tablet 7.5 mg  7.5 mg Oral BID Micheal Likens, MD   7.5 mg at 10/03/17 0819  . citalopram (CELEXA) tablet 20 mg  20 mg Oral Daily Micheal Likens, MD   20 mg at 10/03/17 0819  . hydrOXYzine (ATARAX/VISTARIL) tablet 25 mg  25 mg Oral Q6H PRN Kerry Hough, PA-C   25 mg at 10/01/17 2103  . ibuprofen (ADVIL,MOTRIN) tablet 800 mg  800 mg Oral Q8H PRN Kerry Hough, PA-C   800 mg at 10/03/17 1610  . OLANZapine zydis (ZYPREXA) disintegrating tablet 10 mg  10 mg Oral Q8H PRN Micheal Likens, MD       And  . LORazepam (ATIVAN) tablet 1 mg  1 mg Oral PRN Micheal Likens, MD       And  . ziprasidone (GEODON) injection 20 mg  20 mg Intramuscular PRN Micheal Likens, MD      . magnesium hydroxide (MILK OF MAGNESIA) suspension 30 mL  30 mL Oral Daily PRN Donell Sievert E, PA-C      . QUEtiapine (SEROQUEL) tablet 200 mg  200 mg Oral QHS Micheal Likens, MD   200 mg at 10/02/17 2058   And  . QUEtiapine (SEROQUEL) tablet 50 mg  50 mg Oral Veatrice Kells, MD   50 mg at 10/03/17 0646  . traZODone (DESYREL) tablet 50 mg  50 mg Oral QHS Micheal Likens, MD   50 mg at 10/02/17 2058   Lab Results: No results found for  this or any previous visit (from the past 48 hour(s)).  Blood Alcohol level:  Lab Results  Component Value Date   ETH <10 09/27/2017   ETH <5 03/12/2016   Metabolic Disorder Labs: Lab Results  Component Value Date   HGBA1C 5.3 06/23/2017   MPG 105.41 06/23/2017   No results found for: PROLACTIN Lab Results  Component Value Date   CHOL 151 06/23/2017   TRIG 96 06/23/2017   HDL 61 06/23/2017   CHOLHDL 2.5 06/23/2017   VLDL 19 06/23/2017   LDLCALC 71 06/23/2017   Physical Findings: AIMS: Facial and Oral Movements Muscles  of Facial Expression: None, normal Lips and Perioral Area: None, normal Jaw: None, normal Tongue: None, normal,Extremity Movements Upper (arms, wrists, hands, fingers): None, normal Lower (legs, knees, ankles, toes): None, normal, Trunk Movements Neck, shoulders, hips: None, normal, Overall Severity Severity of abnormal movements (highest score from questions above): None, normal Incapacitation due to abnormal movements: None, normal Patient's awareness of abnormal movements (rate only patient's report): No Awareness, Dental Status Current problems with teeth and/or dentures?: No Does patient usually wear dentures?: No  CIWA:  CIWA-Ar Total: 1 COWS:  COWS Total Score: 0  Musculoskeletal: Strength & Muscle Tone: within normal limits Gait & Station: normal Patient leans: N/A  Psychiatric Specialty Exam: Physical Exam  Nursing note and vitals reviewed.   Review of Systems  Constitutional: Negative for chills and fever.  Respiratory: Negative for cough and shortness of breath.   Cardiovascular: Negative for chest pain.  Gastrointestinal: Negative for abdominal pain, heartburn, nausea and vomiting.  Psychiatric/Behavioral: Positive for depression. Negative for hallucinations and suicidal ideas. The patient has insomnia. The patient is not nervous/anxious.     Blood pressure 97/80, pulse (!) 104, temperature 98 F (36.7 C), temperature source Oral,  resp. rate 20, height 6' (1.829 m), weight 104.3 kg (230 lb).Body mass index is 31.19 kg/m.  General Appearance: Casual and Fairly Groomed  Eye Contact:  Good  Speech:  Clear and Coherent and Normal Rate  Volume:  Normal  Mood:  Depressed  Affect:  Appropriate, Congruent and Constricted  Thought Process:  Coherent and Goal Directed  Orientation:  Full (Time, Place, and Person)  Thought Content:  Logical  Suicidal Thoughts:  No  Homicidal Thoughts:  No  Memory:  Immediate;   Fair Recent;   Fair Remote;   Fair  Judgement:  Fair  Insight:  Lacking  Psychomotor Activity:  Normal  Concentration:  Concentration: Fair  Recall:  [employer] of Knowledge:  Fair  Language:  Fair  Akathisia:  No  Handed:    AIMS (if indicated):     Assets:  Resilience Social Support  ADL's:  Intact  Cognition:  WNL  Sleep:  Number of Hours: 6.75   Treatment Plan Summary: Daily contact with patient to assess and evaluate symptoms and progress in treatment and Medication management   -Continue inpatient hospitalization.  -Will continue today 10/03/2017 plan as below except where it is noted.  -MDD, recurrent, severe, with psychotic features             - Continue celexa  po qDay             -Continue Seroquel  q AM + 200 mg po qhs.  -Anxiety              -Continue buspar 7.5mg  po BID             -Continue vistaril  po q6h prn anxiety  -Insomnia              -Continue Trazodone  po qhs.  -Agitation              -Continue zydis  po q8h prn agitation              -Continue ativan  po once prn agitation              -Continue geodon  IM once prn agitation and refusing oral meds  -Encourage participation in groups and therapeutic milieu  -Disposition planning will be ongoing  Armandina Stammer, NP, PMHNP, FNP-BC.  10/03/2017, 10:50 AMPatient ID: Logan Cliff., male   DOB: 21-Oct-1965, 52 y.o.   MRN: [id number] .Marland KitchenAgree with NP Progress Note

## 2017-10-03 NOTE — Progress Notes (Signed)
Writer has observed patient in his room resting most of the evening and did not attend group. Writer spoke with him 1:1 and asked if he felt ok since not being up tonight and he reported that he was a little tired since being up during the day. He is hopeful to discharge on tomorrow because he reports he will not have a ride on Tuesday.He was compliant with his medications and c/o back pain. He received ibuprofen for pain and returned to his room to rest.

## 2017-10-03 NOTE — BHH Group Notes (Signed)
[facility] LCSW Group Therapy Note  Date/Time:  10/03/2017  11:00AM-12:00PM  Type of Therapy and Topic:  Group Therapy:  Music and Mood  Participation Level:  Active   Description of Group: In this process group, members listened to a variety of genres of music and identified that different types of music evoke different responses.  Patients were encouraged to identify music that was soothing for them and music that was energizing for them.  Patients discussed how this knowledge can help with wellness and recovery in various ways including managing depression and anxiety as well as encouraging healthy sleep habits.    Therapeutic Goals: 1. Patients will explore the impact of different varieties of music on mood 2. Patients will verbalize the thoughts they have when listening to different types of music 3. Patients will identify music that is soothing to them as well as music that is energizing to them 4. Patients will discuss how to use this knowledge to assist in maintaining wellness and recovery 5. Patients will explore the use of music as a coping skill  Summary of Patient Progress:  At the beginning of group, patient expressed that he felt positive and after participating fully, said he felt "mellow" at the end of group.  Therapeutic Modalities: Solution Focused Brief Therapy Activity   Ambrose Mantle, LCSW

## 2017-10-03 NOTE — Plan of Care (Signed)
Patient demonstrates improved mood, sleep, and decreased anxiety. Attended group and participated by filling out self inventory sheet.

## 2017-10-04 MED ORDER — CITALOPRAM HYDROBROMIDE 20 MG PO TABS: 20.0000 mg | ORAL_TABLET | Freq: Every day | ORAL | 0 refills | Status: DC

## 2017-10-04 MED ORDER — QUETIAPINE FUMARATE 50 MG PO TABS: 50.0000 mg | ORAL_TABLET | ORAL | 0 refills | Status: DC

## 2017-10-04 MED ORDER — BUSPIRONE HCL 7.5 MG PO TABS: 7.5000 mg | ORAL_TABLET | Freq: Two times a day (BID) | ORAL | 0 refills | Status: DC

## 2017-10-04 MED ORDER — QUETIAPINE FUMARATE 200 MG PO TABS: 200.0000 mg | ORAL_TABLET | Freq: Every day | ORAL | 0 refills | Status: DC

## 2017-10-04 MED ORDER — BUSPIRONE HCL 7.5 MG PO TABS
7.5000 mg | ORAL_TABLET | Freq: Two times a day (BID) | ORAL | Status: DC
  Filled 2017-10-04: qty 14

## 2017-10-04 MED ORDER — TRAZODONE HCL 50 MG PO TABS: 50.0000 mg | ORAL_TABLET | Freq: Every day | ORAL | 0 refills | Status: DC

## 2017-10-04 NOTE — Progress Notes (Signed)
  [facility] Adult Case Management Discharge Plan :  Will you be returning to the same living situation after discharge:  No. Will be staying with his boss. At discharge, do you have transportation home?: Yes,  boss Do you have the ability to pay for your medications: No. Will work with [employer].  Release of information consent forms completed and in the chart;  Patient's signature needed at discharge.  Patient to Follow up at: Follow-up Information    [facility]. Go on 10/07/2017.   Why:  Please attend your appt on Thursday, 10/07/17, at 8:45am.  Please bring a copy of your hospital discharge paperwork. Contact information: [address] [place] [address] [phone]           Next level of care provider has access to [facility] Link:no  Safety Planning and Suicide Prevention discussed: No.Pt refused consent.  Have you used any form of tobacco in the last 30 days? (Cigarettes, Smokeless Tobacco, Cigars, and/or Pipes): No  Has patient been referred to the Quitline?: N/A patient is not a smoker  Patient has been referred for addiction treatment: Yes  Lorri Frederick, LCSW 10/04/2017, 10:04 AM

## 2017-10-04 NOTE — Progress Notes (Signed)
Pt received both written and verbal discharge instructions. Pt verbalized understanding of discharge instructions. Pt agreed to f/u appt and med regimen. Pt received d/c packet, sample meds and prescriptions. Pt gathered belongings from room and locker. Pt safely discharged to the lobby and transported to [facility] by [facility].

## 2017-10-04 NOTE — [facility] Group Notes (Signed)
[facility] LCSW Group Therapy Note  Date/Time: 10/04/17, 1315  Type of Therapy and Topic:  Group Therapy:  Overcoming Obstacles  Participation Level:  active  Description of Group:    In this group patients will be encouraged to explore what they see as obstacles to their own wellness and recovery. They will be guided to discuss their thoughts, feelings, and behaviors related to these obstacles. The group will process together ways to cope with barriers, with attention given to specific choices patients can make. Each patient will be challenged to identify changes they are motivated to make in order to overcome their obstacles. This group will be process-oriented, with patients participating in exploration of their own experiences as well as giving and receiving support and challenge from other group members.  Therapeutic Goals: 1. Patient will identify personal and current obstacles as they relate to admission. 2. Patient will identify barriers that currently interfere with their wellness or overcoming obstacles.  3. Patient will identify feelings, thought process and behaviors related to these barriers. 4. Patient will identify two changes they are willing to make to overcome these obstacles:    Summary of Patient Progress: Pt attended about half the group and then left.  Pt identified substance use as the primary obstacle in his life and was able to talk about attending meetings and using his boss in order to support his efforts to stay clean from drugs.      Therapeutic Modalities:   Cognitive Behavioral Therapy Solution Focused Therapy Motivational Interviewing Relapse Prevention Therapy  Daleen Squibb, LCSW

## 2017-10-04 NOTE — BHH Suicide Risk Assessment (Signed)
[facility] Discharge Suicide Risk Assessment   Principal Problem: Severe recurrent major depressive disorder with psychotic features [facility]) Discharge Diagnoses:  Patient Active Problem List   Diagnosis Date Noted  . Substance induced mood disorder (HCC) [F19.94] 09/28/2017  . Severe recurrent major depressive disorder with psychotic features (HCC) [F33.3] 09/15/2017  . Suicidal ideation [R45.851] 03/13/2016  . Cocaine abuse without complication (HCC) [F14.10] 05/23/2011    Total Time spent with patient: 30 minutes  Musculoskeletal: Strength & Muscle Tone: within normal limits Gait & Station: normal Patient leans: N/A  Psychiatric Specialty Exam: Review of Systems  Constitutional: Negative for chills and fever.  Respiratory: Negative for cough and shortness of breath.   Cardiovascular: Negative for chest pain.  Gastrointestinal: Negative for abdominal pain, heartburn, nausea and vomiting.  Psychiatric/Behavioral: Negative for depression, hallucinations and suicidal ideas. The patient is not nervous/anxious and does not have insomnia.     Blood pressure 119/78, pulse (!) 102, temperature 98 F (36.7 C), temperature source Oral, resp. rate 18, height 6' (1.829 m), weight 104.3 kg (230 lb).Body mass index is 31.19 kg/m.  General Appearance: Casual and Fairly Groomed  Patent attorney::  Good  Speech:  Clear and Coherent and Normal Rate  Volume:  Normal  Mood:  Euthymic  Affect:  Appropriate, Congruent and Constricted  Thought Process:  Coherent and Goal Directed  Orientation:  Full (Time, Place, and Person)  Thought Content:  Logical  Suicidal Thoughts:  No  Homicidal Thoughts:  No  Memory:  Immediate;   Fair Recent;   Fair Remote;   Fair  Judgement:  Fair  Insight:  Fair  Psychomotor Activity:  Normal  Concentration:  Fair  Recall:  [employer] of Knowledge:Fair  Language: Fair  Akathisia:  No  Handed:    AIMS (if indicated):     Assets:  Communication Skills Resilience Social  Support  Sleep:  Number of Hours: 6.75  Cognition: WNL  ADL's:  Intact   Mental Status Per Nursing Assessment::   On Admission:     Demographic Factors:  Male, Caucasian, Low socioeconomic status and Unemployed  Loss Factors: Financial problems/change in socioeconomic status  Historical Factors: Prior suicide attempts, Family history of mental illness or substance abuse and Impulsivity  Risk Reduction Factors:   Living with another person, especially a relative, Positive social support, Positive therapeutic relationship and Positive coping skills or problem solving skills  Continued Clinical Symptoms:  Severe Anxiety and/or Agitation Depression:   Comorbid alcohol abuse/dependence Impulsivity Severe Alcohol/Substance Abuse/Dependencies More than one psychiatric diagnosis Unstable or Poor Therapeutic Relationship Previous Psychiatric Diagnoses and Treatments  Cognitive Features That Contribute To Risk:  None    Suicide Risk:  Minimal: No identifiable suicidal ideation.  Patients presenting with no risk factors but with morbid ruminations; may be classified as minimal risk based on the severity of the depressive symptoms  Subjective Data:  Logan Hudson is a 52 y/o M with history of MDD with psychotic features and cocaine use disorder who was admitted voluntarily from [facility] ED where he presented with worsening depression, SI with plan to walk into traffic, CAH to kill himself, worsening use of cocaine, and medication non-adherence. Pt has recent relevant history of discharge from [facility] inpatient psychiatric unit on 09/20/17 for similar presentation with plan to have outpatient follow up and substance use treatment at [facility]. Pt was medically cleared and transferred to [facility] for additional treatment and stabilization.He was restarted on previous medications of celexa, buspar, and  trazodone, and pt requested to be restarted on previously helpful  medication of seroquel. Dose of seroquel has been titrated up during his stay. He reported improvement of his presenting symptoms.  Today upon evaluation, pt shares, "I'm doing good. My boss man said I could come stay with him and work with him." Pt denies any specific concerns today. He is sleeping well. His appetite is good. He denies physical complaints. He denies SI/HI/AH/VH. He reports that his medications have been helpful and he is tolerating them well without difficulty or side effects. He is in agreement to continue his current regimen without changes. He plans to follow up at [facility] for outpatient care. He plans to attend AA/NA meetings to help maintain his sobriety. He was able to engage in safety planning including plan to return to [facility] or contact emergency services if he feels unable to maintain his own safety or the safety of others. Pt had no further questions, comments, or concerns.   Plan Of Care/Follow-up recommendations:   -Discharge to outpatient level of care  -MDD, recurrent, severe, with psychotic features - Continue celexa  po qDay -Continue Seroquel  q AM + 200 mg po qhs.  -Anxiety -Continue buspar 7.5mg  po BID -Continue vistaril  po q6h prn anxiety  -Insomnia -Continue Trazodone  po qhs.  Activity:  as tolerated Diet:  normal Tests:  NA Other:  see above for DC plan  Micheal Likens, MD 10/04/2017, 8:55 AM

## 2017-10-04 NOTE — Progress Notes (Signed)
Recreation Therapy Notes  INPATIENT RECREATION TR PLAN  Patient Details Name: Logan [name] MRN: [id number] DOB: [birth year] Today's Date: 10/04/2017  Rec Therapy Plan Is patient appropriate for Therapeutic Recreation?: Yes Treatment times per week: about 3 days Estimated Length of Stay: 5-7 days TR Treatment/Interventions: Group participation (Comment)  Discharge Criteria Pt will be discharged from therapy if:: Discharged Treatment plan/goals/alternatives discussed and agreed upon by:: Patient/family  Discharge Summary Short term goals set: See care plan Short term goals met: Adequate for discharge Progress toward goals comments: Groups attended Which groups?: Goal setting Reason goals not met: Pt attended one group. Therapeutic equipment acquired: N/A Reason patient discharged from therapy: Discharge from hospital Pt/family agrees with progress & goals achieved: Yes Date patient discharged from therapy: 10/04/17    Victorino Sparrow, LRT/CTRS  Ria Comment, Samhitha Rosen A 10/04/2017, 11:17 AM

## 2017-10-04 NOTE — Discharge Summary (Addendum)
Physician Discharge Summary Note  Patient:  Logan [name] is an 52 y.o., male MRN:  [id number] DOB:  [birth year] Patient phone:  [phone] (home)  Patient address:   [address] [place] [address],  Total Time spent with patient: 45 minutes  Date of Admission:  09/28/2017  Date of Discharge: 10/04/2017   Reason for Admission:  Modified from H&P: "Logan Hudson is a 52 y/o M with history of MDD with psychotic features and cocaine use disorder who was admitted voluntarily from [facility] ED where he presented with worsening depression, SI with plan to walk into traffic, CAH to kill himself, worsening use of cocaine, and medication non-adherence. Pt has recent relevant history of discharge from [facility] inpatient psychiatric unit on 09/20/17 for similar presentation with plan to have outpatient follow up and substance use treatment at [facility]. Pt was medically cleared and transferred to [facility] for additional treatment and stabilization.  Upon initial presentation, pt shares, "I tried to OD on cocaine, and then I tried to jump in front of traffic. I couldn't get my meds. It didn't work out with [employer]. I fell of a ladder when I was there, and they took me to the ED and left me there, so I went to stay with a friend. Then I relapsed." Of note, pt was supplied with 30-day supply of his medications at time of discharge from [facility] on 4/29. Pt reports depressive symptoms of poor sleep with middle insomnia, anhedonia, guilty feelings, poor concentration, psychomotor retardation, and suicidal ideation with plan to walk into traffic. He denies HI. He reports CAH to kill himself for the past few days. He denies VH. He denies symptoms of bipolar mania, OCD, and PTSD. He reports that he only has been using crack cocaine and he went on a binge of $300 worth of crack cocaine prior to presenting to the ED."  Principal Problem: Severe recurrent major depressive disorder with  psychotic features [facility]) Discharge Diagnoses: Patient Active Problem List   Diagnosis Date Noted  . Substance induced mood disorder ([place]) [F19.94] 09/28/2017  . Severe recurrent major depressive disorder with psychotic features ([place]) [F33.3] 09/15/2017  . Suicidal ideation [R45.851] 03/13/2016  . Cocaine abuse without complication [facility]) [R93.96] 05/23/2011    Past Psychiatric History: see H&P  Past Medical History:  Past Medical History:  Diagnosis Date  . Depression   . MDD (major depressive disorder), recurrent severe, without psychosis ([place]) 03/12/2016  . Schizoaffective disorder, bipolar type ([place]) 06/22/2017   History reviewed. No pertinent surgical history. Family History: History reviewed. No pertinent family history. Family Psychiatric  History: see H&P Social History:  Social History   Substance and Sexual Activity  Alcohol Use Yes     Social History   Substance and Sexual Activity  Drug Use Yes  . Types: Cocaine, Marijuana, "Crack" cocaine   Comment: last use 09/27/17    Social History   Socioeconomic History  . Marital status: Widowed    Spouse name: Not on file  . Number of children: Not on file  . Years of education: Not on file  . Highest education level: Not on file  Occupational History  . Not on file  Social Needs  . Financial resource strain: Not on file  . Food insecurity:    Worry: Not on file    Inability: Not on file  . Transportation needs:    Medical: Not on file    Non-medical: Not on file  Tobacco Use  . Smoking status: Former Research scientist (life sciences)  . Smokeless tobacco: Never Used  Substance and Sexual Activity  . Alcohol use: Yes  . Drug use: Yes    Types: Cocaine, Marijuana, "Crack" cocaine    Comment: last use 09/27/17  . Sexual activity: Yes  Lifestyle  . Physical activity:    Days per week: Not on file    Minutes per session: Not on file  . Stress: Not on file  Relationships  . Social connections:    Talks on phone: Not on file    Gets  together: Not on file    Attends religious service: Not on file    Active member of club or organization: Not on file    Attends meetings of clubs or organizations: Not on file    Relationship status: Not on file  Other Topics Concern  . Not on file  Social History Narrative  . Not on file    Hospital Course:   [place]. was admitted for Severe recurrent major depressive disorder with psychotic features ([place]) , and crisis management.  Pt was treated discharged with the medications listed below under Medication List.  Medical problems were identified and treated as needed.  Home medications were restarted as appropriate.  Improvement was monitored by observation and Logan Hudson daily report of symptom reduction.  Emotional and mental status was monitored by daily self-inventory reports completed by Logan [name] and clinical staff.         Logan Patrick Hermenia Fiscal. was evaluated by the treatment team for stability and plans for continued recovery upon discharge. Logan Hudson motivation was an integral factor for scheduling further treatment. Employment, transportation, bed availability, health status, family support, and any pending legal issues were also considered during hospital stay. Pt was offered further treatment options upon discharge including but not limited to Residential, Intensive Outpatient, and Outpatient treatment.  Logan Patrick Hermenia Fiscal. will follow up with the services as listed below under Follow Up Information.     Upon completion of this admission the patient was both mentally and medically stable for discharge denying suicidal/homicidal ideation, auditory/visual/tactile hallucinations, delusional thoughts and paranoia.    Logan Hudson well to treatment with Seroquel, Zyprexa, Ativan, Buspar, Celexa, and Vistaril without adverse effects. Pt demonstrated improvement without reported or observed adverse effects to  the point of stability appropriate for outpatient management. Pertinent labs include: UDS+ cocaine, Alk Phos 130H, AST 74H, ALT 175H, WBC 15.2H, for which outpatient follow-up is necessary for lab recheck as mentioned below. Reviewed CBC, CMP, BAL, and UDS; all unremarkable aside from noted exceptions.    Physical Findings: AIMS: Facial and Oral Movements Muscles of Facial Expression: None, normal Lips and Perioral Area: None, normal Jaw: None, normal Tongue: None, normal,Extremity Movements Upper (arms, wrists, hands, fingers): None, normal Lower (legs, knees, ankles, toes): None, normal, Trunk Movements Neck, shoulders, hips: None, normal, Overall Severity Severity of abnormal movements (highest score from questions above): None, normal Incapacitation due to abnormal movements: None, normal PatientHudson awareness of abnormal movements (rate only patientHudson report): No Awareness, Dental Status Current problems with teeth and/or dentures?: No Does patient usually wear dentures?: No  CIWA:  CIWA-Ar Total: 1 COWS:  COWS Total Score: 0  Musculoskeletal: Strength & Muscle Tone: within normal limits Gait & Station: normal Patient leans: N/A  Psychiatric Specialty Exam: Physical Exam  Review of Systems  Psychiatric/Behavioral: Positive for substance abuse. Negative  for depression, hallucinations and suicidal ideas. The patient is not nervous/anxious and does not have insomnia.   All other systems reviewed and are negative.   Blood pressure 119/80, pulse (!) 102, temperature 98 F (36.7 C), temperature source Oral, resp. rate 18, height 6' (1.829 m), weight 104.3 kg (230 lb).Body mass index is 31.19 kg/m.  SEE MD PSE WITHIN SRA    Have you used any form of tobacco in the last 30 days? (Cigarettes, Smokeless Tobacco, Cigars, and/or Pipes): No  Has this patient used any form of tobacco in the last 30 days? (Cigarettes, Smokeless Tobacco, Cigars, and/or Pipes) No  Blood Alcohol level:  Lab  Results  Component Value Date   ETH <10 09/27/2017   ETH <5 96/75/9163    Metabolic Disorder Labs:  Lab Results  Component Value Date   HGBA1C 5.3 06/23/2017   MPG 105.41 06/23/2017   No results found for: PROLACTIN Lab Results  Component Value Date   CHOL 151 06/23/2017   TRIG 96 06/23/2017   HDL 61 06/23/2017   CHOLHDL 2.5 06/23/2017   VLDL 19 06/23/2017   LDLCALC 71 06/23/2017    See Psychiatric Specialty Exam and Suicide Risk Assessment completed by Attending Physician prior to discharge.  Discharge destination:  Home  Is patient on multiple antipsychotic therapies at discharge:  No   Has Patient had three or more failed trials of antipsychotic monotherapy by history:  No  Recommended Plan for Multiple Antipsychotic Therapies: NA  Discharge Instructions    Diet - low sodium heart healthy   Complete by:  As directed    Discharge instructions   Complete by:  As directed    Take all medications as prescribed. Keep all follow-up appointments as scheduled.  Do not consume alcohol or use illegal drugs while on prescription medications. Report any adverse effects from your medications to your primary care provider promptly.  In the event of recurrent symptoms or worsening symptoms, call 911, a crisis hotline, or go to the nearest emergency department for evaluation.   Increase activity slowly   Complete by:  As directed      Allergies as of 10/04/2017      Reactions   Asa [aspirin]    Nicotine    Paxil [paroxetine Hcl]    Tylenol [acetaminophen]       Medication List    STOP taking these medications   ibuprofen 800 MG tablet Commonly known as:  ADVIL,MOTRIN     TAKE these medications     Indication  busPIRone 7.5 MG tablet Commonly known as:  BUSPAR Take 1 tablet (7.5 mg total) by mouth 2 (two) times daily.  Indication:  Symptoms of Feeling Anxious   citalopram 20 MG tablet Commonly known as:  CELEXA Take 1 tablet (20 mg total) by mouth daily.   Indication:  Depression   QUEtiapine 200 MG tablet Commonly known as:  SEROQUEL Take 1 tablet (200 mg total) by mouth at bedtime. What changed:  Another medication with the same name was changed. Make sure you understand how and when to take each.  Indication:  Agitation, Generalized Anxiety Disorder, Major Depressive Disorder   QUEtiapine 50 MG tablet Commonly known as:  SEROQUEL Take 1 tablet (50 mg total) by mouth every morning. Start taking on:  10/05/2017 What changed:    medication strength  how much to take  when to take this  Indication:  Generalized Anxiety Disorder   traZODone 50 MG tablet Commonly known as:  DESYREL Take  1 tablet (50 mg total) by mouth at bedtime.  Indication:  Excessive Use of Alcohol, Major Depressive Disorder      Follow-up Information    [facility]. Go on 10/07/2017.   Why:  Please attend your appt on Thursday, 10/07/17, at 8:45am.  Please bring a copy of your hospital discharge paperwork. Contact information: [address] [place] [address] [phone]           Follow-up recommendations:  Activity:  As tolerated Diet:  Heart healthy with low sodium.  Comments:   Take all medications as prescribed. Keep all follow-up appointments as scheduled.  Do not consume alcohol or use illegal drugs while on prescription medications. Report any adverse effects from your medications to your primary care provider promptly.  In the event of recurrent symptoms or worsening symptoms, call 911, a crisis hotline, or go to the nearest emergency department for evaluation.    Signed: Benjamine Mola, FNP 10/04/2017, 1:19 PM   Patient seen, Suicide Assessment Completed.  Disposition Plan Reviewed

## 2017-10-04 NOTE — [facility]HH Group Notes (Signed)
[facility] Group Notes:  (Nursing/MHT/Case Management/Adjunct)  Date:  10/04/2017  Time:  10:26 AM  Type of Therapy:  Orientation & Goals Group  Participation Level:  Active  Participation Quality:  Appropriate  Affect:  Appropriate  Cognitive:  Alert and Appropriate  Insight:  Good  Engagement in Group:  Engaged  Modes of Intervention:  Discussion and Orientation  Summary of Progress/Problems: His goal for today is to go home and to be able to follow through with his follow-up appointments.   Logan Hudson 10/04/2017, 10:26 AM

## 2017-10-04 NOTE — Plan of Care (Signed)
Pt expressed some coping skills at completion of goal setting recreation therapy session.   Caroll Rancher, LRT/CTRS

## 2017-10-04 NOTE — Progress Notes (Signed)
Recreation Therapy Notes  Date: 5.13.19 Time: 0950 Location: [address] Dayroom  Group Topic: Goal Setting  Goal Area(s) Addresses:  Patient will be able to identify at least 3 life goals.  Patient will be able to identify benefit of investing in goals.  Patient will be able to identify benefit of setting life goals.   Behavioral Response:  Engaged  Intervention: Worksheet  Activity: Garment/textile technologist.  Patients were to identify goals they wanted to accomplish in a week, month, year and 5 years.  Patients were to then identify any obstacles they would face, what they need to accomplish their goal and what they can start doing to reach their goals.  Education:  Discharge Planning, Pharmacologist, Leisure Education   Education Outcome: Acknowledges Education/In Group Clarification Provided/Needs Additional Education  Clinical Observations: Pt stated goals are "something to work towards".  Pt expressed in a week he wants to stay clean and stay on his medications; in a month he wants to go to AA/NA meetings and stay around positive people; have his own place in a year and in five years find another relationship.  Pt stated his obstacles were staying away from drugs; needs to attend meetings and can start today by "staying around positive people and go to meetings".    Caroll Rancher, LRT/CTRS      Caroll Rancher A 10/04/2017 10:41 AM

## 2017-10-07 ENCOUNTER — Encounter (HOSPITAL_COMMUNITY): Payer: Self-pay | Admitting: Emergency Medicine

## 2017-10-07 ENCOUNTER — Emergency Department (HOSPITAL_COMMUNITY)
Admission: EM | Admit: 2017-10-07 | Discharge: 2017-10-08 | Disposition: A | Discharge status: Home / Self Care | Payer: Self-pay | Attending: Emergency Medicine | Admitting: Emergency Medicine

## 2017-10-07 ENCOUNTER — Other Ambulatory Visit: Payer: Self-pay

## 2017-10-07 DIAGNOSIS — Z87891 Personal history of nicotine dependence: Secondary | ICD-10-CM | POA: Insufficient documentation

## 2017-10-07 DIAGNOSIS — Z79899 Other long term (current) drug therapy: Secondary | ICD-10-CM | POA: Insufficient documentation

## 2017-10-07 DIAGNOSIS — F1910 Other psychoactive substance abuse, uncomplicated: Secondary | ICD-10-CM

## 2017-10-07 DIAGNOSIS — F1420 Cocaine dependence, uncomplicated: Secondary | ICD-10-CM | POA: Insufficient documentation

## 2017-10-07 LAB — URINALYSIS, ROUTINE W REFLEX MICROSCOPIC
BILIRUBIN URINE: NEGATIVE
Bacteria, UA: NONE SEEN
Glucose, UA: NEGATIVE mg/dL
Hgb urine dipstick: NEGATIVE
KETONES UR: 20 mg/dL — AB
LEUKOCYTES UA: NEGATIVE
Nitrite: NEGATIVE
PH: 5 (ref 5.0–8.0)
PROTEIN: 30 mg/dL — AB
Specific Gravity, Urine: 1.028 (ref 1.005–1.030)

## 2017-10-07 LAB — CBC
HEMATOCRIT: 44.2 % (ref 39.0–52.0)
Hemoglobin: 14.7 g/dL (ref 13.0–17.0)
MCH: 28 pg (ref 26.0–34.0)
MCHC: 33.3 g/dL (ref 30.0–36.0)
MCV: 84.2 fL (ref 78.0–100.0)
Platelets: 344 10*3/uL (ref 150–400)
RBC: 5.25 MIL/uL (ref 4.22–5.81)
RDW: 13.8 % (ref 11.5–15.5)
WBC: 10.1 10*3/uL (ref 4.0–10.5)

## 2017-10-07 LAB — COMPREHENSIVE METABOLIC PANEL
ALBUMIN: 4.3 g/dL (ref 3.5–5.0)
ALK PHOS: 89 U/L (ref 38–126)
ALT: 43 U/L (ref 17–63)
ANION GAP: 15 (ref 5–15)
AST: 48 U/L — ABNORMAL HIGH (ref 15–41)
BILIRUBIN TOTAL: 1.8 mg/dL — AB (ref 0.3–1.2)
BUN: 26 mg/dL — ABNORMAL HIGH (ref 6–20)
CALCIUM: 9.2 mg/dL (ref 8.9–10.3)
CO2: 22 mmol/L (ref 22–32)
Chloride: 100 mmol/L — ABNORMAL LOW (ref 101–111)
Creatinine, Ser: 1.48 mg/dL — ABNORMAL HIGH (ref 0.61–1.24)
GFR calc non Af Amer: 53 mL/min — ABNORMAL LOW (ref >60)
GLUCOSE: 104 mg/dL — AB (ref 65–99)
POTASSIUM: 3.3 mmol/L — AB (ref 3.5–5.1)
Sodium: 137 mmol/L (ref 135–145)
TOTAL PROTEIN: 7.9 g/dL (ref 6.5–8.1)

## 2017-10-07 LAB — RAPID URINE DRUG SCREEN, HOSP PERFORMED
Amphetamines: NOT DETECTED
BARBITURATES: NOT DETECTED
BENZODIAZEPINES: NOT DETECTED
COCAINE: POSITIVE — AB
OPIATES: NOT DETECTED
Tetrahydrocannabinol: NOT DETECTED

## 2017-10-07 LAB — ACETAMINOPHEN LEVEL

## 2017-10-07 LAB — ETHANOL: Alcohol, Ethyl (B): <10 mg/dL (ref <10)

## 2017-10-07 LAB — TROPONIN I

## 2017-10-07 LAB — SALICYLATE LEVEL: Salicylate Lvl: <7 mg/dL (ref 2.8–30.0)

## 2017-10-07 LAB — CK: Total CK: 576 U/L — ABNORMAL HIGH (ref 49–397)

## 2017-10-07 MED ORDER — ONDANSETRON HCL 4 MG PO TABS: 4.0000 mg | ORAL_TABLET | Freq: Three times a day (TID) | ORAL | Status: DC | PRN

## 2017-10-07 MED ORDER — SODIUM CHLORIDE 0.9 % IV BOLUS
1000.0000 mL | Freq: Once | INTRAVENOUS | Status: AC
  Administered 2017-10-07: 1000 mL via INTRAVENOUS

## 2017-10-07 MED ORDER — TRAZODONE HCL 50 MG PO TABS
50.0000 mg | ORAL_TABLET | Freq: Every day | ORAL | Status: DC
  Administered 2017-10-07: 50 mg via ORAL
  Filled 2017-10-07: qty 1

## 2017-10-07 MED ORDER — QUETIAPINE FUMARATE 100 MG PO TABS
200.0000 mg | ORAL_TABLET | Freq: Every day | ORAL | Status: DC
  Administered 2017-10-07: 200 mg via ORAL
  Filled 2017-10-07: qty 2

## 2017-10-07 MED ORDER — IBUPROFEN 400 MG PO TABS: 600.0000 mg | ORAL_TABLET | Freq: Three times a day (TID) | ORAL | Status: DC | PRN

## 2017-10-07 MED ORDER — BUSPIRONE HCL 15 MG PO TABS
7.5000 mg | ORAL_TABLET | Freq: Two times a day (BID) | ORAL | Status: DC
  Administered 2017-10-07 – 2017-10-08 (×2): 7.5 mg via ORAL
  Filled 2017-10-07 (×6): qty 1
  Filled 2017-10-07: qty 2
  Filled 2017-10-07 (×2): qty 1

## 2017-10-07 MED ORDER — ALUM & MAG HYDROXIDE-SIMETH 200-200-20 MG/5ML PO SUSP: 30.0000 mL | Freq: Four times a day (QID) | ORAL | Status: DC | PRN

## 2017-10-07 MED ORDER — CITALOPRAM HYDROBROMIDE 20 MG PO TABS
20.0000 mg | ORAL_TABLET | Freq: Every day | ORAL | Status: DC
  Administered 2017-10-07 – 2017-10-08 (×2): 20 mg via ORAL
  Filled 2017-10-07 (×6): qty 1

## 2017-10-07 MED ORDER — QUETIAPINE FUMARATE 25 MG PO TABS
50.0000 mg | ORAL_TABLET | ORAL | Status: DC
  Administered 2017-10-08: 50 mg via ORAL
  Filled 2017-10-07: qty 2

## 2017-10-07 MED ORDER — POTASSIUM CHLORIDE CRYS ER 20 MEQ PO TBCR
40.0000 meq | EXTENDED_RELEASE_TABLET | Freq: Once | ORAL | Status: AC
  Administered 2017-10-07: 40 meq via ORAL
  Filled 2017-10-07: qty 2

## 2017-10-07 NOTE — ED Notes (Signed)
PT was standing up next to the stretcher with security to be wanded and fell and hit his right foot on the side of the stretcher and the back of his head on the sitter's chair. PT was able to get out of the floor with little assistance and laid back down on the stretcher. EDP made aware of fall.

## 2017-10-07 NOTE — ED Provider Notes (Signed)
[facility] EMERGENCY DEPARTMENT Provider Note   CSN: [id number] Arrival date & time: 10/07/17  5366     History   Chief Complaint Chief Complaint  Patient presents with  . Drug Overdose  . Suicidal    HPI Logan [name] is a 52 y.o. male.  HPI Patient states he has been attempting to kill himself over the last 3 days by smoking crack.  Last use this morning.  States over the past 3 days he is not been able to sleep.  He has had little oral intake.  States his urine has been discolored.  Currently denying pain specifically chest pain or abdominal pain.  Denies any other ingestion.  Patient is having lightheadedness especially with standing. Past Medical History:  Diagnosis Date  . Depression   . 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 Schizoaffective disorder, bipolar type (HCC) 06/22/2017    Patient Active Problem List   Diagnosis Date Noted  . Substance induced mood disorder (HCC) 09/28/2017  . Severe recurrent major depressive disorder with psychotic features (HCC) 09/15/2017  . Suicidal ideation 03/13/2016  . Cocaine abuse without complication (HCC) 05/23/2011    History reviewed. No pertinent surgical history.      Home Medications    Prior to Admission medications   Medication Sig Start Date End Date Taking? Authorizing Provider  busPIRone (BUSPAR) 7.5 MG tablet Take 1 tablet (7.5 mg total) by mouth 2 (two) times daily. 10/04/17   Withrow, Everardo All, FNP  citalopram (CELEXA) 20 MG tablet Take 1 tablet (20 mg total) by mouth daily. 10/04/17   Withrow, Everardo All, FNP  QUEtiapine (SEROQUEL) 200 MG tablet Take 1 tablet (200 mg total) by mouth at bedtime. 10/04/17   Oneta Rack, NP  QUEtiapine (SEROQUEL) 50 MG tablet Take 1 tablet (50 mg total) by mouth every morning. 10/05/17   Oneta Rack, NP  traZODone (DESYREL) 50 MG tablet Take 1 tablet (50 mg total) by mouth at bedtime. 10/04/17   Oneta Rack, NP     Family History History reviewed. No pertinent family history.  Social History Social History   Tobacco Use  . Smoking status: Former Games developer  . Smokeless tobacco: Never Used  Substance Use Topics  . Alcohol use: Yes  . Drug use: Yes    Types: Cocaine, Marijuana, "Crack" cocaine    Comment: last use 10/07/17     Allergies   Asa [aspirin]; Nicotine; Paxil [paroxetine hcl]; and Tylenol [acetaminophen]   Review of Systems Review of Systems  Constitutional: Positive for appetite change. Negative for chills.  HENT: Negative for facial swelling and trouble swallowing.   Eyes: Negative for visual disturbance.  Respiratory: Negative for shortness of breath.   Cardiovascular: Negative for chest pain, palpitations and leg swelling.  Gastrointestinal: Negative for abdominal pain, diarrhea, nausea and vomiting.  Genitourinary: Negative for dysuria, flank pain and frequency.  Musculoskeletal: Negative for arthralgias, myalgias and neck pain.  Skin: Negative for rash and wound.  Neurological: Positive for dizziness and light-headedness. Negative for syncope, weakness, numbness and headaches.  Psychiatric/Behavioral: Positive for dysphoric mood, hallucinations and suicidal ideas.  All other systems reviewed and are negative.    Physical Exam Updated Vital Signs BP 105/84 (BP Location: Left Arm)   Pulse (!) 108   Temp 98 F (36.7 C)   Resp 18   Ht 6' (1.829 m)   Wt 104.3 kg (230 lb)   SpO2 98%   BMI 31.19 kg/m  Physical Exam  Constitutional: He is oriented to person, place, and time. He appears well-developed and well-nourished. No distress.  HENT:  Head: Normocephalic and atraumatic.  Mouth/Throat: Oropharynx is clear and moist.  No obvious head trauma.  Midface is stable.  No intraoral trauma.  Eyes: Pupils are equal, round, and reactive to light. EOM are normal.  Neck: Normal range of motion. Neck supple.  No posterior midline cervical tenderness to palpation.  No  meningismus.  Cardiovascular: Normal rate and regular rhythm. Exam reveals no gallop and no friction rub.  No murmur heard. Pulmonary/Chest: Effort normal and breath sounds normal.  Abdominal: Soft. Bowel sounds are normal. There is no tenderness. There is no rebound and no guarding.  Musculoskeletal: Normal range of motion. He exhibits no edema or tenderness.  No lower extremity swelling, asymmetry or tenderness.  No midline thoracic or lumbar tenderness.  Pelvis is stable.  Distal pulses intact.  Neurological: He is alert and oriented to person, place, and time.  5/5 motor in all extremities.  Sensation intact.  Skin: Skin is warm and dry. No rash noted. He is not diaphoretic. No erythema.  Psychiatric:  Endorses SI  Nursing note and vitals reviewed.    ED Treatments / Results  Labs (all labs ordered are listed, but only abnormal results are displayed) Labs Reviewed  COMPREHENSIVE METABOLIC PANEL - Abnormal; Notable for the following components:      Result Value   Potassium 3.3 (*)    Chloride 100 (*)    Glucose, Bld 104 (*)    BUN 26 (*)    Creatinine, Ser 1.48 (*)    AST 48 (*)    Total Bilirubin 1.8 (*)    GFR calc non Af Amer 53 (*)    All other components within normal limits  ACETAMINOPHEN LEVEL - Abnormal; Notable for the following components:   Acetaminophen (Tylenol), Serum <10 (*)    All other components within normal limits  RAPID URINE DRUG SCREEN, HOSP PERFORMED - Abnormal; Notable for the following components:   Cocaine POSITIVE (*)    All other components within normal limits  URINALYSIS, ROUTINE W REFLEX MICROSCOPIC - Abnormal; Notable for the following components:   Ketones, ur 20 (*)    Protein, ur 30 (*)    All other components within normal limits  CK - Abnormal; Notable for the following components:   Total CK 576 (*)    All other components within normal limits  BASIC METABOLIC PANEL - Abnormal; Notable for the following components:   Glucose, Bld  100 (*)    Calcium 8.3 (*)    All other components within normal limits  ETHANOL  SALICYLATE LEVEL  CBC  TROPONIN I  CK    EKG EKG Interpretation  Date/Time:  Thursday Oct 07 2017 09:47:11 EDT Ventricular Rate:  128 PR Interval:  116 QRS Duration: 78 QT Interval:  320 QTC Calculation: 467 R Axis:   64 Text Interpretation:  Sinus tachycardia Otherwise normal ECG Confirmed by Loren Racer ([id number]) on 10/07/2017 11:21:26 AM   Radiology No results found.  Procedures Procedures (including critical care time)  Medications Ordered in ED Medications  sodium chloride 0.9 % bolus 1,000 mL (0 mLs Intravenous Stopped 10/07/17 1345)  sodium chloride 0.9 % bolus 1,000 mL (0 mLs Intravenous Stopped 10/07/17 1520)  potassium chloride SA (K-DUR,KLOR-CON) CR tablet 40 mEq (40 mEq Oral Given 10/07/17 1356)  sodium chloride 0.9 % bolus 1,000 mL (0 mLs Intravenous Stopped 10/07/17 1810)  Initial Impression / Assessment and Plan / ED Course  I have reviewed the triage vital signs and the nursing notes.  Pertinent labs & imaging results that were available during my care of the patient were reviewed by me and considered in my medical decision making (see chart for details).    Patient's tachycardia is improving with IV fluids.  Mild elevation in CK and creatinine.  Likely due to recent cocaine use and lack of fluid intake.  Will re-dose IV fluids.  Signed out pending reexam and psychiatric consultation.   Final Clinical Impressions(s) / ED Diagnoses   [facility]    ED Discharge Orders    None       Loren Racer, MD 10/09/17 0830

## 2017-10-07 NOTE — ED Provider Notes (Signed)
4:39 PM Assumed care from Dr. Ranae Palms, please see their note for full history, physical and decision making until this point. In brief this is a 52 y.o. year old male who presented to the ED tonight with Drug Overdose     Patient is suicidal with a plan to kill himself by taking too many drugs.  He is tried to start this process by using cocaine for the last 3 days and is significantly dehydrated.  Patient is receiving fluid rehydration.  Pending reevaluation for improved heart rate and clinical dehydration before medical clearance for TTS consultation.    Labs, studies and imaging reviewed by myself and considered in medical decision making if ordered. Imaging interpreted by radiology.  Labs Reviewed  COMPREHENSIVE METABOLIC PANEL - Abnormal; Notable for the following components:      Result Value   Potassium 3.3 (*)    Chloride 100 (*)    Glucose, Bld 104 (*)    BUN 26 (*)    Creatinine, Ser 1.48 (*)    AST 48 (*)    Total Bilirubin 1.8 (*)    GFR calc non Af Amer 53 (*)    All other components within normal limits  ACETAMINOPHEN LEVEL - Abnormal; Notable for the following components:   Acetaminophen (Tylenol), Serum <10 (*)    All other components within normal limits  RAPID URINE DRUG SCREEN, HOSP PERFORMED - Abnormal; Notable for the following components:   Cocaine POSITIVE (*)    All other components within normal limits  URINALYSIS, ROUTINE W REFLEX MICROSCOPIC - Abnormal; Notable for the following components:   Ketones, ur 20 (*)    Protein, ur 30 (*)    All other components within normal limits  CK - Abnormal; Notable for the following components:   Total CK 576 (*)    All other components within normal limits  ETHANOL  SALICYLATE LEVEL  CBC  TROPONIN I    No orders to display    No follow-ups on file.    Taber Sweetser, Barbara Cower, MD 10/08/17 [phone]

## 2017-10-07 NOTE — ED Triage Notes (Signed)
Pt reports he attempted overdose all night on crack cocaine, states he used an 8 ball by smoking it. Has hx of SI. States he has not been taking his medications. Pt is hearing voices telling him to kill himself. Denies HI.

## 2017-10-07 NOTE — BH Assessment (Signed)
Tele Assessment Note   Patient Name: Logan [name] MRN: [id number] Referring Physician: Dr. Clayborne Dana Location of Patient: [facility] Location of Provider: Behavioral Health TTS Department  Logan [name] is an 52 y.o. male. Pt states he attempted to overdose on crack cocaine. Pt reports prior SI attempts. Pt admits to long-term crack cocaine use. Pt was D/C from [facility] 10th. Pt has been hospitalized multiple times. Pt has been hospitalized 3x in 2019. Pt states he has not followed up with outpatient resources that have been provided to him to due transportation issues.   Pt states he is interested in long-term SA services.   Pt will be observed overnight and reassessed in the am for safety and stabilization.   Diagnosis:  F14.20 Cocaine use, severe  Past Medical History:  Past Medical History:  Diagnosis Date  . Depression   . 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 Schizoaffective disorder, bipolar type (HCC) 06/22/2017    History reviewed. No pertinent surgical history.  Family History: History reviewed. No pertinent family history.  Social History:  reports that he has quit smoking. He has never used smokeless tobacco. He reports that he drinks alcohol. He reports that he has current or past drug history. Drugs: Cocaine, Marijuana, and "Crack" cocaine.  Additional Social History:  Alcohol / Drug Use Pain Medications: please see mar Prescriptions: please see mar Over the Counter: please see mar History of alcohol / drug use?: Yes Longest period of sobriety (when/how long): unknown Negative Consequences of Use: Legal, Financial, Personal relationships, Work / School Substance #1 Name of Substance 1: crack cocaine 1 - Age of First Use: unknown 1 - Amount (size/oz): unknown 1 - Frequency: daily 1 - Duration: ongoing 1 - Last Use / Amount: 10/07/17  CIWA: CIWA-Ar BP: 96/63 Pulse Rate: 90 COWS:    Allergies:   Allergies  Allergen Reactions  . Asa [Aspirin]   . Nicotine   . Paxil [Paroxetine Hcl]   . Tylenol [Acetaminophen]     Home Medications:  (Not in a hospital admission)  OB/GYN Status:  No LMP for male patient.  General Assessment Data Location of Assessment: [facility] TTS Assessment: In system Is this a Tele or Face-to-Face Assessment?: Tele Assessment Is this an Initial Assessment or a Re-assessment for this encounter?: Initial Assessment Marital status: Separated Maiden name: NA Is patient pregnant?: No Pregnancy Status: No Living Arrangements: Other (Comment) Can pt return to current living arrangement?: Yes Admission Status: Voluntary Is patient capable of signing voluntary admission?: Yes Referral Source: Self/Family/Friend Insurance type: SP     Crisis Care Plan Living Arrangements: Other (Comment) Legal Guardian: Other: Name of Psychiatrist: [facility] Name of Therapist: NONE  Education Status Is patient currently in school?: No  Risk to self with the past 6 months Suicidal Ideation: No-Not Currently/Within Last 6 Months Has patient been a risk to self within the past 6 months prior to admission? : Yes Suicidal Intent: No Has patient had any suicidal intent within the past 6 months prior to admission? : Yes Is patient at risk for suicide?: No Suicidal Plan?: No Has patient had any suicidal plan within the past 6 months prior to admission? : Yes Specify Current Suicidal Plan: to jump in front of cars Access to Means: Yes Specify Access to Suicidal Means: past access to traffic What has been your use of drugs/alcohol within the last 12 months?: crack cocaine Previous Attempts/Gestures: Yes How many times?: 1 Other Self  Harm Risks: NA Triggers for Past Attempts: None known Intentional Self Injurious Behavior: None Family Suicide History: No Recent stressful life event(s): Other (Comment)(homelessness) Persecutory voices/beliefs?: No Depression:  Yes Depression Symptoms: Isolating, Loss of interest in usual pleasures Substance abuse history and/or treatment for substance abuse?: Yes Suicide prevention information given to non-admitted patients: Not applicable  Risk to Others within the past 6 months Homicidal Ideation: No Does patient have any lifetime risk of violence toward others beyond the six months prior to admission? : No Thoughts of Harm to Others: No Current Homicidal Intent: No Current Homicidal Plan: No Access to Homicidal Means: No Identified Victim: nA History of harm to others?: No Assessment of Violence: None Noted Violent Behavior Description: Na Does patient have access to weapons?: No Criminal Charges Pending?: No Does patient have a court date: No Is patient on probation?: No  Psychosis Hallucinations: None noted Delusions: None noted  Mental Status Report Appearance/Hygiene: Unremarkable, In scrubs Eye Contact: Fair Motor Activity: Freedom of movement Speech: Logical/coherent Level of Consciousness: Alert Mood: Anxious Affect: Anxious Anxiety Level: Minimal Thought Processes: Coherent, Relevant Judgement: Unimpaired Orientation: Person, Place, Time Obsessive Compulsive Thoughts/Behaviors: None  Cognitive Functioning Concentration: Normal Memory: Recent Intact, Remote Intact Is patient IDD: No Is patient DD?: No Insight: Fair Impulse Control: Fair Appetite: Fair Have you had any weight changes? : No Change Sleep: No Change Total Hours of Sleep: 8 Vegetative Symptoms: None  ADLScreening [facility]) Patient's cognitive ability adequate to safely complete daily activities?: Yes Patient able to express need for assistance with ADLs?: Yes Independently performs ADLs?: Yes (appropriate for developmental age)  Prior Inpatient Therapy Prior Inpatient Therapy: Yes Prior Therapy Dates: MULTIPLE Prior Therapy Facilty/Provider(s): MULTIPLE Reason for Treatment: SA  Prior  Outpatient Therapy Prior Outpatient Therapy: No Does patient have an ACCT team?: No Does patient have Intensive In-House Services?  : No Does patient have [facility] services? : No Does patient have P4CC services?: No  ADL Screening (condition at time of admission) Patient's cognitive ability adequate to safely complete daily activities?: Yes Is the patient deaf or have difficulty hearing?: No Does the patient have difficulty seeing, even when wearing glasses/contacts?: No Does the patient have difficulty concentrating, remembering, or making decisions?: No Patient able to express need for assistance with ADLs?: Yes Does the patient have difficulty dressing or bathing?: No Independently performs ADLs?: Yes (appropriate for developmental age)       Abuse/Neglect Assessment (Assessment to be complete while patient is alone) Abuse/Neglect Assessment Can Be Completed: Yes Physical Abuse: Denies Verbal Abuse: Denies Sexual Abuse: Denies Exploitation of patient/patient's resources: Denies     Merchant navy officer (For Healthcare) Does Patient Have a Medical Advance Directive?: No Would patient like information on creating a medical advance directive?: No - Patient declined    Additional Information 1:1 In Past 12 Months?: No CIRT Risk: No Elopement Risk: No Does patient have medical clearance?: Yes     Disposition:  Disposition Initial Assessment Completed for this Encounter: Yes Disposition of Patient: (am psych for stablization) Patient refused recommended treatment: No  This service was provided via telemedicine using a 2-way, interactive audio and video technology.  Names of all persons participating in this telemedicine service and their role in this encounter. Name: Assunta Found Role: NP  Name:  Role:   Name:  Role:   Name:  Role:     Emmit Pomfret 10/07/2017 6:33 PM

## 2017-10-08 LAB — BASIC METABOLIC PANEL
Anion gap: 6 (ref 5–15)
BUN: 19 mg/dL (ref 6–20)
CHLORIDE: 109 mmol/L (ref 101–111)
CO2: 24 mmol/L (ref 22–32)
CREATININE: 1.21 mg/dL (ref 0.61–1.24)
Calcium: 8.3 mg/dL — ABNORMAL LOW (ref 8.9–10.3)
GFR calc non Af Amer: >60 mL/min (ref >60)
Glucose, Bld: 100 mg/dL — ABNORMAL HIGH (ref 65–99)
Potassium: 4.3 mmol/L (ref 3.5–5.1)
Sodium: 139 mmol/L (ref 135–145)

## 2017-10-08 LAB — CK: Total CK: 283 U/L (ref 49–397)

## 2017-10-08 NOTE — ED Notes (Signed)
PT resting comfortably

## 2017-10-08 NOTE — ED Provider Notes (Signed)
TTS has re-evaluated pt: pt denies SI/HI, pt does not meet inpt criteria at this time, requests to d/c with outpt resources. Will d/c stable.    Samuel Jester, DO 10/08/17 1046

## 2017-10-08 NOTE — Discharge Instructions (Addendum)
Substance Abuse Treatment Programs ° °Intensive Outpatient Programs °[facility]     °[address]      °[place], [place]                   °[phone]      ° °[facility] °[address] °[place], [place] °[phone] ° °[facility]     °(Inpatient and outpatient)     °[address]           °[phone]   ° °[facility] °336-288-1484 (Suboxone and Methadone) ° °[address]      °[place], [place] [address]      °336-882-2125      ° °[address] °[place], [place] °852-3033 ° °[facility] (Outpatient/Inpatient, Chemical)    °(insurance only) [phone]      °       °[facility] (Groups & Residential) °[place], [place] °336-389-1413 ° °   °[facility]     °[address]     °[place], [place]      °336-389-1413      ° °[facility] (for caregivers and family) °[address] °[place], [place] °336-299-4655 ° ° ° ° ° °Residential Treatment Programs °[facility]      °[address], [place], [place] [address]  °([phone]      ° °[facility] °[address] [place], [place] [address] °[phone] ° °[facility]        °336-248-8914      ° °[facility] °1-800-659-3381 ° °[facility]             °[address]                                         °[place], [place]                                                °877-615-2722 or 336-784-9470                              ° °[facility] [place] °[address] °[place], [address] °1.877.941.8954 ° °[facility]    °[address]      °, [place]     °336-273-5306      ° °[facility] °[address] °, [place] °336-285-9073 ° °[facility]   °[address]     °[place], [place] [address]     °336-899-1550      °Admissions: 8am-3pm M-F ° °[facility] °[address] °[place] °336-227-7417 ° °[facility]: Residential Program (90 Days)   °[place], [place]      °336-725-8389 or [phone]    ° °[facility] °[place], [place] °(Walk in Hours over the weekend or by referral) ° °[facility] °[address], [place], [place] [address] °(336) [phone] ° °Crisis Mobile: [facility]:  1-877-626-1772 (for crisis response 24 hours a day) °[facility] Hotline:      1-800-256-2452 °Outpatient Psychiatry and Counseling ° °[facility]: Mobile Crisis   Management 24 hours:  1-877-626-1772 ° °[facility] sliding scale fee and walk in schedule: M-F 8am-12pm/1pm-3pm °[address]  °[place], [place] [address] °336-387-6161 ° °[facility] °[address] °[place], [place] [address] °336-703-9650 ° °[facility] ([facility] known [facility])- new patient walk-in appointments available Monday - Friday 8am -3pm.          °[address] °[place], [place] [address] °336-676-6840 or crisis line- 336-676-6905 ° °[facility] Intensive Outpatient Therapy Program °[address] °[place], [place] [address] °336-832-9804 ° °[facility]                  °Crisis Services      °336.641.4993      °[address]     °[place], [place] [address]                ° °[facility]   °[facility] °[phone] °[address] °[place], [place] [address] ° ° °[facility]          °[address],  °[place], [place] [address]       °(336) [phone] ° °[facility] °[address] °[place], [place] [address] °336-292-1510 ° °[facility]    °[address]    °[place], [place] [address]     °336-632-3505      ° °[facility]an McKinney, Logan Hudson     °[address]     °[place] [address]     °336-282-1251     °  °[facility] °[address] °[place] [address] ° °[facility]     °[address]     °[place], [place]      °336-542-2076      ° °[facility] °Logan Ahluwalia, Logan Hudson °[address] °[place], [place] [address] °336-420-9558 ° °[facility]     °[address]     °[place], [place] [address]     °336-274-1237      ° °[facility] °[address] °[place], [place] [address] °336-854-4450 °Resources for Temporary Residential Assistance/Crisis Centers ° °DAY CENTERS °[facility] °M-F 8am-3pm   °[address] [place], [place] [address]   336-332-0824 °Services include: laundry, barbering, support groups, case management, phone  & computer access, showers, AA/NA mtgs, mental health/substance abuse nurse, job skills class, disability information, VA assistance, spiritual classes, etc.  ° °HOMELESS SHELTERS ° °[facility]     °[facility]   °[address], [place]     °336.271.5959       °       °[facility] (women and children)       °[address] °[place], [place] [address] °336-275-0820 °[email or web address] for application and process °Application Required ° °[facility]   °[address]    °[place] [address]     °336.886.4922       °             °[facility] °[address] °[place], [place] [address] °336.273.5572 °336-235-0363(schedule application appt.) °Application Required ° °[facility] (women only)    °[address]     °[place], [place] [address]     °336-884-1039      °  Intake starts 6pm daily Need valid ID, SSC, & Police report [employer] [address] [place], [place] [phone] Application Required  [facility] (men only)     [address]      [place], [place]     [phone]       [facility] (Pregnant women only) [address] [place], [place] [phone]  [facility]      [address]      [place], [place] [address]     [phone]             [facility] [address] [place], [place] [phone] 90 day commitment/SA/Application process  [facility](men only)     [address]     [place], [place]     [phone]       Check-in at [facility] [address] [place], [place] [address] [phone] Men/Women/Women and Children must be there by 7 pm  [facility] [place], [place] [phone]                  Take your usual prescriptions as directed.  Call mental health resources given to you today to schedule a follow up appointment within the next week.  Return to the Emergency Department immediately sooner if worsening.

## 2017-10-08 NOTE — ED Notes (Signed)
TTS complete 

## 2017-10-08 NOTE — Progress Notes (Signed)
Patient is seen by me via tele-psych and I have consulted with Dr. Lucianne Muss.  Patient denies any SI/HI/AVH and contracts for safety today.  He reports that he is requesting long-term rehabilitation treatment, however, patient does not have any insurance and is made aware that extreme long-term treatment is expensive.  He agrees to have referral to [facility].  Patient does not meet criteria for inpatient treatment and is psychiatrically cleared.

## 2017-10-08 NOTE — ED Notes (Signed)
Pt says that he has not drank alcohol on 2 months.  attempted to overdose on crack cocaine for 3 days.

## 2017-10-08 NOTE — Progress Notes (Addendum)
Patient was assessed by Physician Extender, Reola Calkins, NP, and no longer meets criteria for inpatient. Discharge with referrals to [facility] and [facility] made with the recommendation that pt follow-up with both organizations as he expresses a desire for long term residential SA treatment. Bard Herbert, [place] [facility] notified. Contact information for both organizations faxed to [facility] ED.   Timmothy Euler. Kaylyn Lim, MSW, LCSWA Disposition Clinical Social Work [phone] (cell) [phone] (office)  [facility] does not have beds but is willing to do a prescreening with client.  He is recommended to call and ask for Wynona Canes prior to discharge this morning.

## 2017-10-09 ENCOUNTER — Other Ambulatory Visit: Payer: Self-pay

## 2017-10-09 ENCOUNTER — Encounter (HOSPITAL_COMMUNITY): Payer: Self-pay | Admitting: Emergency Medicine

## 2017-10-09 ENCOUNTER — Emergency Department (HOSPITAL_COMMUNITY)
Admission: EM | Admit: 2017-10-09 | Discharge: 2017-10-11 | Disposition: A | Discharge status: Home / Self Care | Payer: Self-pay | Attending: Emergency Medicine | Admitting: Emergency Medicine

## 2017-10-09 DIAGNOSIS — R45851 Suicidal ideations: Secondary | ICD-10-CM | POA: Insufficient documentation

## 2017-10-09 DIAGNOSIS — Z87891 Personal history of nicotine dependence: Secondary | ICD-10-CM | POA: Insufficient documentation

## 2017-10-09 DIAGNOSIS — F1410 Cocaine abuse, uncomplicated: Secondary | ICD-10-CM | POA: Insufficient documentation

## 2017-10-09 DIAGNOSIS — F329 Major depressive disorder, single episode, unspecified: Secondary | ICD-10-CM | POA: Insufficient documentation

## 2017-10-09 DIAGNOSIS — Z79899 Other long term (current) drug therapy: Secondary | ICD-10-CM | POA: Insufficient documentation

## 2017-10-09 DIAGNOSIS — F419 Anxiety disorder, unspecified: Secondary | ICD-10-CM | POA: Insufficient documentation

## 2017-10-09 DIAGNOSIS — Z046 Encounter for general psychiatric examination, requested by authority: Secondary | ICD-10-CM | POA: Insufficient documentation

## 2017-10-09 LAB — COMPREHENSIVE METABOLIC PANEL
ALT: 29 U/L (ref 17–63)
AST: 27 U/L (ref 15–41)
Albumin: 4.2 g/dL (ref 3.5–5.0)
Alkaline Phosphatase: 79 U/L (ref 38–126)
Anion gap: 13 (ref 5–15)
BILIRUBIN TOTAL: 1 mg/dL (ref 0.3–1.2)
BUN: 16 mg/dL (ref 6–20)
CO2: 23 mmol/L (ref 22–32)
Calcium: 9.4 mg/dL (ref 8.9–10.3)
Chloride: 104 mmol/L (ref 101–111)
Creatinine, Ser: 1.45 mg/dL — ABNORMAL HIGH (ref 0.61–1.24)
GFR calc Af Amer: >60 mL/min (ref >60)
GFR calc non Af Amer: 54 mL/min — ABNORMAL LOW (ref >60)
GLUCOSE: 99 mg/dL (ref 65–99)
POTASSIUM: 3.4 mmol/L — AB (ref 3.5–5.1)
Sodium: 140 mmol/L (ref 135–145)
TOTAL PROTEIN: 7.6 g/dL (ref 6.5–8.1)

## 2017-10-09 LAB — ACETAMINOPHEN LEVEL: Acetaminophen (Tylenol), Serum: <10 ug/mL — ABNORMAL LOW (ref 10–30)

## 2017-10-09 LAB — TROPONIN I: Troponin I: <0.03 ng/mL (ref <0.03)

## 2017-10-09 LAB — ETHANOL: Alcohol, Ethyl (B): <10 mg/dL (ref <10)

## 2017-10-09 LAB — CBC
HEMATOCRIT: 40.5 % (ref 39.0–52.0)
Hemoglobin: 13.3 g/dL (ref 13.0–17.0)
MCH: 27.8 pg (ref 26.0–34.0)
MCHC: 32.8 g/dL (ref 30.0–36.0)
MCV: 84.6 fL (ref 78.0–100.0)
Platelets: 355 10*3/uL (ref 150–400)
RBC: 4.79 MIL/uL (ref 4.22–5.81)
RDW: 13.4 % (ref 11.5–15.5)
WBC: 8.4 10*3/uL (ref 4.0–10.5)

## 2017-10-09 LAB — SALICYLATE LEVEL: Salicylate Lvl: <7 mg/dL (ref 2.8–30.0)

## 2017-10-09 LAB — CK: Total CK: 195 U/L (ref 49–397)

## 2017-10-09 NOTE — ED Triage Notes (Signed)
Patient states "I don't have anything to live for. I'm trying to kill myself with crack cocaine." States he has smoked "an 8 ball all day" last used 45 minutes prior to arrival. Denies chest pain.

## 2017-10-09 NOTE — ED Provider Notes (Signed)
[facility] EMERGENCY DEPARTMENT Provider Note   CSN: [id number] Arrival date & time: 10/09/17  2143     History   Chief Complaint Chief Complaint  Patient presents with  . V70.1    HPI Logan [name] is a 52 y.o. male.  Patient presents with suicidal ideation and cocaine abuse.  This is his third presentation for similar symptoms since May 6.  He states he used $200 worth of crack today by snorting it.  Denies any injection drug use.  He denies any alcohol use.  States compliance with his medications.  Stays been outside in the heat all day with vague thoughts of wanting to hurt himself.  Does not have an active plan at this time.  States he could overdose on crack.  He denies any hallucinations or homicidal thoughts.  No chest pain or shortness of breath.  No headache.  No abdominal pain, nausea or vomiting.  The history is provided by the patient.    Past Medical History:  Diagnosis Date  . Depression   . 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 Schizoaffective disorder, bipolar type (HCC) 06/22/2017    Patient Active Problem List   Diagnosis Date Noted  . Substance induced mood disorder (HCC) 09/28/2017  . Severe recurrent major depressive disorder with psychotic features (HCC) 09/15/2017  . Suicidal ideation 03/13/2016  . Cocaine abuse without complication (HCC) 05/23/2011    History reviewed. No pertinent surgical history.      Home Medications    Prior to Admission medications   Medication Sig Start Date End Date Taking? Authorizing Provider  busPIRone (BUSPAR) 7.5 MG tablet Take 1 tablet (7.5 mg total) by mouth 2 (two) times daily. 10/04/17  Yes Withrow, Everardo All, FNP  citalopram (CELEXA) 20 MG tablet Take 1 tablet (20 mg total) by mouth daily. 10/04/17  Yes Withrow, Everardo All, FNP  QUEtiapine (SEROQUEL) 200 MG tablet Take 1 tablet (200 mg total) by mouth at bedtime. 10/04/17  Yes Oneta Rack, NP  QUEtiapine  (SEROQUEL) 50 MG tablet Take 1 tablet (50 mg total) by mouth every morning. 10/05/17  Yes Oneta Rack, NP  traZODone (DESYREL) 50 MG tablet Take 1 tablet (50 mg total) by mouth at bedtime. 10/04/17  Yes Oneta Rack, NP    Family History History reviewed. No pertinent family history.  Social History Social History   Tobacco Use  . Smoking status: Former Games developer  . Smokeless tobacco: Never Used  Substance Use Topics  . Alcohol use: Yes  . Drug use: Yes    Types: Cocaine, Marijuana, "Crack" cocaine    Comment: last use 10/07/17     Allergies   Asa [aspirin]; Nicotine; Paxil [paroxetine hcl]; and Tylenol [acetaminophen]   Review of Systems Review of Systems  Constitutional: Negative for activity change, appetite change and fever.  HENT: Negative for congestion.   Eyes: Negative for visual disturbance.  Respiratory: Negative for chest tightness and shortness of breath.   Gastrointestinal: Negative for abdominal pain.  Genitourinary: Negative for dysuria, hematuria and penile pain.  Musculoskeletal: Negative for arthralgias and back pain.  Neurological: Negative for dizziness, weakness and headaches.  Psychiatric/Behavioral: Positive for decreased concentration, dysphoric mood and suicidal ideas. Negative for behavioral problems. The patient is nervous/anxious.     all other systems are negative except as noted in the HPI and PMH.    Physical Exam Updated Vital Signs BP 93/67 (BP Location: Right Arm)   Pulse Marland Kitchen)  121   Temp 98.3 F (36.8 C) (Oral)   Resp (!) 22   Ht 6' (1.829 m)   Wt 104.3 kg (230 lb)   SpO2 98%   BMI 31.19 kg/m   Physical Exam  Constitutional: He is oriented to person, place, and time. He appears well-developed and well-nourished. No distress.  Calm and cooperative  HENT:  Head: Normocephalic and atraumatic.  Mouth/Throat: Oropharynx is clear and moist. No oropharyngeal exudate.  Eyes: Pupils are equal, round, and reactive to light.  Conjunctivae and EOM are normal.  Neck: Normal range of motion. Neck supple.  No meningismus.  Cardiovascular: Normal rate, normal heart sounds and intact distal pulses.  No murmur heard. tachycardic  Pulmonary/Chest: Effort normal and breath sounds normal. No respiratory distress. He exhibits no tenderness.  Abdominal: Soft. There is no tenderness. There is no rebound and no guarding.  Musculoskeletal: Normal range of motion. He exhibits no edema or tenderness.  Neurological: He is alert and oriented to person, place, and time. No cranial nerve deficit. He exhibits normal muscle tone. Coordination normal.  No ataxia on finger to nose bilaterally. No pronator drift. 5/5 strength throughout. CN 2-12 intact.Equal grip strength. Sensation intact.   Skin: Skin is warm. Capillary refill takes less than 2 seconds. No pallor.  Psychiatric: He has a normal mood and affect. His behavior is normal.  Nursing note and vitals reviewed.    ED Treatments / Results  Labs (all labs ordered are listed, but only abnormal results are displayed) Labs Reviewed  COMPREHENSIVE METABOLIC PANEL - Abnormal; Notable for the following components:      Result Value   Potassium 3.4 (*)    Creatinine, Ser 1.45 (*)    GFR calc non Af Amer 54 (*)    All other components within normal limits  ACETAMINOPHEN LEVEL - Abnormal; Notable for the following components:   Acetaminophen (Tylenol), Serum <10 (*)    All other components within normal limits  RAPID URINE DRUG SCREEN, HOSP PERFORMED - Abnormal; Notable for the following components:   Cocaine POSITIVE (*)    All other components within normal limits  ETHANOL  SALICYLATE LEVEL  CBC  TROPONIN I  TROPONIN I  CK    EKG EKG Interpretation  Date/Time:  Saturday Oct 09 2017 21:53:08 EDT Ventricular Rate:  126 PR Interval:  116 QRS Duration: 80 QT Interval:  314 QTC Calculation: 454 R Axis:   80 Text Interpretation:  Sinus tachycardia Otherwise normal ECG  No significant change was found Confirmed by Glynn Octave [phone]) on 10/09/2017 11:30:27 PM   Radiology No results found.  Procedures Procedures (including critical care time)  Medications Ordered in ED Medications - No data to display   Initial Impression / Assessment and Plan / ED Course  I have reviewed the triage vital signs and the nursing notes.  Pertinent labs & imaging results that were available during my care of the patient were reviewed by me and considered in my medical decision making (see chart for details).    Patient represents with suicidal ideation and ongoing cocaine abuse.  Third similar presentation since May 6.  Screening labs obtained and show mild elevation of creatinine.  CK is normal.  Patient given IV and p.o. fluids.  Drug screen is again positive for cocaine.  EKG is reassuring and troponin is negative.  Patient denies chest pain or shortness of breath.  He is medically clear for psychiatric evaluation.  Heart rate has improved to the 90s.  TTS consult completed.  Patient recommended for evaluation by psychiatry in the morning given recent recurrent presentations.  Final Clinical Impressions(s) / ED Diagnoses   Final diagnoses:  Suicidal ideation    ED Discharge Orders    None       Tagan Bartram, Jeannett Senior, MD 10/10/17 [phone]

## 2017-10-10 ENCOUNTER — Encounter (HOSPITAL_COMMUNITY): Payer: Self-pay | Admitting: Registered Nurse

## 2017-10-10 LAB — RAPID URINE DRUG SCREEN, HOSP PERFORMED
AMPHETAMINES: NOT DETECTED
BARBITURATES: NOT DETECTED
BENZODIAZEPINES: NOT DETECTED
COCAINE: POSITIVE — AB
Opiates: NOT DETECTED
TETRAHYDROCANNABINOL: NOT DETECTED

## 2017-10-10 LAB — TROPONIN I

## 2017-10-10 MED ORDER — BUSPIRONE HCL 15 MG PO TABS
7.5000 mg | ORAL_TABLET | Freq: Two times a day (BID) | ORAL | Status: DC
  Administered 2017-10-10 – 2017-10-11 (×3): 7.5 mg via ORAL
  Filled 2017-10-10: qty 1.5
  Filled 2017-10-10 (×2): qty 1
  Filled 2017-10-10: qty 1.5
  Filled 2017-10-10 (×2): qty 1
  Filled 2017-10-10: qty 1.5

## 2017-10-10 MED ORDER — ONDANSETRON HCL 4 MG PO TABS: 4.0000 mg | ORAL_TABLET | Freq: Three times a day (TID) | ORAL | Status: DC | PRN

## 2017-10-10 MED ORDER — CITALOPRAM HYDROBROMIDE 20 MG PO TABS
20.0000 mg | ORAL_TABLET | Freq: Every day | ORAL | Status: DC
  Administered 2017-10-10 – 2017-10-11 (×2): 20 mg via ORAL
  Filled 2017-10-10 (×4): qty 1

## 2017-10-10 MED ORDER — TRAZODONE HCL 50 MG PO TABS
50.0000 mg | ORAL_TABLET | Freq: Every day | ORAL | Status: DC
  Administered 2017-10-10: 50 mg via ORAL
  Filled 2017-10-10: qty 1

## 2017-10-10 MED ORDER — SODIUM CHLORIDE 0.9 % IV BOLUS
1000.0000 mL | Freq: Once | INTRAVENOUS | Status: AC
  Administered 2017-10-10: 1000 mL via INTRAVENOUS

## 2017-10-10 MED ORDER — QUETIAPINE FUMARATE 25 MG PO TABS
50.0000 mg | ORAL_TABLET | ORAL | Status: DC
  Administered 2017-10-10 – 2017-10-11 (×2): 50 mg via ORAL
  Filled 2017-10-10 (×2): qty 2

## 2017-10-10 MED ORDER — QUETIAPINE FUMARATE 100 MG PO TABS
200.0000 mg | ORAL_TABLET | Freq: Every day | ORAL | Status: DC
  Administered 2017-10-10 – 2017-10-11 (×2): 200 mg via ORAL
  Filled 2017-10-10 (×2): qty 2

## 2017-10-10 NOTE — ED Notes (Signed)
Per Ala Dach at [facility] pt is to be reevaluated in am,

## 2017-10-10 NOTE — Progress Notes (Signed)
Patient meets criteria for inpatient treatment. There are currently no appropriate beds available at [facility] per [facility]. CSW faxed referrals to the following facilities for review.  [facility], [address], [place], [address], [facility] [place], [place], [facility], [place], [address], [place], [place], [place] [facility], [place], [place], and [facility].  TTS will continue to seek bed placement.     Moss Mc, MSW, LCSW-A, LCAS-A 10/10/2017 3:09 PM
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
## 2017-10-10 NOTE — BH Assessment (Signed)
[facility] Assessment Progress Note   TTS re-assessment:  Patient states that he continues to have suicidal thoughts and he is unable to contract for safety.  Patient states that nothing has changed since last pm.  He states that he is hearing voices to kill himself.  Patient states that he did not sleep well last pm and states that he has not eaten this morning, but states that he does have an appetite.  Patient states that when he completes treatment for his depression that he would like to go to a treatment program for his substance use issues.  Continued Inpatient is recommended. Will continue to seek placement for this patient.

## 2017-10-11 NOTE — Progress Notes (Addendum)
Patient is seen by me and I have consulted by Dr. Lucianne Muss. Patient does not meet inpatient criteria.  Patient is seeking shelter and uses no transportation as an excuse to use cocaine and not follow up with outpatient treatment. Patient states he still his prescriptions from his previous admission to [facility] when he discharged 10-04-17. Staff at [facility] will provide a bus pass for patient to follow up at [facility] today. Patient agrees to this plan and states he will be safe with this plan and denies any SI/HI/AVH, but admits depression. Patient is encouraged to stop using cocaine and be compliant with his medications.

## 2017-10-11 NOTE — ED Notes (Signed)
Per Carney Bern at [facility], pt can be discharged.  Ideally [facility] wanted [facility] to provide a cab ride from [facility] to [facility].  Called [facility] and spoke with Dierdre Highman.  RN says pt can come to [facility] this evening however they have a line of walk ins, they close at 5pm, and pt isn't going to have a ride home from [facility].  Notified Carney Bern at [facility] and was told that pt was cleared by them and it would be up to the provider if they choose to d/c pt home tonight or discharge pt with a ride to [facility] in the morning. Will notify edp.

## 2017-10-12 ENCOUNTER — Emergency Department (HOSPITAL_COMMUNITY)
Admission: EM | Admit: 2017-10-12 | Discharge: 2017-10-13 | Disposition: A | Discharge status: Home / Self Care | Payer: Self-pay | Attending: Emergency Medicine | Admitting: Emergency Medicine

## 2017-10-12 ENCOUNTER — Encounter (HOSPITAL_COMMUNITY): Payer: Self-pay | Admitting: Emergency Medicine

## 2017-10-12 ENCOUNTER — Other Ambulatory Visit: Payer: Self-pay

## 2017-10-12 DIAGNOSIS — Z79899 Other long term (current) drug therapy: Secondary | ICD-10-CM | POA: Insufficient documentation

## 2017-10-12 DIAGNOSIS — N289 Disorder of kidney and ureter, unspecified: Secondary | ICD-10-CM | POA: Insufficient documentation

## 2017-10-12 DIAGNOSIS — E876 Hypokalemia: Secondary | ICD-10-CM | POA: Insufficient documentation

## 2017-10-12 DIAGNOSIS — R45851 Suicidal ideations: Secondary | ICD-10-CM | POA: Insufficient documentation

## 2017-10-12 DIAGNOSIS — F333 Major depressive disorder, recurrent, severe with psychotic symptoms: Secondary | ICD-10-CM | POA: Insufficient documentation

## 2017-10-12 DIAGNOSIS — Z87891 Personal history of nicotine dependence: Secondary | ICD-10-CM | POA: Insufficient documentation

## 2017-10-12 HISTORY — DX: Cocaine dependence with cocaine-induced mood disorder: F14.24

## 2017-10-12 LAB — COMPREHENSIVE METABOLIC PANEL
ALBUMIN: 4.5 g/dL (ref 3.5–5.0)
ALT: 24 U/L (ref 17–63)
AST: 25 U/L (ref 15–41)
Alkaline Phosphatase: 87 U/L (ref 38–126)
Anion gap: 13 (ref 5–15)
BUN: 20 mg/dL (ref 6–20)
CHLORIDE: 102 mmol/L (ref 101–111)
CO2: 22 mmol/L (ref 22–32)
Calcium: 9.3 mg/dL (ref 8.9–10.3)
Creatinine, Ser: 1.46 mg/dL — ABNORMAL HIGH (ref 0.61–1.24)
GFR calc Af Amer: >60 mL/min (ref >60)
GFR, EST NON AFRICAN AMERICAN: 54 mL/min — AB (ref >60)
Glucose, Bld: 130 mg/dL — ABNORMAL HIGH (ref 65–99)
POTASSIUM: 3.3 mmol/L — AB (ref 3.5–5.1)
SODIUM: 137 mmol/L (ref 135–145)
Total Bilirubin: 0.8 mg/dL (ref 0.3–1.2)
Total Protein: 8.1 g/dL (ref 6.5–8.1)

## 2017-10-12 LAB — CBC
HCT: 42.9 % (ref 39.0–52.0)
Hemoglobin: 14.4 g/dL (ref 13.0–17.0)
MCH: 28.2 pg (ref 26.0–34.0)
MCHC: 33.6 g/dL (ref 30.0–36.0)
MCV: 84.1 fL (ref 78.0–100.0)
PLATELETS: 355 10*3/uL (ref 150–400)
RBC: 5.1 MIL/uL (ref 4.22–5.81)
RDW: 13.3 % (ref 11.5–15.5)
WBC: 10.8 10*3/uL — AB (ref 4.0–10.5)

## 2017-10-12 LAB — ETHANOL

## 2017-10-12 LAB — SALICYLATE LEVEL: Salicylate Lvl: <7 mg/dL (ref 2.8–30.0)

## 2017-10-12 LAB — ACETAMINOPHEN LEVEL: Acetaminophen (Tylenol), Serum: <10 ug/mL — ABNORMAL LOW (ref 10–30)

## 2017-10-12 MED ORDER — QUETIAPINE FUMARATE 100 MG PO TABS
200.0000 mg | ORAL_TABLET | Freq: Every day | ORAL | Status: DC
  Administered 2017-10-12: 200 mg via ORAL
  Filled 2017-10-12: qty 2

## 2017-10-12 MED ORDER — ONDANSETRON HCL 4 MG PO TABS: 4.0000 mg | ORAL_TABLET | Freq: Three times a day (TID) | ORAL | Status: DC | PRN

## 2017-10-12 MED ORDER — CITALOPRAM HYDROBROMIDE 20 MG PO TABS
20.0000 mg | ORAL_TABLET | Freq: Every day | ORAL | Status: DC
  Administered 2017-10-13: 20 mg via ORAL
  Filled 2017-10-12 (×3): qty 1

## 2017-10-12 MED ORDER — POTASSIUM CHLORIDE CRYS ER 20 MEQ PO TBCR
40.0000 meq | EXTENDED_RELEASE_TABLET | Freq: Once | ORAL | Status: AC
  Administered 2017-10-12: 40 meq via ORAL
  Filled 2017-10-12: qty 2

## 2017-10-12 MED ORDER — BUSPIRONE HCL 5 MG PO TABS
7.5000 mg | ORAL_TABLET | Freq: Two times a day (BID) | ORAL | Status: DC
  Administered 2017-10-13: 7.5 mg via ORAL
  Filled 2017-10-12 (×4): qty 1.5
  Filled 2017-10-12: qty 2
  Filled 2017-10-12 (×2): qty 1.5

## 2017-10-12 MED ORDER — QUETIAPINE FUMARATE 25 MG PO TABS
50.0000 mg | ORAL_TABLET | ORAL | Status: DC
  Administered 2017-10-13: 50 mg via ORAL
  Filled 2017-10-12: qty 2

## 2017-10-12 MED ORDER — ALUM & MAG HYDROXIDE-SIMETH 200-200-20 MG/5ML PO SUSP: 30.0000 mL | Freq: Four times a day (QID) | ORAL | Status: DC | PRN

## 2017-10-12 MED ORDER — TRAZODONE HCL 50 MG PO TABS
50.0000 mg | ORAL_TABLET | Freq: Every day | ORAL | Status: DC
  Administered 2017-10-12: 50 mg via ORAL
  Filled 2017-10-12: qty 1

## 2017-10-12 NOTE — ED Notes (Signed)
Pt requesting something to eat and drink.

## 2017-10-12 NOTE — ED Notes (Signed)
Pt has been wanded by security- belongings in bag and put in [facility] locker room

## 2017-10-12 NOTE — ED Triage Notes (Signed)
Reports DC from hospital several days ago  Was [facility] to [facility] for cocaine dependence and mental health follow up   Per patient he has not followed as he cannot find a ride

## 2017-10-12 NOTE — ED Provider Notes (Signed)
[facility] EMERGENCY DEPARTMENT Provider Note   CSN: [id number] Arrival date & time: 10/12/17  2154     History   Chief Complaint Chief Complaint  Patient presents with  . Suicidal  . cocaine abuse    HPI Logan [name] is a 52 y.o. male.   The history is provided by the patient.  He has a history of depression, suicidal ideation, cocaine abuse and states that he started getting more depressed today and used a lot of cocaine with intent of overdosing.  He also tried to commit suicide by jumping in front of a car.  He admits to alcohol use but denies any other drug use.  He has had auditory hallucinations telling him to kill himself.  He had been seen for similar problems and referred to [facility], but had not followed up because of inability to get there.  Past Medical History:  Diagnosis Date  . Cocaine dependence with cocaine-induced mood disorder (HCC)   . Depression   . 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 Schizoaffective disorder, bipolar type (HCC) 06/22/2017    Patient Active Problem List   Diagnosis Date Noted  . Substance induced mood disorder (HCC) 09/28/2017  . Severe recurrent major depressive disorder with psychotic features (HCC) 09/15/2017  . Suicidal ideation 03/13/2016  . Cocaine abuse without complication (HCC) 05/23/2011    History reviewed. No pertinent surgical history.      Home Medications    Prior to Admission medications   Medication Sig Start Date End Date Taking? Authorizing Provider  busPIRone (BUSPAR) 7.5 MG tablet Take 1 tablet (7.5 mg total) by mouth 2 (two) times daily. 10/04/17  Yes Withrow, Everardo All, FNP  citalopram (CELEXA) 20 MG tablet Take 1 tablet (20 mg total) by mouth daily. 10/04/17  Yes Withrow, Everardo All, FNP  QUEtiapine (SEROQUEL) 200 MG tablet Take 1 tablet (200 mg total) by mouth at bedtime. 10/04/17  Yes Oneta Rack, NP  QUEtiapine (SEROQUEL) 50 MG tablet Take 1  tablet (50 mg total) by mouth every morning. 10/05/17  Yes Oneta Rack, NP  traZODone (DESYREL) 50 MG tablet Take 1 tablet (50 mg total) by mouth at bedtime. 10/04/17  Yes Oneta Rack, NP    Family History History reviewed. No pertinent family history.  Social History Social History   Tobacco Use  . Smoking status: Former Games developer  . Smokeless tobacco: Never Used  Substance Use Topics  . Alcohol use: Yes  . Drug use: Yes    Types: Cocaine, "Crack" cocaine    Comment: last use 2130 tonight the to ED     Allergies   Asa [aspirin]; Nicotine; Paxil [paroxetine hcl]; and Tylenol [acetaminophen]   Review of Systems Review of Systems  All other systems reviewed and are negative.    Physical Exam Updated Vital Signs BP (!) 124/112 (BP Location: Right Arm)   Pulse (!) 115   Temp 97.9 F (36.6 C) (Oral)   Resp 16   Ht 6' (1.829 m)   Wt 104.3 kg (230 lb)   SpO2 97%   BMI 31.19 kg/m    Physical Exam  Nursing note and vitals reviewed.  52 year old male, resting comfortably and in no acute distress. Vital signs are significant for diastolic hypertension and elevated heart rate. Oxygen saturation is 97%, which is normal. Head is normocephalic and atraumatic. PERRLA, EOMI. Oropharynx is clear. Neck is nontender and supple without adenopathy or JVD. Back is  nontender and there is no CVA tenderness. Lungs are clear without rales, wheezes, or rhonchi. Chest is nontender. Heart has regular rate and rhythm without murmur. Abdomen is soft, flat, nontender without masses or hepatosplenomegaly and peristalsis is normoactive. Extremities have no cyanosis or edema, full range of motion is present. Skin is warm and dry without rash. Neurologic: Mental status is normal, cranial nerves are intact, there are no motor or sensory deficits.  ED Treatments / Results  Labs (all labs ordered are listed, but only abnormal results are displayed) Labs Reviewed  COMPREHENSIVE METABOLIC  PANEL - Abnormal; Notable for the following components:      Result Value   Potassium 3.3 (*)    Glucose, Bld 130 (*)    Creatinine, Ser 1.46 (*)    GFR calc non Af Amer 54 (*)    All other components within normal limits  ACETAMINOPHEN LEVEL - Abnormal; Notable for the following components:   Acetaminophen (Tylenol), Serum <10 (*)    All other components within normal limits  CBC - Abnormal; Notable for the following components:   WBC 10.8 (*)    All other components within normal limits  ETHANOL  SALICYLATE LEVEL  RAPID URINE DRUG SCREEN, HOSP PERFORMED    Procedures Procedures  Medications Ordered in ED Medications  QUEtiapine (SEROQUEL) tablet 200 mg (200 mg Oral Given 10/12/17 2354)  busPIRone (BUSPAR) tablet 7.5 mg (7.5 mg Oral Not Given 10/13/17 0127)  citalopram (CELEXA) tablet 20 mg (has no administration in time range)  QUEtiapine (SEROQUEL) tablet 50 mg (has no administration in time range)  traZODone (DESYREL) tablet 50 mg (50 mg Oral Given 10/12/17 2355)  ondansetron (ZOFRAN) tablet 4 mg (has no administration in time range)  alum & mag hydroxide-simeth (MAALOX/MYLANTA) 200-200-20 MG/5ML suspension 30 mL (has no administration in time range)  potassium chloride SA (K-DUR,KLOR-CON) CR tablet 40 mEq (40 mEq Oral Given 10/12/17 2353)     Initial Impression / Assessment and Plan / ED Course  I have reviewed the triage vital signs and the nursing notes.  Pertinent labs & imaging results that were available during my care of the patient were reviewed by me and considered in my medical decision making (see chart for details).  Major depression with suicidal ideation and weak suicide attempt.  Labs show renal insufficiency which is unchanged from recent values.  Mild hypokalemia is present and he is given oral potassium.  Old records are reviewed confirming he had been referred to [facility].  He has 3 psychiatric admissions since January of this year, 3 ED visits very similar to  today since May 6.  Screening labs are obtained, and TTS consultation will be requested.  TTS consultation is appreciated.  Patient will be held in the ED for psychiatry evaluation in the morning.  Final Clinical Impressions(s) / ED Diagnoses   Final diagnoses:  Severe episode of recurrent major depressive disorder, with psychotic features (HCC)  Suicidal ideation  Renal insufficiency  Hypokalemia    ED Discharge Orders    None      Dione Booze, MD 10/13/17 [phone]

## 2017-10-13 ENCOUNTER — Encounter (HOSPITAL_COMMUNITY): Payer: Self-pay | Admitting: Registered Nurse

## 2017-10-13 LAB — RAPID URINE DRUG SCREEN, HOSP PERFORMED
Amphetamines: NOT DETECTED
BARBITURATES: NOT DETECTED
Benzodiazepines: NOT DETECTED
Cocaine: POSITIVE — AB
Opiates: NOT DETECTED
TETRAHYDROCANNABINOL: NOT DETECTED
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
## 2017-10-13 NOTE — Consult Note (Signed)
  Tele Assessment   Logan [name], 52 y.o., male patient presented to [facility] with complaints of suicidal ideation.  .  Patient seen via telepsych by this provider; chart reviewed and consulted with Dr. Lucianne Muss on 10/13/17.  On chart review patient presents to ED frequently with complaints depression and suicidal ideation.  Patient's presentation to the ED is more likely related to secondary gain related to his homelessness and his unwillingness to follow up with any resources given.  Patient has had several inpatient psychiatric treatments 2019 (06/22/17-12/26/17, 09/14/17-09/20/17, 09/28/17-10/04/17, and last 10/08/17-10/11/17.  After discharge patient go back to ED with the same complaints.  Patient has been seen in ED 4 times in the month of May 5/7, 5/16, 5/19,5 /21/2019.  Patient has reported that he is not going to follow up with referral/resources related to not having transportation.  Resources has been giving that would be within walking distance and patient still refuses to follow up.  On evaluation Brace Welte. reports that he is feeling depressed because he is stuck in [place] and he is unable to get to [facility] because it is to far away and he has no transportation.  Patient asked if he was stuck in [place] and he lived in [place] and was trying to get back Why did he go back to [place] instead of [place] after he was discharge from [facility] in [place]; patient stated "I don't know."  Discussed with patient recent prior inpatient psychiatric treatment and his ED visits and the resources that he was given and asked why he was not following up on resources.  "Nobody ever gave me any.  I want to get into a rehab facility and if I'm inpatient they will send me to rehab."  Explained to patient if he was having suicidal ideation he could not go to a rehab facility related to most being an open bed facility and it would be unsafe because that did not have the staff to watch over suicidal  patients.  Patient then states that he is not suicidal that he thought it would help him get into a rehab facility.  Patient also informed to get into a rehab facility he would have to call because they would want to speak with him for interview; no one else could do interview for him.  Patient also informed that I would be sending him a resource list of long and short term rehab facilities if he wanted to call.  Patient also informed that a bed at [facility] was open and he could call them today for an interview.  Patient denies suicidal/self-harm/homicidal ideation, psychosis, and paranoia.     During evaluation Logan [name] is alert/oriented x 4; calm/cooperative; and mood congruent with affect.  He does not appear to be responding to internal/external stimuli or delusional thoughts.  Patient denied suicidal/self-harm/homicidal ideation, psychosis, and paranoia.  Patient answered question appropriately.  Recommendations:  Outpatient psychiatric services.  Patient sent resources for rehab facilities, outpatient psychiatric services, Information for transportation ([employer]) in [place] that goes to [facility] in [place]; and [facility] contact information  Disposition: No evidence of imminent risk to self or others at present.   Patient does not meet criteria for psychiatric inpatient admission.    Assunta Found, NP

## 2017-10-13 NOTE — ED Notes (Signed)
[facility] called recommending discharge for patient, will send information for rehab facility.

## 2017-10-13 NOTE — Discharge Instructions (Signed)
Take your usual prescriptions as previously directed.  Call your regular medical doctor and your mental health provider today to schedule a follow up appointment within the next 3 days.  Return to the Emergency Department immediately sooner if worsening.

## 2017-10-13 NOTE — Progress Notes (Signed)
Disposition CSW called and spoke to pt's ED Nurse, Marcelino Duster, RN, to advise that patient does not meet criteria and was being recommended for discharge.  Referral information for [facility], a halfway house, and other residential treatment facilities were faxed to the [facility]. Patient has been at various [facility] ED's  Seven times since 06/22/17.  He has had one admission at the [facility] from 09/28/17-10/04/17.    Timmothy Euler. Kaylyn Lim, MSW, LCSWA Disposition Clinical Social Work [phone] (cell) [phone] (office)

## 2017-10-15 ENCOUNTER — Emergency Department (HOSPITAL_COMMUNITY): Payer: Self-pay

## 2017-10-15 ENCOUNTER — Other Ambulatory Visit: Payer: Self-pay

## 2017-10-15 ENCOUNTER — Observation Stay (HOSPITAL_COMMUNITY)
Admission: EM | Admit: 2017-10-15 | Discharge: 2017-10-16 | Disposition: A | Discharge status: Home / Self Care | Payer: Self-pay | Attending: Internal Medicine | Admitting: Internal Medicine

## 2017-10-15 ENCOUNTER — Encounter (HOSPITAL_COMMUNITY): Payer: Self-pay | Admitting: *Deleted

## 2017-10-15 DIAGNOSIS — F14159 Cocaine abuse with cocaine-induced psychotic disorder, unspecified: Secondary | ICD-10-CM | POA: Diagnosis present

## 2017-10-15 DIAGNOSIS — F1910 Other psychoactive substance abuse, uncomplicated: Secondary | ICD-10-CM | POA: Insufficient documentation

## 2017-10-15 DIAGNOSIS — Z87891 Personal history of nicotine dependence: Secondary | ICD-10-CM | POA: Insufficient documentation

## 2017-10-15 DIAGNOSIS — Z79899 Other long term (current) drug therapy: Secondary | ICD-10-CM | POA: Insufficient documentation

## 2017-10-15 DIAGNOSIS — F333 Major depressive disorder, recurrent, severe with psychotic symptoms: Secondary | ICD-10-CM | POA: Insufficient documentation

## 2017-10-15 DIAGNOSIS — R072 Precordial pain: Secondary | ICD-10-CM

## 2017-10-15 DIAGNOSIS — R45851 Suicidal ideations: Secondary | ICD-10-CM | POA: Insufficient documentation

## 2017-10-15 DIAGNOSIS — N179 Acute kidney failure, unspecified: Principal | ICD-10-CM

## 2017-10-15 DIAGNOSIS — F1420 Cocaine dependence, uncomplicated: Secondary | ICD-10-CM | POA: Insufficient documentation

## 2017-10-15 LAB — CBC
HCT: 48.6 % (ref 39.0–52.0)
Hemoglobin: 16.7 g/dL (ref 13.0–17.0)
MCH: 28.3 pg (ref 26.0–34.0)
MCHC: 34.4 g/dL (ref 30.0–36.0)
MCV: 82.2 fL (ref 78.0–100.0)
Platelets: 431 10*3/uL — ABNORMAL HIGH (ref 150–400)
RBC: 5.91 MIL/uL — ABNORMAL HIGH (ref 4.22–5.81)
RDW: 13.4 % (ref 11.5–15.5)
WBC: 14 10*3/uL — AB (ref 4.0–10.5)

## 2017-10-15 LAB — COMPREHENSIVE METABOLIC PANEL
ALK PHOS: 102 U/L (ref 38–126)
ALT: 31 U/L (ref 17–63)
AST: 32 U/L (ref 15–41)
Albumin: 5.2 g/dL — ABNORMAL HIGH (ref 3.5–5.0)
BILIRUBIN TOTAL: 1.3 mg/dL — AB (ref 0.3–1.2)
BUN: 39 mg/dL — ABNORMAL HIGH (ref 6–20)
CALCIUM: 10.5 mg/dL — AB (ref 8.9–10.3)
CO2: 14 mmol/L — AB (ref 22–32)
Chloride: 102 mmol/L (ref 101–111)
Creatinine, Ser: 2.89 mg/dL — ABNORMAL HIGH (ref 0.61–1.24)
GFR calc non Af Amer: 24 mL/min — ABNORMAL LOW (ref >60)
GFR, EST AFRICAN AMERICAN: 27 mL/min — AB (ref >60)
GLUCOSE: 128 mg/dL — AB (ref 65–99)
POTASSIUM: 4.1 mmol/L (ref 3.5–5.1)
SODIUM: 137 mmol/L (ref 135–145)
TOTAL PROTEIN: 9.4 g/dL — AB (ref 6.5–8.1)

## 2017-10-15 LAB — SALICYLATE LEVEL

## 2017-10-15 LAB — ETHANOL: Alcohol, Ethyl (B): <10 mg/dL (ref <10)

## 2017-10-15 LAB — ACETAMINOPHEN LEVEL

## 2017-10-15 LAB — TROPONIN I: Troponin I: <0.03 ng/mL (ref <0.03)

## 2017-10-15 MED ORDER — ONDANSETRON HCL 4 MG/2ML IJ SOLN: 4.0000 mg | Freq: Four times a day (QID) | INTRAMUSCULAR | Status: DC | PRN

## 2017-10-15 MED ORDER — CITALOPRAM HYDROBROMIDE 20 MG PO TABS
20.0000 mg | ORAL_TABLET | Freq: Every day | ORAL | Status: DC
  Administered 2017-10-16: 20 mg via ORAL
  Filled 2017-10-15: qty 1

## 2017-10-15 MED ORDER — ACETAMINOPHEN 325 MG PO TABS: 650.0000 mg | ORAL_TABLET | Freq: Four times a day (QID) | ORAL | Status: DC | PRN

## 2017-10-15 MED ORDER — TRAZODONE HCL 50 MG PO TABS
50.0000 mg | ORAL_TABLET | Freq: Every day | ORAL | Status: DC
  Administered 2017-10-16: 50 mg via ORAL
  Filled 2017-10-15: qty 1

## 2017-10-15 MED ORDER — ONDANSETRON HCL 4 MG PO TABS: 4.0000 mg | ORAL_TABLET | Freq: Four times a day (QID) | ORAL | Status: DC | PRN

## 2017-10-15 MED ORDER — PANTOPRAZOLE SODIUM 40 MG IV SOLR
40.0000 mg | Freq: Once | INTRAVENOUS | Status: AC
  Administered 2017-10-15: 40 mg via INTRAVENOUS
  Filled 2017-10-15: qty 40

## 2017-10-15 MED ORDER — HEPARIN SODIUM (PORCINE) 5000 UNIT/ML IJ SOLN
5000.0000 [IU] | Freq: Three times a day (TID) | INTRAMUSCULAR | Status: DC
  Administered 2017-10-16 (×2): 5000 [IU] via SUBCUTANEOUS
  Filled 2017-10-15 (×2): qty 1

## 2017-10-15 MED ORDER — QUETIAPINE FUMARATE 100 MG PO TABS
200.0000 mg | ORAL_TABLET | Freq: Every day | ORAL | Status: DC
  Administered 2017-10-16: 200 mg via ORAL
  Filled 2017-10-15: qty 8

## 2017-10-15 MED ORDER — QUETIAPINE FUMARATE 25 MG PO TABS
50.0000 mg | ORAL_TABLET | ORAL | Status: DC
  Administered 2017-10-16: 50 mg via ORAL
  Filled 2017-10-15: qty 2

## 2017-10-15 MED ORDER — ACETAMINOPHEN 650 MG RE SUPP: 650.0000 mg | Freq: Four times a day (QID) | RECTAL | Status: DC | PRN

## 2017-10-15 MED ORDER — LACTATED RINGERS IV BOLUS
1000.0000 mL | Freq: Once | INTRAVENOUS | Status: AC
  Administered 2017-10-16: 1000 mL via INTRAVENOUS

## 2017-10-15 MED ORDER — BUSPIRONE HCL 5 MG PO TABS
7.5000 mg | ORAL_TABLET | Freq: Two times a day (BID) | ORAL | Status: DC
  Administered 2017-10-16 (×2): 7.5 mg via ORAL
  Filled 2017-10-15 (×2): qty 2

## 2017-10-15 MED ORDER — LACTATED RINGERS IV SOLN
INTRAVENOUS | Status: AC
  Administered 2017-10-16: 02:00:00 via INTRAVENOUS

## 2017-10-15 NOTE — ED Notes (Signed)
Security called to wand pt  

## 2017-10-15 NOTE — ED Notes (Signed)
Security at bedside and wanded pt.  

## 2017-10-15 NOTE — ED Notes (Signed)
Meal given to patient.  Patient drank 1 cup of water and has tea and coke with his dinner.  Pt calm and cooperative at this time.

## 2017-10-15 NOTE — H&P (Signed)
History and Physical    Logan [name] WUJ:[id number] DOB: 1965-11-05 DOA: 10/15/2017  PCP: Patient, [name]   Patient coming from: Home via EMS  Chief Complaint: Chest pain and fall  HPI: Logan [name] is a 52 y.o. male with medical history significant for cocaine dependence, and major depressive disorder with suicidal ideation who was brought to the emergency department via EMS after he "fell out" at the [employer].  He denies losing any consciousness and was noted to have some chest pain after using cocaine as an attempt to kill himself.  He is currently chest pain-free and is complaining of some heartburn.  He denies any shortness of breath, palpitations, diaphoresis, nausea, vomiting, shortness of breath, or lower extremity edema.  This appears to be his fifth ED visit in the last 1 month.  He states that he has been out in the heat all day and has not been drinking much fluids.   ED Course: Vital signs are stable with some initial noted tachycardia.  He is again chest pain-free.  Initial troponin within normal limits and two-view chest x-ray with no acute findings.  EKG was sinus tachycardia at 101 bpm.  Labs with BUN 29 and creatinine 2.89 with baseline creatinine of 1.2-1.4.  Mild leukocytosis of 14,000 noted.  Review of Systems: All others reviewed and otherwise negative.  Past Medical History:  Diagnosis Date  . Cocaine dependence with cocaine-induced mood disorder (HCC)   . Depression   . 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 Schizoaffective disorder, bipolar type (HCC) 06/22/2017    History reviewed. No pertinent surgical history.   reports that he has quit smoking. He has never used smokeless tobacco. He reports that he drinks alcohol. He reports that he has current or past drug history. Drugs: Cocaine and "Crack" cocaine.  Allergies  Allergen Reactions  . Asa [Aspirin] Hives  . Nicotine    migraine  . Paxil [Paroxetine Hcl]     shaking  . Tylenol [Acetaminophen] Hives    No family history on file.  Prior to Admission medications   Medication Sig Start Date End Date Taking? Authorizing Provider  busPIRone (BUSPAR) 7.5 MG tablet Take 1 tablet (7.5 mg total) by mouth 2 (two) times daily. 10/04/17  Yes Withrow, Everardo All, FNP  citalopram (CELEXA) 20 MG tablet Take 1 tablet (20 mg total) by mouth daily. 10/04/17  Yes Withrow, Everardo All, FNP  QUEtiapine (SEROQUEL) 200 MG tablet Take 1 tablet (200 mg total) by mouth at bedtime. 10/04/17  Yes Oneta Rack, NP  QUEtiapine (SEROQUEL) 50 MG tablet Take 1 tablet (50 mg total) by mouth every morning. 10/05/17  Yes Oneta Rack, NP  traZODone (DESYREL) 50 MG tablet Take 1 tablet (50 mg total) by mouth at bedtime. 10/04/17  Yes Oneta Rack, NP    Physical Exam: Vitals:   10/15/17 2156 10/15/17 2215 10/15/17 2235 10/15/17 2300  BP:   (!) 125/97 (!) 143/96  Pulse: 98 (!) 101 96 95  Resp:   20 20  Temp:      TempSrc:      SpO2: 97% 97% 97% 98%  Weight:      Height:        Constitutional: NAD, calm, comfortable Vitals:   10/15/17 2156 10/15/17 2215 10/15/17 2235 10/15/17 2300  BP:   (!) 125/97 (!) 143/96  Pulse: 98 (!) 101 96 95  Resp:   20 20  Temp:      TempSrc:      SpO2: 97% 97% 97% 98%  Weight:      Height:       Eyes: lids and conjunctivae normal ENMT: Mucous membranes are dry. Neck: normal, supple Respiratory: clear to auscultation bilaterally. Normal respiratory effort. No accessory muscle use.  Cardiovascular: Regular rate and rhythm, no murmurs. No extremity edema. Abdomen: no tenderness, no distention. Bowel sounds positive.  Musculoskeletal:  No joint deformity upper and lower extremities.   Skin: no rashes, lesions, ulcers.  Psychiatric: Normal judgment and insight. Alert and oriented x 3. Normal mood.   Labs on Admission: I have personally reviewed following labs and imaging studies  CBC: Recent Labs    Lab 2017/10/19 2225 10/12/17 2237 10/15/17 1859  WBC 8.4 10.8* 14.0*  HGB 13.3 14.4 16.7  HCT 40.5 42.9 48.6  MCV 84.6 84.1 82.2  PLT 355 355 431*   Basic Metabolic Panel: Recent Labs  Lab Oct 19, 2017 2225 10/12/17 2237 10/15/17 1859  NA 140 137 137  K 3.4* 3.3* 4.1  CL 104 102 102  CO2 23 22 14*  GLUCOSE 99 130* 128*  BUN 16 20 39*  CREATININE 1.45* 1.46* 2.89*  CALCIUM 9.4 9.3 10.5*   GFR: Estimated Creatinine Clearance: 37.8 mL/min (A) (by C-G formula based on SCr of 2.89 mg/dL (H)). Liver Function Tests: Recent Labs  Lab 2017/10/19 2225 10/12/17 2237 10/15/17 1859  AST 27 25 32  ALT ALKPHOS 79 87 102  BILITOT 1.0 0.8 1.3*  PROT 7.6 8.1 9.4*  ALBUMIN 4.2 4.5 5.2*   No results for input(s): LIPASE, AMYLASE in the last 168 hours. No results for input(s): AMMONIA in the last 168 hours. Coagulation Profile: No results for input(s): INR, PROTIME in the last 168 hours. Cardiac Enzymes: Recent Labs  Lab 10-19-17 2225 10/15/17 1859 10/15/17 2143  CKTOTAL 195  --   --   TROPONINI <0.03  <0.03 <0.03 <0.03   BNP (last 3 results) No results for input(s): PROBNP in the last 8760 hours. HbA1C: No results for input(s): HGBA1C in the last 72 hours. CBG: No results for input(s): GLUCAP in the last 168 hours. Lipid Profile: No results for input(s): CHOL, HDL, LDLCALC, TRIG, CHOLHDL, LDLDIRECT in the last 72 hours. Thyroid Function Tests: No results for input(s): TSH, T4TOTAL, FREET4, T3FREE, THYROIDAB in the last 72 hours. Anemia Panel: No results for input(s): VITAMINB12, FOLATE, FERRITIN, TIBC, IRON, RETICCTPCT in the last 72 hours. Urine analysis:    Component Value Date/Time   COLORURINE YELLOW 10/07/2017 1540   APPEARANCEUR CLEAR 10/07/2017 1540   LABSPEC 1.028 10/07/2017 1540   PHURINE 5.0 10/07/2017 1540   GLUCOSEU NEGATIVE 10/07/2017 1540   HGBUR NEGATIVE 10/07/2017 1540   BILIRUBINUR NEGATIVE 10/07/2017 1540   KETONESUR 20 (A) 10/07/2017 1540    PROTEINUR 30 (A) 10/07/2017 1540   UROBILINOGEN 1.0 03/04/2008 1819   NITRITE NEGATIVE 10/07/2017 1540   LEUKOCYTESUR NEGATIVE 10/07/2017 1540    Radiological Exams on Admission: Dg Chest 2 View  Result Date: 10/15/2017 CLINICAL DATA:  52 y/o  M; chest pain.  Cocaine use. EXAM: CHEST - 2 VIEW COMPARISON:  09/28/2017 chest radiograph. FINDINGS: Stable heart size and mediastinal contours are within normal limits. Both lungs are clear. The visualized skeletal structures are unremarkable. IMPRESSION: No acute pulmonary process identified. Electronically Signed   By: Mitzi Hansen M.D.   On: 10/15/2017 19:36    EKG: Independently reviewed. Sinus tach 101bpm.  Assessment/Plan Principal Problem:   AKI (acute kidney injury) (HCC) Active Problems:   Cocaine abuse without complication (HCC)   Suicidal ideation   Severe recurrent major depressive disorder with psychotic features (HCC)    1. AKI-likely prerenal.  IV fluid hydration and monitor with repeat labs and avoid nephrotoxic agents. 2. Recurrent cocaine abuse with features of secondary gain versus possible suicidal ideation.  Patient has had tele-psych assessment on 5/22 and it appears that he is very noncompliant and does not follow up.  He has been given multiple resources, but will not pursue them.  Will arrange for bedside sitter overnight and psych can be called in a.m. for reassessment if needed. 3. Chest pain secondary to cocaine use.  This appears to have fully resolved with no findings of ACS noted thus far.  Will trend troponins and monitor on telemetry. 4. Major depressive disorder.  Continue usual home medications which he has been out of for the last 2 days.   DVT prophylaxis: Heparin Code Status: Full Family Communication: None Disposition Plan:AKI treatment with IVF; DC once improved and pt given psychiatric resources Consults called:None Admission status: Obs, tele   Everet Flagg Hoover Brunette DO [facility] Pager [phone]  If 7PM-7AM, please contact night-coverage [email or web address] Password [facility]  10/15/2017, 11:16 PM

## 2017-10-15 NOTE — ED Triage Notes (Signed)
Pt brought in by [facility] with c/o chest pain that started 40 minutes after he last "hit it". Pt reports using $150 worth of cocaine today. Pt ST on monitor for EMS, HR 125. 20g IV right hand by EMS. Pt is diaphoretic, also c/o SOB and mid to left abdominal pain with vomiting.Pt states, "I was trying to overdose again. I just can't leave the drugs alone. I was doing it to kill myself."

## 2017-10-15 NOTE — ED Notes (Signed)
Pt's belongings removed and placed in psych locker by NT. Pt placed in hospital gown.

## 2017-10-15 NOTE — ED Provider Notes (Signed)
[facility] EMERGENCY DEPARTMENT Provider Note   CSN: [id number] Arrival date & time: 10/15/17  1834     History   Chief Complaint Chief Complaint  Patient presents with  . Chest Pain  . Suicide Attempt    HPI Logan [name] is a 52 y.o. male.  Patient this will be his fifth visit this month for the same thing suicidal/intent and with the attempt to overdose on cocaine to commit suicide.  Patient beginning of the month was admitted to behavioral health and then released.  The other times that he is come in he has not been a candidate for inpatient care.  He was admitted to behavioral health at [facility] at the end of April.  Patient brought in by EMS.  Stated that he done just had this done a bunch of cocaine.  This was around 1730 he developed chest pain and was tachycardic.  Also complained of some mild left lower quadrant abdominal pain.  Patient is voluntary.  Patient has a known cocaine dependence problem.  Also a known history of depression.  Patient was just released by behavioral health after their evaluation did not admission just 2 days ago.     Past Medical History:  Diagnosis Date  . Cocaine dependence with cocaine-induced mood disorder (HCC)   . Depression   . 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 Schizoaffective disorder, bipolar type (HCC) 06/22/2017    Patient Active Problem List   Diagnosis Date Noted  . AKI (acute kidney injury) (HCC) 10/15/2017  . Substance induced mood disorder (HCC) 09/28/2017  . Severe recurrent major depressive disorder with psychotic features (HCC) 09/15/2017  . Suicidal ideation 03/13/2016  . Cocaine abuse without complication (HCC) 05/23/2011    History reviewed. No pertinent surgical history.      Home Medications    Prior to Admission medications   Medication Sig Start Date End Date Taking? Authorizing Provider  busPIRone (BUSPAR) 7.5 MG tablet Take 1 tablet (7.5 mg  total) by mouth 2 (two) times daily. 10/04/17  Yes Withrow, Everardo All, FNP  citalopram (CELEXA) 20 MG tablet Take 1 tablet (20 mg total) by mouth daily. 10/04/17  Yes Withrow, Everardo All, FNP  QUEtiapine (SEROQUEL) 200 MG tablet Take 1 tablet (200 mg total) by mouth at bedtime. 10/04/17  Yes Oneta Rack, NP  QUEtiapine (SEROQUEL) 50 MG tablet Take 1 tablet (50 mg total) by mouth every morning. 10/05/17  Yes Oneta Rack, NP  traZODone (DESYREL) 50 MG tablet Take 1 tablet (50 mg total) by mouth at bedtime. 10/04/17  Yes Oneta Rack, NP    Family History No family history on file.  Social History Social History   Tobacco Use  . Smoking status: Former Games developer  . Smokeless tobacco: Never Used  Substance Use Topics  . Alcohol use: Yes    Comment: "couple of 40's a week" as of 10/15/17  . Drug use: Yes    Types: Cocaine, "Crack" cocaine    Comment: last used today 10/15/17     Allergies   Asa [aspirin]; Nicotine; Paxil [paroxetine hcl]; and Tylenol [acetaminophen]   Review of Systems Review of Systems  Constitutional: Positive for diaphoresis. Negative for fever.  HENT: Negative for congestion.   Respiratory: Negative for shortness of breath.   Cardiovascular: Positive for chest pain and palpitations.  Gastrointestinal: Positive for abdominal pain. Negative for nausea and vomiting.  Genitourinary: Negative for dysuria.  Musculoskeletal: Negative for back pain.  Skin: Negative for rash.  Neurological: Negative for headaches.  Hematological: Does not bruise/bleed easily.  Psychiatric/Behavioral: Positive for dysphoric mood and suicidal ideas. Negative for confusion.     Physical Exam Updated Vital Signs BP (!) 143/96   Pulse 95   Temp (!) 97.3 F (36.3 C) (Oral)   Resp 20   Ht 1.829 m (6')   Wt 104.3 kg (230 lb)   SpO2 98%   BMI 31.19 kg/m   Physical Exam  Constitutional: He is oriented to person, place, and time. He appears well-developed and well-nourished. He appears  distressed.  HENT:  Head: Normocephalic and atraumatic.  Mouth/Throat: Oropharynx is clear and moist.  Eyes: Pupils are equal, round, and reactive to light. Conjunctivae and EOM are normal.  Neck: Normal range of motion. Neck supple.  Cardiovascular: Regular rhythm and normal heart sounds.  Tachycardic  Pulmonary/Chest: Effort normal and breath sounds normal. No respiratory distress.  Abdominal: Soft. Bowel sounds are normal. There is no tenderness.  Musculoskeletal: Normal range of motion. He exhibits no edema.  Neurological: He is alert and oriented to person, place, and time. No cranial nerve deficit or sensory deficit. He exhibits normal muscle tone. Coordination normal.  Skin: Skin is warm. Capillary refill takes less than 2 seconds.  Nursing note and vitals reviewed.    ED Treatments / Results  Labs (all labs ordered are listed, but only abnormal results are displayed) Labs Reviewed  COMPREHENSIVE METABOLIC PANEL - Abnormal; Notable for the following components:      Result Value   CO2 14 (*)    Glucose, Bld 128 (*)    BUN 39 (*)    Creatinine, Ser 2.89 (*)    Calcium 10.5 (*)    Total Protein 9.4 (*)    Albumin 5.2 (*)    Total Bilirubin 1.3 (*)    GFR calc non Af Amer 24 (*)    GFR calc Af Amer 27 (*)    Anion gap >20 (*)    All other components within normal limits  ACETAMINOPHEN LEVEL - Abnormal; Notable for the following components:   Acetaminophen (Tylenol), Serum <10 (*)    All other components within normal limits  CBC - Abnormal; Notable for the following components:   WBC 14.0 (*)    RBC 5.91 (*)    Platelets 431 (*)    All other components within normal limits  ETHANOL  SALICYLATE LEVEL  TROPONIN I  TROPONIN I  RAPID URINE DRUG SCREEN, HOSP PERFORMED    EKG EKG Interpretation  Date/Time:  Friday Oct 15 2017 18:36:54 EDT Ventricular Rate:  101 PR Interval:    QRS Duration: 90 QT Interval:  351 QTC Calculation: 455 R Axis:   60 Text  Interpretation:  Sinus tachycardia Ventricular premature complex Aberrant complex Confirmed by Vanetta Mulders [phone]) on 10/15/2017 7:13:14 PM   Radiology Dg Chest 2 View  Result Date: 10/15/2017 CLINICAL DATA:  52 y/o  M; chest pain.  Cocaine use. EXAM: CHEST - 2 VIEW COMPARISON:  09/28/2017 chest radiograph. FINDINGS: Stable heart size and mediastinal contours are within normal limits. Both lungs are clear. The visualized skeletal structures are unremarkable. IMPRESSION: No acute pulmonary process identified. Electronically Signed   By: Mitzi Hansen M.D.   On: 10/15/2017 19:36    Procedures Procedures (including critical care time)  Medications Ordered in ED Medications  pantoprazole (PROTONIX) injection 40 mg (40 mg Intravenous Given 10/15/17 2309)     Initial Impression / Assessment and Plan /  ED Course  I have reviewed the triage vital signs and the nursing notes.  Pertinent labs & imaging results that were available during my care of the patient were reviewed by me and considered in my medical decision making (see chart for details).    Patient presents with a suicide attempt or suicide thoughts and also attempting to overdose on cocaine.  Arrived tachycardic diaphoretic had chest pain that was upper substernal that has now resolved.  Initial troponin was negative.  Second troponin ordered.  Patient's labs significant for an acute kidney injury.  May very well be prerenal.  Patient been unable to pee to give a urine drug screen.  Patient receiving IV hydration.  Discussed with hospitalist will require admission for this significant change in his kidney function.  Also will need follow-up troponins.  Hospitalist will admit.  No chest pain currently.  Heart rate is now not tachycardic patient improved significantly comfortable patient eating food.   Final Clinical Impressions(s) / ED Diagnoses   Final diagnoses:  AKI (acute kidney injury) (HCC)  Substance abuse  (HCC)  Precordial pain  Suicidal ideation    ED Discharge Orders    None       Vanetta Mulders, MD 10/15/17 2348

## 2017-10-16 DIAGNOSIS — F1410 Cocaine abuse, uncomplicated: Secondary | ICD-10-CM

## 2017-10-16 DIAGNOSIS — R45851 Suicidal ideations: Secondary | ICD-10-CM

## 2017-10-16 DIAGNOSIS — F333 Major depressive disorder, recurrent, severe with psychotic symptoms: Secondary | ICD-10-CM

## 2017-10-16 LAB — BASIC METABOLIC PANEL WITH GFR
Anion gap: 10 (ref 5–15)
BUN: 40 mg/dL — ABNORMAL HIGH (ref 6–20)
CO2: 24 mmol/L (ref 22–32)
Calcium: 9 mg/dL (ref 8.9–10.3)
Chloride: 102 mmol/L (ref 101–111)
Creatinine, Ser: 1.92 mg/dL — ABNORMAL HIGH (ref 0.61–1.24)
GFR calc Af Amer: 45 mL/min — ABNORMAL LOW
GFR calc non Af Amer: 39 mL/min — ABNORMAL LOW
Glucose, Bld: 120 mg/dL — ABNORMAL HIGH (ref 65–99)
Potassium: 3.6 mmol/L (ref 3.5–5.1)
Sodium: 136 mmol/L (ref 135–145)

## 2017-10-16 LAB — RAPID URINE DRUG SCREEN, HOSP PERFORMED
Amphetamines: NOT DETECTED
BENZODIAZEPINES: NOT DETECTED
Barbiturates: NOT DETECTED
COCAINE: POSITIVE — AB
OPIATES: NOT DETECTED
TETRAHYDROCANNABINOL: NOT DETECTED

## 2017-10-16 LAB — CBC
HEMATOCRIT: 43.1 % (ref 39.0–52.0)
Hemoglobin: 14.1 g/dL (ref 13.0–17.0)
MCH: 27.3 pg (ref 26.0–34.0)
MCHC: 32.7 g/dL (ref 30.0–36.0)
MCV: 83.5 fL (ref 78.0–100.0)
PLATELETS: 329 10*3/uL (ref 150–400)
RBC: 5.16 MIL/uL (ref 4.22–5.81)
RDW: 13.7 % (ref 11.5–15.5)
WBC: 10.9 10*3/uL — AB (ref 4.0–10.5)

## 2017-10-16 LAB — TROPONIN I: Troponin I: <0.03 ng/mL

## 2017-10-16 MED ORDER — ENSURE ENLIVE PO LIQD
237.0000 mL | Freq: Two times a day (BID) | ORAL | Status: DC
  Administered 2017-10-16 (×2): 237 mL via ORAL

## 2017-10-16 MED ORDER — LACTATED RINGERS IV BOLUS
1000.0000 mL | Freq: Once | INTRAVENOUS | Status: AC
  Administered 2017-10-16: 1000 mL via INTRAVENOUS

## 2017-10-16 NOTE — Progress Notes (Signed)
Patient is seen by me via tele-psych and I have consulted with Dr. Jola Babinski.  Patient does not meet inpatient criteria and does not discuss any SI/HI/AVH.  Patient has been back to the hospital 3 times in the last week and reports that he is used $150-$200 of cocaine each time.  However patient states that he is not able to come up with the [place] [employer] to pay for transportation back to [place] where he is from or pay the $110 to go to treatment.  Patient is psychiatrically cleared.
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
## 2017-10-16 NOTE — Progress Notes (Signed)
Nutrition Brief Note  Patient identified on the Malnutrition Screening Tool (MST) Report. Patient had reported being unsure if he had lost weight or not.   Wt Readings from Last 15 Encounters:  10/15/17 230 lb (104.3 kg)  10/12/17 230 lb (104.3 kg)  10/09/17 230 lb (104.3 kg)  10/07/17 230 lb (104.3 kg)  09/28/17 230 lb (104.3 kg)  09/27/17 230 lb (104.3 kg)  09/26/17 230 lb (104.3 kg)  09/14/17 216 lb (98 kg)  06/22/17 242 lb (109.8 kg)  03/12/16 250 lb (113.4 kg)  03/12/16 280 lb (127 kg)    Body mass index is 31.19 kg/m. Patient meets criteria for obese  based on current BMI.  RD operating remotely. Patient screened due to +MST, stated unsure if had lost weight. Chart history appears to be comprised entirely of reported weights and RD is unable to discern if there has been recent weight loss.    Patient apparently has long history of psychiatric illness/SI with recurrent suicide attempted. If patient has lost weight, this would be likely etiology.   During all recent hospitalizations, the patient has eaten 100% of ALL his meals.   Current diet order is Regular. Labs and medications reviewed.   No nutrition interventions warranted at this time. If nutrition issues arise, please consult RD.   Christophe Louis RD, LDN, CNSC Clinical Nutrition Available Tues-Sat via Pager: [phone] 10/16/2017 8:28 AM

## 2017-10-16 NOTE — Discharge Summary (Signed)
Physician Discharge Summary  Logan [name] WEX:[id number] DOB: 12-05-1965 DOA: 10/15/2017  PCP: Patient, [name]  Admit date: 10/15/2017 Discharge date: 10/16/2017  Admitted From: Home Disposition: Home  Recommendations for Outpatient Follow-up:  1. Follow up with PCP in 1-2 weeks 2. Please obtain BMP/CBC in one week 3. Patient has received resources for outpatient psychiatric follow-up as well as drug rehab.  Discharge Condition: Stable CODE STATUS: Full code Diet recommendation: Heart Healthy    Brief/Interim Summary: 52 year old male with a history of cocaine abuse as well as major depressive disorder with suicidal ideation, was brought to the emergency room by EMS after he "fell out" at a local grocery store.  Patient did describe having chest pain after using a large amount of cocaine.  He reports that he was trying to hurt himself/end his life with cocaine.  Labs were checked and he was noted to have a significant acute kidney injury.  Patient was admitted to the hospital and started on IV fluids.  Renal function improved from 2.8-1.9.  He was clear responding to IV fluids.  He was bolus further IV fluids and advised to continue with oral hydration.  He did not have any significant rise in troponin.  Since he did complain of suicidal ideations, he was seen by psychiatry who did not feel he met criteria for inpatient treatment.  He was provided resources for outpatient psychiatric follow-up as well as drug rehab.  Patient is feeling significantly improved and appears ready for discharge.  Discharge Diagnoses:  Principal Problem:   AKI (acute kidney injury) ([place]) Active Problems:   Cocaine abuse without complication ([place])   Suicidal ideation   Severe recurrent major depressive disorder with psychotic features [facility])    Discharge Instructions  Discharge Instructions    Diet - low sodium heart healthy   Complete by:  As directed    Increase activity slowly   Complete by:   As directed      Allergies as of 10/16/2017      Reactions   Asa [aspirin] Hives   Nicotine    migraine   Paxil [paroxetine Hcl]    shaking   Tylenol [acetaminophen] Hives      Medication List    TAKE these medications   busPIRone 7.5 MG tablet Commonly known as:  BUSPAR Take 1 tablet (7.5 mg total) by mouth 2 (two) times daily.   citalopram 20 MG tablet Commonly known as:  CELEXA Take 1 tablet (20 mg total) by mouth daily.   QUEtiapine 200 MG tablet Commonly known as:  SEROQUEL Take 1 tablet (200 mg total) by mouth at bedtime.   QUEtiapine 50 MG tablet Commonly known as:  SEROQUEL Take 1 tablet (50 mg total) by mouth every morning.   traZODone 50 MG tablet Commonly known as:  DESYREL Take 1 tablet (50 mg total) by mouth at bedtime.       Allergies  Allergen Reactions  . Asa [Aspirin] Hives  . Nicotine     migraine  . Paxil [Paroxetine Hcl]     shaking  . Tylenol [Acetaminophen] Hives    Consultations:  Psychiatry   Procedures/Studies: Dg Chest 2 View  Result Date: 10/15/2017 CLINICAL DATA:  52 y/o  M; chest pain.  Cocaine use. EXAM: CHEST - 2 VIEW COMPARISON:  09/28/2017 chest radiograph. FINDINGS: Stable heart size and mediastinal contours are within normal limits. Both lungs are clear. The visualized skeletal structures are unremarkable. IMPRESSION: No acute pulmonary process identified. Electronically Signed  By: Kristine Garbe M.D.   On: 10/15/2017 19:36   Dg Chest 2 View  Result Date: 09/28/2017 CLINICAL DATA:  52 year old male with fall. EXAM: CHEST - 2 VIEW COMPARISON:  Chest radiograph dated 02/27/2014 FINDINGS: The heart size and mediastinal contours are within normal limits. Both lungs are clear. The visualized skeletal structures are unremarkable. IMPRESSION: No active cardiopulmonary disease. Electronically Signed   By: Anner Crete M.D.   On: 09/28/2017 01:22   Dg Lumbar Spine Complete  Result Date: 09/26/2017 CLINICAL DATA:   Mid to low back pain since falling from a ladder 4 days ago. Initial encounter. EXAM: LUMBAR SPINE - COMPLETE 4+ VIEW COMPARISON:  Radiographs 06/18/2006. FINDINGS: Five lumbar type vertebral bodies. The alignment is normal. The disc spaces are relatively preserved, although there is mild intervertebral spurring throughout the lumbar spine. There is mild facet hypertrophy. No evidence of acute fracture or pars defect. Small pelvic calcifications are likely phleboliths. IMPRESSION: No evidence of acute lumbar spine injury or malalignment. Mild spondylosis. Electronically Signed   By: Richardean Sale M.D.   On: 09/26/2017 10:05       Subjective: Patient is feeling better.  No chest pain.  No trouble breathing.  Discharge Exam: Vitals:   10/16/17 0459 10/16/17 1305  BP: 105/65 112/82  Pulse: 93 80  Resp: 18 18  Temp: 98 F (36.7 C) 98.3 F (36.8 C)  SpO2: 97% 98%   Vitals:   10/16/17 0100 10/16/17 0200 10/16/17 0459 10/16/17 1305  BP: 130/67 126/60 105/65 112/82  Pulse: 81 85 93 80  Resp: _0 Temp:  98.7 F (37.1 C) 98 F (36.7 C) 98.3 F (36.8 C)  TempSrc:  Oral Oral Oral  SpO2: 98% 98% 97% 98%  Weight:      Height:        General: Pt is alert, awake, not in acute distress Cardiovascular: RRR, S1/S2 +, no rubs, no gallops Respiratory: CTA bilaterally, no wheezing, no rhonchi Abdominal: Soft, NT, ND, bowel sounds + Extremities: no edema, no cyanosis    The results of significant diagnostics from this hospitalization (including imaging, microbiology, ancillary and laboratory) are listed below for reference.     Microbiology: No results found for this or any previous visit (from the past 240 hour(s)).   Labs: BNP (last 3 results) No results for input(s): BNP in the last 8760 hours. Basic Metabolic Panel: Recent Labs  Lab 10/09/17 2225 10/12/17 2237 10/15/17 1859 10/16/17 0617  NA 140 137 137 136  K 3.4* 3.3* 4.1 3.6  CL 104 102 102 102  CO2 23 22 14*  24  GLUCOSE 99 130* 128* 120*  BUN 16 20 39* 40*  CREATININE 1.45* 1.46* 2.89* 1.92*  CALCIUM 9.4 9.3 10.5* 9.0   Liver Function Tests: Recent Labs  Lab 10/09/17 2225 10/12/17 2237 10/15/17 1859  AST 27 25 32  ALT _1 ALKPHOS 79 87 102  BILITOT 1.0 0.8 1.3*  PROT 7.6 8.1 9.4*  ALBUMIN 4.2 4.5 5.2*   No results for input(s): LIPASE, AMYLASE in the last 168 hours. No results for input(s): AMMONIA in the last 168 hours. CBC: Recent Labs  Lab 10/09/17 2225 10/12/17 2237 10/15/17 1859 10/16/17 0617  WBC 8.4 10.8* 14.0* 10.9*  HGB 13.3 14.4 16.7 14.1  HCT 40.5 42.9 48.6 43.1  MCV 84.6 84.1 82.2 83.5  PLT 355 355 431* 329   Cardiac Enzymes: Recent Labs  Lab 10/09/17 2225 10/15/17 1859 10/15/17 2143  10/16/17 0009 10/16/17 0617 10/16/17 1110  CKTOTAL 195  --   --   --   --   --   TROPONINI <0.03  <0.03 <0.03 <0.03 <0.03 <0.03 <0.03   BNP: Invalid input(s): POCBNP CBG: No results for input(s): GLUCAP in the last 168 hours. D-Dimer No results for input(s): DDIMER in the last 72 hours. Hgb A1c No results for input(s): HGBA1C in the last 72 hours. Lipid Profile No results for input(s): CHOL, HDL, LDLCALC, TRIG, CHOLHDL, LDLDIRECT in the last 72 hours. Thyroid function studies No results for input(s): TSH, T4TOTAL, T3FREE, THYROIDAB in the last 72 hours.  Invalid input(s): FREET3 Anemia work up No results for input(s): VITAMINB12, FOLATE, FERRITIN, TIBC, IRON, RETICCTPCT in the last 72 hours. Urinalysis    Component Value Date/Time   COLORURINE YELLOW 10/07/2017 1540   APPEARANCEUR CLEAR 10/07/2017 1540   LABSPEC 1.028 10/07/2017 1540   PHURINE 5.0 10/07/2017 1540   GLUCOSEU NEGATIVE 10/07/2017 1540   HGBUR NEGATIVE 10/07/2017 1540   BILIRUBINUR NEGATIVE 10/07/2017 1540   KETONESUR 20 (A) 10/07/2017 1540   PROTEINUR 30 (A) 10/07/2017 1540   UROBILINOGEN 1.0 03/04/2008 1819   NITRITE NEGATIVE 10/07/2017 1540   LEUKOCYTESUR NEGATIVE 10/07/2017 1540    Sepsis Labs Invalid input(s): PROCALCITONIN,  WBC,  LACTICIDVEN Microbiology No results found for this or any previous visit (from the past 240 hour(s)).   Time coordinating discharge: 35 minutes  SIGNED:   Kathie Dike, MD  [facility] 10/16/2017, 8:04 PM Pager   If 7PM-7AM, please contact night-coverage [email or web address] Password [email or web address]

## 2017-10-17 LAB — HIV ANTIBODY (ROUTINE TESTING W REFLEX): HIV Screen 4th Generation wRfx: NONREACTIVE

## 2017-10-21 ENCOUNTER — Encounter (HOSPITAL_COMMUNITY): Payer: Self-pay

## 2017-10-21 ENCOUNTER — Other Ambulatory Visit: Payer: Self-pay

## 2017-10-21 ENCOUNTER — Emergency Department (HOSPITAL_COMMUNITY): Payer: Self-pay

## 2017-10-21 ENCOUNTER — Emergency Department (HOSPITAL_COMMUNITY)
Admission: EM | Admit: 2017-10-21 | Discharge: 2017-10-22 | Disposition: A | Discharge status: Home / Self Care | Payer: Self-pay | Attending: Emergency Medicine | Admitting: Emergency Medicine

## 2017-10-21 DIAGNOSIS — F1910 Other psychoactive substance abuse, uncomplicated: Secondary | ICD-10-CM

## 2017-10-21 DIAGNOSIS — Z87891 Personal history of nicotine dependence: Secondary | ICD-10-CM | POA: Insufficient documentation

## 2017-10-21 DIAGNOSIS — F332 Major depressive disorder, recurrent severe without psychotic features: Secondary | ICD-10-CM | POA: Insufficient documentation

## 2017-10-21 DIAGNOSIS — F339 Major depressive disorder, recurrent, unspecified: Secondary | ICD-10-CM

## 2017-10-21 DIAGNOSIS — Z79899 Other long term (current) drug therapy: Secondary | ICD-10-CM | POA: Insufficient documentation

## 2017-10-21 DIAGNOSIS — F1420 Cocaine dependence, uncomplicated: Secondary | ICD-10-CM | POA: Insufficient documentation

## 2017-10-21 LAB — CBC
HCT: 47 % (ref 39.0–52.0)
Hemoglobin: 15.6 g/dL (ref 13.0–17.0)
MCH: 27.6 pg (ref 26.0–34.0)
MCHC: 33.2 g/dL (ref 30.0–36.0)
MCV: 83 fL (ref 78.0–100.0)
PLATELETS: 425 10*3/uL — AB (ref 150–400)
RBC: 5.66 MIL/uL (ref 4.22–5.81)
RDW: 13.4 % (ref 11.5–15.5)
WBC: 11.5 10*3/uL — ABNORMAL HIGH (ref 4.0–10.5)

## 2017-10-21 LAB — BASIC METABOLIC PANEL
Anion gap: 12 (ref 5–15)
BUN: 20 mg/dL (ref 6–20)
CHLORIDE: 107 mmol/L (ref 101–111)
CO2: 21 mmol/L — ABNORMAL LOW (ref 22–32)
CREATININE: 1.57 mg/dL — AB (ref 0.61–1.24)
Calcium: 9.8 mg/dL (ref 8.9–10.3)
GFR calc Af Amer: 57 mL/min — ABNORMAL LOW (ref >60)
GFR calc non Af Amer: 49 mL/min — ABNORMAL LOW (ref >60)
GLUCOSE: 124 mg/dL — AB (ref 65–99)
Potassium: 3.9 mmol/L (ref 3.5–5.1)
SODIUM: 140 mmol/L (ref 135–145)

## 2017-10-21 LAB — I-STAT TROPONIN, ED
Troponin i, poc: 0 ng/mL (ref 0.00–0.08)
Troponin i, poc: 0.01 ng/mL (ref 0.00–0.08)

## 2017-10-21 MED ORDER — ALUM & MAG HYDROXIDE-SIMETH 200-200-20 MG/5ML PO SUSP
30.0000 mL | Freq: Once | ORAL | Status: AC
  Administered 2017-10-21: 30 mL via ORAL
  Filled 2017-10-21: qty 30

## 2017-10-21 NOTE — ED Notes (Signed)
Per day shift RN; patient came in for CP. Stated that he has SI thoughts because he did not take his medication yet, today.

## 2017-10-21 NOTE — ED Notes (Signed)
No answer when called for a room. 

## 2017-10-21 NOTE — ED Notes (Signed)
Pt. Given a urinal and made aware that we need a urine sample, pt verbalized understanding.
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
## 2017-10-21 NOTE — ED Notes (Signed)
Pt states "I have smoked crack cocaine everyday for longer than a year." When asked how much Pt states sometimes more than $100 worth. Pt currently denying chest pain at this time.

## 2017-10-21 NOTE — ED Notes (Signed)
ED PA informed on Trop.

## 2017-10-21 NOTE — ED Triage Notes (Signed)
Pt arrived via [facility] states that he walked about 4 miles today and developed sudden onset of central CP with no radiation. Some SOB, ortho positive, some dizziness, and nausea.

## 2017-10-21 NOTE — ED Notes (Signed)
Pt was provided a snack.

## 2017-10-21 NOTE — ED Provider Notes (Signed)
[facility] EMERGENCY DEPARTMENT Provider Note   CSN: [id number] Arrival date & time: 10/21/17  1106     History   Chief Complaint Chief Complaint  Patient presents with  . Chest Pain    HPI Logan Nabor Ryheem Jay. is a 52 y.o. male.  HPI   Patient is a 52 year old male with a history of cocaine abuse, depression with suicidal ideation, schizoaffective disorder who presents to the emergency department for evaluation of chest pain.  Patient reports daily cocaine use, smoked crack cocaine early this morning. States "I am addicted to it." Has tried reaching out to several rehab facilities in the area but has not been able to find transport. He goes on to say that his car broke down this morning and he was walking 4 miles in the heat when he developed gradual onset non-radiating "sharp" substernal chest pain.  Pain lasted for about five hours and resolved about 3pm today.  He had some associated shortness of breath, but this resolved with the chest pain.  He was out in the sun and states that he was sweating, but does not know if this was related to his pain.  He denies lightheadedness, nausea/vomiting.  Denies family history of MI that he is aware of.  Denies tobacco use.  Denies history of exertional chest pain.  No history of DVT/PE, recent surgery or immobilization, active cancer or leg swelling/calf tenderness.  Also reports that he has had suicidal ideation which comes and goes for the past several weeks now. Has attempted suicide in the past through walking in front of a car and by overdosing on cocaine.  He was admitted to behavioral health at [facility] April, 2019.  Denies auditory or visual hallucinations.  Denies any other drug use besides cocaine.   Past Medical History:  Diagnosis Date  . Cocaine dependence with cocaine-induced mood disorder (HCC)   . Depression   . 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  Schizoaffective disorder, bipolar type (HCC) 06/22/2017    Patient Active Problem List   Diagnosis Date Noted  . AKI (acute kidney injury) (HCC) 10/15/2017  . Substance induced mood disorder (HCC) 09/28/2017  . Severe recurrent major depressive disorder with psychotic features (HCC) 09/15/2017  . Suicidal ideation 03/13/2016  . Cocaine abuse without complication (HCC) 05/23/2011    History reviewed. No pertinent surgical history.      Home Medications    Prior to Admission medications   Medication Sig Start Date End Date Taking? Authorizing Provider  busPIRone (BUSPAR) 7.5 MG tablet Take 1 tablet (7.5 mg total) by mouth 2 (two) times daily. 10/04/17  Yes Withrow, Everardo All, FNP  citalopram (CELEXA) 20 MG tablet Take 1 tablet (20 mg total) by mouth daily. 10/04/17  Yes Withrow, Everardo All, FNP  QUEtiapine (SEROQUEL) 50 MG tablet Take 1 tablet (50 mg total) by mouth every morning. Patient taking differently: Take 50-200 mg by mouth See admin instructions.  every morning and  every evening 10/05/17  Yes Oneta Rack, NP  traZODone (DESYREL) 50 MG tablet Take 1 tablet (50 mg total) by mouth at bedtime. 10/04/17  Yes Oneta Rack, NP  QUEtiapine (SEROQUEL) 200 MG tablet Take 1 tablet (200 mg total) by mouth at bedtime. Patient not taking: Reported on 10/21/2017 10/04/17   Oneta Rack, NP    Family History No family history on file.  Social History Social History   Tobacco Use  . Smoking status: Former  Smoker  . Smokeless tobacco: Never Used  Substance Use Topics  . Alcohol use: Yes    Comment: "couple of 40's a week" as of 10/15/17  . Drug use: Yes    Types: Cocaine, "Crack" cocaine    Comment: last used today 10/15/17     Allergies   Asa [aspirin]; Nicotine; Paxil [paroxetine hcl]; and Tylenol [acetaminophen]   Review of Systems Review of Systems  Constitutional: Negative for fever.  HENT: Negative for congestion and sore throat.   Eyes: Negative for visual  disturbance.  Respiratory: Positive for chest tightness (resolved) and shortness of breath (resolved). Negative for cough and wheezing.   Cardiovascular: Positive for chest pain (resolved). Negative for leg swelling.  Gastrointestinal: Negative for abdominal pain, nausea and vomiting.  Genitourinary: Negative for difficulty urinating.  Musculoskeletal: Negative for back pain and gait problem.  Skin: Negative for rash.  Neurological: Positive for light-headedness (resolved). Negative for weakness and numbness.  Psychiatric/Behavioral: Positive for suicidal ideas. Negative for hallucinations.     Physical Exam Updated Vital Signs BP 120/83   Pulse 72   Temp 97.7 F (36.5 C) (Oral)   Resp 16   Ht 6' (1.829 m)   Wt 104.3 kg (230 lb)   SpO2 99%   BMI 31.19 kg/m   Physical Exam  Constitutional: He is oriented to person, place, and time. He appears well-developed and well-nourished. No distress.  Sitting at bedside in no apparent distress, non-toxic appearing.   HENT:  Head: Normocephalic and atraumatic.  Mouth/Throat: Oropharynx is clear and moist. No oropharyngeal exudate.  Eyes: Pupils are equal, round, and reactive to light. Conjunctivae are normal. Right eye exhibits no discharge. Left eye exhibits no discharge.  Neck: Normal range of motion. Neck supple. No JVD present. No tracheal deviation present.  Cardiovascular: Normal rate, regular rhythm and intact distal pulses.  No murmur heard. Pulmonary/Chest: Effort normal and breath sounds normal. No stridor. No respiratory distress. He has no wheezes. He has no rales.  Abdominal: Soft. Bowel sounds are normal.  Musculoskeletal:  No leg swelling or calf tenderness.   Neurological: He is alert and oriented to person, place, and time. Coordination normal.  Skin: Skin is warm and dry. Capillary refill takes less than 2 seconds. He is not diaphoretic.  Psychiatric:  Appears well kept. Voice appropriate speed and volume. Suicidal  ideation present without specific plan. No apparent delusions or hallucinations.   Nursing note and vitals reviewed.    ED Treatments / Results  Labs (all labs ordered are listed, but only abnormal results are displayed) Labs Reviewed  BASIC METABOLIC PANEL - Abnormal; Notable for the following components:      Result Value   CO2 21 (*)    Glucose, Bld 124 (*)    Creatinine, Ser 1.57 (*)    GFR calc non Af Amer 49 (*)    GFR calc Af Amer 57 (*)    All other components within normal limits  CBC - Abnormal; Notable for the following components:   WBC 11.5 (*)    Platelets 425 (*)    All other components within normal limits  I-STAT TROPONIN, ED  I-STAT TROPONIN, ED    EKG EKG Interpretation  Date/Time:  Thursday Oct 21 2017 11:13:45 EDT Ventricular Rate:  107 PR Interval:  118 QRS Duration: 88 QT Interval:  338 QTC Calculation: 451 R Axis:   66 Text Interpretation:  Sinus tachycardia Low voltage QRS Borderline ECG no significant change since Oct 15 2017 Confirmed  by Pricilla Loveless [phone]) on 10/21/2017 8:00:16 PM   Radiology Dg Chest 2 View  Result Date: 10/21/2017 CLINICAL DATA:  Chest pain EXAM: CHEST - 2 VIEW COMPARISON:  10/15/2017 chest radiograph. FINDINGS: Stable cardiomediastinal silhouette with normal heart size. No pneumothorax. No pleural effusion. Lungs appear clear, with no acute consolidative airspace disease and no pulmonary edema. IMPRESSION: No active cardiopulmonary disease. Electronically Signed   By: Delbert Phenix M.D.   On: 10/21/2017 12:20    Procedures Procedures (including critical care time)  Medications Ordered in ED Medications - No data to display   Initial Impression / Assessment and Plan / ED Course  I have reviewed the triage vital signs and the nursing notes.  Pertinent labs & imaging results that were available during my care of the patient were reviewed by me and considered in my medical decision making (see chart for details).       Presents to the emergency department for evaluation of chest pain and suicidal ideation.  Uses cocaine chronically.  Has been seen in the emergency department for suicidal ideation several times over the last month.  Do not suspect ACS given resolution of pain, negative delta troponin, EKG nonischemic. HEART score 2.  Do not suspect PE given no hypoxia, tachypnea, history of DVT/PE, leg swelling, active cancer or recent surgery.  No widening of mediastinum on chest x-ray, pulse deficits, neurological symptoms or new murmur to suggest aortic dissection.  No pneumothorax or pneumomediastinum on chest x-ray.  Patient afebrile and nontoxic, no concern for esophageal perforation.  Reports waxing and waning suicidal ideation for the past month, no specific plan.  Awaiting acetaminophen, ethanol and salicylate level.  Patient placed in psych hold and TTS consulted for further disposition.  Patient is voluntary.  I have no reason to IVC him.  Signout given at shift change to PA [place] up still.  Final Clinical Impressions(s) / ED Diagnoses   Final diagnoses:  None    ED Discharge Orders    None       Lawrence Marseilles 10/21/17 2254    Pricilla Loveless, MD 10/27/17 [phone]

## 2017-10-22 ENCOUNTER — Encounter (HOSPITAL_COMMUNITY): Payer: Self-pay | Admitting: Registered Nurse

## 2017-10-22 LAB — RAPID URINE DRUG SCREEN, HOSP PERFORMED
AMPHETAMINES: NOT DETECTED
Barbiturates: NOT DETECTED
Benzodiazepines: NOT DETECTED
Cocaine: POSITIVE — AB
OPIATES: NOT DETECTED
TETRAHYDROCANNABINOL: NOT DETECTED

## 2017-10-22 LAB — ETHANOL

## 2017-10-22 LAB — SALICYLATE LEVEL: Salicylate Lvl: <7 mg/dL (ref 2.8–30.0)

## 2017-10-22 LAB — ACETAMINOPHEN LEVEL: Acetaminophen (Tylenol), Serum: <10 ug/mL — ABNORMAL LOW (ref 10–30)

## 2017-10-22 NOTE — ED Notes (Signed)
Pts belongings given back to patient ; all belongings accounted for
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
## 2017-10-22 NOTE — ED Notes (Signed)
Regular Diet was ordered for Lunch. 

## 2017-10-22 NOTE — Progress Notes (Addendum)
Faxed resources for outpatient treatment, [facility] per request of [facility]. Patient has been given resources multiple times and is not able to or does not want to follow up.  Logan [name] Kaylyn Lim, MSW, LCSWA Disposition Clinical Social Work [phone] (cell) [phone] (office)

## 2017-10-22 NOTE — ED Provider Notes (Signed)
Patient was seen by Dr. Lucianne Muss and Denice Bors Rankin and cleared for d/c.  He is in NAD and does not pose immediate threat to himself and others.  Pt d/ced with outpt resources.   Gwyneth Sprout, MD 10/22/17 1540

## 2017-10-24 ENCOUNTER — Other Ambulatory Visit: Payer: Self-pay

## 2017-10-24 ENCOUNTER — Emergency Department (HOSPITAL_COMMUNITY)
Admission: EM | Admit: 2017-10-24 | Discharge: 2017-10-25 | Disposition: A | Discharge status: Home / Self Care | Payer: Self-pay | Attending: Emergency Medicine | Admitting: Emergency Medicine

## 2017-10-24 ENCOUNTER — Encounter (HOSPITAL_COMMUNITY): Payer: Self-pay | Admitting: *Deleted

## 2017-10-24 ENCOUNTER — Emergency Department (HOSPITAL_COMMUNITY): Payer: Self-pay

## 2017-10-24 DIAGNOSIS — F1410 Cocaine abuse, uncomplicated: Secondary | ICD-10-CM | POA: Insufficient documentation

## 2017-10-24 DIAGNOSIS — F1994 Other psychoactive substance use, unspecified with psychoactive substance-induced mood disorder: Secondary | ICD-10-CM | POA: Diagnosis present

## 2017-10-24 DIAGNOSIS — Z79899 Other long term (current) drug therapy: Secondary | ICD-10-CM | POA: Insufficient documentation

## 2017-10-24 DIAGNOSIS — F329 Major depressive disorder, single episode, unspecified: Secondary | ICD-10-CM | POA: Insufficient documentation

## 2017-10-24 DIAGNOSIS — R45851 Suicidal ideations: Secondary | ICD-10-CM | POA: Insufficient documentation

## 2017-10-24 DIAGNOSIS — Z87891 Personal history of nicotine dependence: Secondary | ICD-10-CM | POA: Insufficient documentation

## 2017-10-24 DIAGNOSIS — E860 Dehydration: Secondary | ICD-10-CM | POA: Insufficient documentation

## 2017-10-24 HISTORY — DX: Suicide attempt, initial encounter: T14.91XA

## 2017-10-24 LAB — RAPID URINE DRUG SCREEN, HOSP PERFORMED
Amphetamines: NOT DETECTED
Barbiturates: NOT DETECTED
Benzodiazepines: NOT DETECTED
Cocaine: POSITIVE — AB
Opiates: NOT DETECTED
Tetrahydrocannabinol: NOT DETECTED

## 2017-10-24 LAB — HEPATIC FUNCTION PANEL
ALT: 26 U/L (ref 17–63)
AST: 31 U/L (ref 15–41)
Albumin: 5.1 g/dL — ABNORMAL HIGH (ref 3.5–5.0)
Alkaline Phosphatase: 91 U/L (ref 38–126)
BILIRUBIN DIRECT: 0.1 mg/dL (ref 0.1–0.5)
BILIRUBIN INDIRECT: 0.9 mg/dL (ref 0.3–0.9)
Total Bilirubin: 1 mg/dL (ref 0.3–1.2)
Total Protein: 9.1 g/dL — ABNORMAL HIGH (ref 6.5–8.1)

## 2017-10-24 LAB — CBC WITH DIFFERENTIAL/PLATELET
Basophils Absolute: 0 10*3/uL (ref 0.0–0.1)
Basophils Relative: 0 %
Eosinophils Absolute: 0 10*3/uL (ref 0.0–0.7)
Eosinophils Relative: 0 %
HCT: 47.1 % (ref 39.0–52.0)
Hemoglobin: 16 g/dL (ref 13.0–17.0)
Lymphocytes Relative: 20 %
Lymphs Abs: 2.5 10*3/uL (ref 0.7–4.0)
MCH: 28.4 pg (ref 26.0–34.0)
MCHC: 34 g/dL (ref 30.0–36.0)
MCV: 83.5 fL (ref 78.0–100.0)
Monocytes Absolute: 0.8 10*3/uL (ref 0.1–1.0)
Monocytes Relative: 6 %
Neutro Abs: 9.1 10*3/uL — ABNORMAL HIGH (ref 1.7–7.7)
Neutrophils Relative %: 74 %
Platelets: 430 10*3/uL — ABNORMAL HIGH (ref 150–400)
RBC: 5.64 MIL/uL (ref 4.22–5.81)
RDW: 13.4 % (ref 11.5–15.5)
WBC: 12.4 10*3/uL — ABNORMAL HIGH (ref 4.0–10.5)

## 2017-10-24 LAB — LIPASE, BLOOD: LIPASE: 37 U/L (ref 11–51)

## 2017-10-24 LAB — BASIC METABOLIC PANEL
Anion gap: 18 — ABNORMAL HIGH (ref 5–15)
BUN: 27 mg/dL — ABNORMAL HIGH (ref 6–20)
CO2: 21 mmol/L — ABNORMAL LOW (ref 22–32)
Calcium: 10.7 mg/dL — ABNORMAL HIGH (ref 8.9–10.3)
Chloride: 102 mmol/L (ref 101–111)
Creatinine, Ser: 2.26 mg/dL — ABNORMAL HIGH (ref 0.61–1.24)
GFR calc Af Amer: 37 mL/min — ABNORMAL LOW (ref >60)
GFR calc non Af Amer: 32 mL/min — ABNORMAL LOW (ref >60)
Glucose, Bld: 136 mg/dL — ABNORMAL HIGH (ref 65–99)
Potassium: 4 mmol/L (ref 3.5–5.1)
Sodium: 141 mmol/L (ref 135–145)

## 2017-10-24 LAB — ETHANOL: Alcohol, Ethyl (B): <10 mg/dL (ref <10)

## 2017-10-24 LAB — ACETAMINOPHEN LEVEL: Acetaminophen (Tylenol), Serum: <10 ug/mL — ABNORMAL LOW (ref 10–30)

## 2017-10-24 LAB — SALICYLATE LEVEL

## 2017-10-24 MED ORDER — SODIUM CHLORIDE 0.9 % IV BOLUS
1000.0000 mL | Freq: Once | INTRAVENOUS | Status: AC
  Administered 2017-10-24: 1000 mL via INTRAVENOUS

## 2017-10-24 MED ORDER — ONDANSETRON HCL 4 MG PO TABS: 4.0000 mg | ORAL_TABLET | Freq: Three times a day (TID) | ORAL | Status: DC | PRN

## 2017-10-24 MED ORDER — ONDANSETRON HCL 4 MG/2ML IJ SOLN
4.0000 mg | Freq: Once | INTRAMUSCULAR | Status: AC
  Administered 2017-10-24: 4 mg via INTRAVENOUS
  Filled 2017-10-24: qty 2

## 2017-10-24 MED ORDER — FAMOTIDINE 20 MG PO TABS
20.0000 mg | ORAL_TABLET | Freq: Once | ORAL | Status: AC
  Administered 2017-10-24: 20 mg via ORAL
  Filled 2017-10-24: qty 1

## 2017-10-24 MED ORDER — ALUM & MAG HYDROXIDE-SIMETH 200-200-20 MG/5ML PO SUSP: 30.0000 mL | Freq: Four times a day (QID) | ORAL | Status: DC | PRN

## 2017-10-24 NOTE — ED Provider Notes (Addendum)
[facility] EMERGENCY DEPARTMENT Provider Note   CSN: [id number] Arrival date & time: 10/24/17  1813     History   Chief Complaint Chief Complaint  Patient presents with  . V70.1    HPI Logan [name] is a 52 y.o. male who is well-known to this emergency department and has a history of cocaine dependence, depression, schizoaffective disorder and suicidal ideation, also recently admitted to the hospital for acute kidney injury, presumptively due to dehydration.  He presents today after an all day crack binge motivated by desire to kill himself.  He reports severe depression, also reports reduced appetite, has had no oral intake today, but states he smoked $200 worth of crack since 11 AM today.  He denies any other substance abuse including alcohol.  He denies any complaint of pain at this time including chest pain but has been diaphoretic and endorses shortness of breath.  The history is provided by the patient.    Past Medical History:  Diagnosis Date  . Cocaine dependence with cocaine-induced mood disorder (HCC)   . Depression   . 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 Schizoaffective disorder, bipolar type (HCC) 06/22/2017  . Suicide attempt [facility])     Patient Active Problem List   Diagnosis Date Noted  . AKI (acute kidney injury) (HCC) 10/15/2017  . Substance induced mood disorder (HCC) 09/28/2017  . Severe recurrent major depressive disorder with psychotic features (HCC) 09/15/2017  . Suicidal ideation 03/13/2016  . Cocaine abuse without complication (HCC) 05/23/2011    History reviewed. No pertinent surgical history.      Home Medications    Prior to Admission medications   Medication Sig Start Date End Date Taking? Authorizing Provider  busPIRone (BUSPAR) 7.5 MG tablet Take 1 tablet (7.5 mg total) by mouth 2 (two) times daily. 10/04/17  Yes Withrow, Everardo All, FNP  citalopram (CELEXA) 20 MG tablet Take 1 tablet  (20 mg total) by mouth daily. 10/04/17  Yes Withrow, Everardo All, FNP  QUEtiapine (SEROQUEL) 200 MG tablet Take 1 tablet (200 mg total) by mouth at bedtime. 10/04/17  Yes Oneta Rack, NP  traZODone (DESYREL) 50 MG tablet Take 1 tablet (50 mg total) by mouth at bedtime. 10/04/17  Yes Oneta Rack, NP    Family History History reviewed. No pertinent family history.  Social History Social History   Tobacco Use  . Smoking status: Former Games developer  . Smokeless tobacco: Never Used  Substance Use Topics  . Alcohol use: Yes    Comment: "couple of 40's a week" as of 10/15/17  . Drug use: Yes    Types: Cocaine, "Crack" cocaine    Comment: smoked all day today 10/24/17     Allergies   Asa [aspirin]; Nicotine; Paxil [paroxetine hcl]; and Tylenol [acetaminophen]   Review of Systems Review of Systems  Constitutional: Positive for appetite change and diaphoresis. Negative for chills and fever.  HENT: Negative.  Negative for congestion.   Eyes: Negative.   Respiratory: Positive for shortness of breath. Negative for chest tightness.   Cardiovascular: Negative for chest pain and palpitations.  Gastrointestinal: Negative for abdominal pain, nausea and vomiting.  Genitourinary: Negative.   Musculoskeletal: Negative for arthralgias and neck pain.  Skin: Negative.  Negative for rash and wound.  Neurological: Negative for dizziness, weakness, light-headedness, numbness and headaches.  Psychiatric/Behavioral: Positive for dysphoric mood, self-injury and suicidal ideas.     Physical Exam Updated Vital Signs BP 113/70  Pulse 76   Temp 97.9 F (36.6 C) (Oral)   Resp (!) 21   Ht 6' (1.829 m)   Wt 104.3 kg (230 lb)   SpO2 100%   BMI 31.19 kg/m   Physical Exam  Constitutional: He appears well-developed and well-nourished. No distress.  HENT:  Head: Normocephalic and atraumatic.  Eyes: Conjunctivae are normal.  Neck: Normal range of motion.  Cardiovascular: Normal rate, regular rhythm,  normal heart sounds and intact distal pulses.  Pulmonary/Chest: Effort normal and breath sounds normal. He has no wheezes. He exhibits no tenderness.  Abdominal: Soft. Bowel sounds are normal. There is no tenderness. There is no guarding.  Musculoskeletal: Normal range of motion.  Neurological: He is alert.  Skin: Skin is warm and dry.  Psychiatric: He expresses suicidal ideation. He expresses suicidal plans.  Nursing note and vitals reviewed.    ED Treatments / Results  Labs (all labs ordered are listed, but only abnormal results are displayed) Labs Reviewed  CBC WITH DIFFERENTIAL/PLATELET - Abnormal; Notable for the following components:      Result Value   WBC 12.4 (*)    Platelets 430 (*)    Neutro Abs 9.1 (*)    All other components within normal limits  BASIC METABOLIC PANEL - Abnormal; Notable for the following components:   CO2 21 (*)    Glucose, Bld 136 (*)    BUN 27 (*)    Creatinine, Ser 2.26 (*)    Calcium 10.7 (*)    GFR calc non Af Amer 32 (*)    GFR calc Af Amer 37 (*)    Anion gap 18 (*)    All other components within normal limits  RAPID URINE DRUG SCREEN, HOSP PERFORMED - Abnormal; Notable for the following components:   Cocaine POSITIVE (*)    All other components within normal limits  HEPATIC FUNCTION PANEL - Abnormal; Notable for the following components:   Total Protein 9.1 (*)    Albumin 5.1 (*)    All other components within normal limits  ACETAMINOPHEN LEVEL - Abnormal; Notable for the following components:   Acetaminophen (Tylenol), Serum <10 (*)    All other components within normal limits  ETHANOL  LIPASE, BLOOD  SALICYLATE LEVEL    EKG EKG Interpretation  Date/Time:  Sunday October 24 2017 19:53:46 EDT Ventricular Rate:  89 PR Interval:    QRS Duration: 98 QT Interval:  375 QTC Calculation: 457 R Axis:   56 Text Interpretation:  Sinus rhythm Low voltage, precordial leads Consider inferior infarct Confirmed by Rolland PorterJames, Mark ([id number]) on  10/24/2017 8:01:39 PM   Radiology Dg Chest 2 View  Result Date: 10/24/2017 CLINICAL DATA:  52 y/o  M; diaphoretic, cocaine use. EXAM: CHEST - 2 VIEW COMPARISON:  10/21/2017 is chest radiograph FINDINGS: Stable heart size and mediastinal contours are within normal limits. Both lungs are clear. The visualized skeletal structures are unremarkable. IMPRESSION: No active cardiopulmonary disease. Electronically Signed   By: Mitzi HansenLance  Furusawa-Stratton M.D.   On: 10/24/2017 19:40    Procedures Procedures (including critical care time)  Medications Ordered in ED Medications  sodium chloride 0.9 % bolus 1,000 mL (0 mLs Intravenous Stopped 10/24/17 2214)  famotidine (PEPCID) tablet 20 mg (20 mg Oral Given 10/24/17 2102)  ondansetron (ZOFRAN) injection 4 mg (4 mg Intravenous Given 10/24/17 2102)  sodium chloride 0.9 % bolus 1,000 mL (0 mLs Intravenous Stopped 10/24/17 2312)     Initial Impression / Assessment and Plan / ED Course  I  have reviewed the triage vital signs and the nursing notes.  Pertinent labs & imaging results that were available during my care of the patient were reviewed by me and considered in my medical decision making (see chart for details).     Review of labs, imaging and EKG.  Patient does have renal insufficiency, which appears to be secondary to dehydration.  He will be given IV fluids.  We will also obtain TTS consult for further evaluation of his suicidal ideation.  Patient is here voluntarily.  He has passed a p.o. challenge while here.   Pending TTS consult at this time.  11:33 PM Call from [place] with TTS.  Pt was evaluated and suspect there may be malingering given the frequency of his pattern of crack abuse as suicidal gesture, but recommend overnight stay for re-eval in am by psychiatry.   He was placed in psych holding. Pt is here voluntarily.  Final Clinical Impressions(s) / ED Diagnoses   Final diagnoses:  Suicidal ideation  Cocaine abuse [facility])  Dehydration     ED Discharge Orders    None       Victoriano Lain 10/24/17 2204    Burgess Amor, PA-C 10/24/17 2333    Raeford Razor, MD 10/27/17 1257

## 2017-10-24 NOTE — ED Notes (Signed)
Multiple ED visits to various EDs Stated tonight that he is taking ODs in attempt to kill himself Has been referred to area mental health without any visit from pt nor has he intention to go per his report due to distance and his inability to get there  Multiple assessments, pt returns with , again, stated intention to kill self with OD

## 2017-10-24 NOTE — ED Notes (Signed)
Pt c/o n/v, indigestion, Raynelle FanningJulie PA notified, additional orders given,
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
## 2017-10-24 NOTE — ED Triage Notes (Signed)
Pt diaphoretic, pt walked here, smoked $200 cocaine all day today trying to kill self.  Pt states he is tired to living and states he has been trying to kill self for awhile and has not follow up with psych

## 2017-10-25 ENCOUNTER — Emergency Department (HOSPITAL_COMMUNITY)
Admission: EM | Admit: 2017-10-25 | Discharge: 2017-10-26 | Disposition: A | Discharge status: Home / Self Care | Payer: Self-pay | Attending: Emergency Medicine | Admitting: Emergency Medicine

## 2017-10-25 ENCOUNTER — Encounter (HOSPITAL_COMMUNITY): Payer: Self-pay | Admitting: Emergency Medicine

## 2017-10-25 ENCOUNTER — Other Ambulatory Visit: Payer: Self-pay

## 2017-10-25 DIAGNOSIS — Z79899 Other long term (current) drug therapy: Secondary | ICD-10-CM | POA: Insufficient documentation

## 2017-10-25 DIAGNOSIS — F1910 Other psychoactive substance abuse, uncomplicated: Secondary | ICD-10-CM | POA: Insufficient documentation

## 2017-10-25 DIAGNOSIS — Z87891 Personal history of nicotine dependence: Secondary | ICD-10-CM | POA: Insufficient documentation

## 2017-10-25 DIAGNOSIS — F1420 Cocaine dependence, uncomplicated: Secondary | ICD-10-CM | POA: Insufficient documentation

## 2017-10-25 DIAGNOSIS — F1994 Other psychoactive substance use, unspecified with psychoactive substance-induced mood disorder: Secondary | ICD-10-CM

## 2017-10-25 DIAGNOSIS — R45851 Suicidal ideations: Secondary | ICD-10-CM

## 2017-10-25 LAB — BASIC METABOLIC PANEL
ANION GAP: 6 (ref 5–15)
BUN: 26 mg/dL — ABNORMAL HIGH (ref 6–20)
CHLORIDE: 105 mmol/L (ref 101–111)
CO2: 26 mmol/L (ref 22–32)
Calcium: 8.7 mg/dL — ABNORMAL LOW (ref 8.9–10.3)
Creatinine, Ser: 1.76 mg/dL — ABNORMAL HIGH (ref 0.61–1.24)
GFR, EST AFRICAN AMERICAN: 50 mL/min — AB (ref >60)
GFR, EST NON AFRICAN AMERICAN: 43 mL/min — AB (ref >60)
Glucose, Bld: 147 mg/dL — ABNORMAL HIGH (ref 65–99)
POTASSIUM: 4.2 mmol/L (ref 3.5–5.1)
Sodium: 137 mmol/L (ref 135–145)

## 2017-10-25 NOTE — ED Notes (Signed)
Patient transported to X-ray 

## 2017-10-25 NOTE — ED Notes (Signed)
Spoke with Logan Hudson w/[facility], states pt is recommended for d/c, edp notified

## 2017-10-25 NOTE — ED Notes (Signed)
PT ambulated to bathroom
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
## 2017-10-25 NOTE — Progress Notes (Signed)
[facility], Fransisca KaufmannLaura Davis, PMHNP.  Pt no longer meets criteria and discharge is recommended.  [facility] Nurse, Lora Havenshristina K, RN, notified and will advise EDP.  Timmothy EulerJean T. Kaylyn LimSutter, MSW, LCSWA Disposition Clinical Social Work [phone] (cell) [phone] (office)

## 2017-10-25 NOTE — ED Notes (Signed)
Received report on pt, pt given meal tray, sitter remains at bedside,

## 2017-10-25 NOTE — ED Notes (Signed)
Breakfast meal tray arrived. Pt sleeping. Meal tray placed at bedside.

## 2017-10-25 NOTE — ED Notes (Signed)
TTS re-eval in progress.  

## 2017-10-25 NOTE — ED Notes (Signed)
Pt resting with eyes closed, resp even and non labored, sitter remains at bedside,  

## 2017-10-26 NOTE — Discharge Instructions (Addendum)
Substance Abuse Treatment Programs ° °Intensive Outpatient Programs °[facility]     °[address]      °[place], [place]                   °[phone]      ° °[facility] °[address] °[place], [place] °[phone] ° °[facility]     °(Inpatient and outpatient)     °[address]           °[phone]   ° °[facility] °[phone] (Suboxone and Methadone) ° °[address]      °[place], [place] [address]      °[phone]      ° °[address] °[place], [place] °[phone] ° °[facility] (Outpatient/Inpatient, Chemical)    °(insurance only) [phone]      °       °[facility] (Groups & Residential) °[place], [place] °[phone] ° °   °[facility]     °[address]     °[place], [place]      °[phone]      ° °[facility] (for caregivers and family) °[address] °[place], [place] °[phone] ° ° ° ° ° °Residential Treatment Programs °[facility]      °[address], [place], [place] [address]  °[phone]      ° °[facility] °[address] [place], [place] [address] °[phone] ° °[facility]        °[phone]      ° °[facility] °[phone] ° °[facility]             °[address]                                         °[place], [place]                                                °[phone] or [phone]                              ° °[facility] [place] °[address] °[place], [address] °[phone] ° °[facility]    °[address]      °[place], [place]     °[phone]      ° °[facility] °[address] °[place], [place] °[phone] ° °[facility]   °[address]     °[place], [place] [address]     °[phone]      °Admissions: 8am-3pm M-F ° °[facility] °[address] °[place],  [place] °[phone] ° °[facility]: Residential Program (90 Days)   °[place], [place]      °[phone] or [phone]    ° °[facility] °[place], [place] °(Walk in Hours over the weekend or by referral) ° °[facility] °[address], [place], [place] [address] °([phone] ° °Crisis Mobile: [facility]:  1-877-626-1772 (for crisis response 24 hours a day) °[facility] Hotline:      1-800-256-2452 °Outpatient Psychiatry and Counseling ° °[facility]: Mobile Crisis   Management 24 hours:  1-877-626-1772 ° °[facility] sliding scale fee and walk in schedule: M-F 8am-12pm/1pm-3pm °[address]  °[place], [place] [address] °[phone] ° °[facility] °[address] °[place], [place] [address] °[phone] ° °[facility] ([facility])- new patient walk-in appointments available Monday - Friday 8am -3pm.          °[address] °[place], [place] [address] °336-676-6840 or crisis line- [phone] ° °[facility]/ Intensive Outpatient Therapy Program °[address] °[place], [place] [address] °336-832-9804 ° °[facility]                  °Crisis Services      °[phone]      °[address]     °[place], [place] [address]                ° °[facility]   °[facility] °[phone] °[address] °[place], [place] [address] ° ° °[facility]          °[address]  °[place], [place] [address]       °[phone] ° °[facility] °[address] °[place], [place] [address] °336-292-1510 ° °[facility]    °[address]    °[place], [place] [address]     °[phone]      ° °Parish McKinney, MD     °[address]     °[place] [address]     °[phone]     °  °[facility] °[address] °[place] [address] ° °[facility]     °[address]     °[place], [place]      °[phone]      ° °[facility] °Shamsher Ahluwalia, MD °[address] °[place], [place] [address] °[phone] ° °[facility]     °[address]     °[place], [place] [address]     °336-274-1237      ° °[facility] °[address] °[place], [place] [address] °336-854-4450 °Resources for Temporary Residential Assistance/Crisis Centers ° °[facility] °[facility] °M-F 8am-3pm   °[address] [place], [place] [address]   336-332-0824 °Services include: laundry, barbering, support groups, case management, phone  & computer access, showers, AA/NA mtgs, mental health/substance abuse nurse, job skills class, disability information, VA assistance, spiritual classes, etc.  ° °HOMELESS SHELTERS ° °[facility]     °[facility]   °[address], [place]     °[phone]       °       °[facility] (women and children)       °[address] °[place], [place] [address] °336-275-0820 °[email or web address] for application and process °Application Required ° °[facility]   °[address]    °[place] [address]     °[phone]       °             °[facility] °[address] °[place], [place] [address] °[phone] °336-235-0363(schedule application appt.) °Application Required ° °[facility] (women only)    °[address]     °[place], [place] [address]     °336-884-1039      °  Intake starts 6pm daily °Need valid ID, SSC, & Police report °[facility] [place] °[address] °[place], [place] °336-881-5420 °Application Required ° °[facility] (men only)     °[address]      °[place], [place]     °[phone]      ° °[facility] °(Pregnant women only) °[address] °[place], [place] °336-275-0206 ° °[facility]      °[address]      °[place], [place] [address]     °[phone]      °       °[facility] °[address] °[place], [place] °336-723-1848 °90 day commitment/SA/Application process ° °[facility](men only)     °[address]     °[place], [place]     °[phone]       °Check-in at 7pm     °       °[facility] °[address] °[place], [place] [address] °336-248-6684 °Men/Women/Women and Children must be there by 7 pm ° °[facility] °[place],  °336-722-8721                ° °

## 2017-10-26 NOTE — ED Provider Notes (Signed)
[facility] EMERGENCY DEPARTMENT Provider Note   CSN: [id number] Arrival date & time: 10/25/17  2258     History   Chief Complaint Chief Complaint  Patient presents with  . V70.1    HPI Logan CliffJames Oliver Farve Jr. is a 52 y.o. male.  The history is provided by the patient.  Mental Health Problem  Degree of incapacity (severity):  Moderate Onset quality:  Gradual Timing:  Constant Chronicity:  Chronic Context: drug abuse   Relieved by:  Nothing Worsened by:  Nothing Associated symptoms: no chest pain    Patient with history of depression, cocaine abuse presents with continued depression.  He reports that he was smoking crack in an attempt to harm himself.  Other than the drug abuse, he has not tried any other means of suicide this is similar to a recent ER visit.  He denies any other complaints at this time. He reports he has found it difficult to follow-up as an outpatient Past Medical History:  Diagnosis Date  . Cocaine dependence with cocaine-induced mood disorder (HCC)   . Depression   . 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 Schizoaffective disorder, bipolar type (HCC) 06/22/2017  . Suicide attempt [facility](HCC)     Patient Active Problem List   Diagnosis Date Noted  . AKI (acute kidney injury) (HCC) 10/15/2017  . Substance induced mood disorder (HCC) 09/28/2017  . Severe recurrent major depressive disorder with psychotic features (HCC) 09/15/2017  . Suicidal ideation 03/13/2016  . Cocaine abuse without complication (HCC) 05/23/2011    History reviewed. No pertinent surgical history.      Home Medications    Prior to Admission medications   Medication Sig Start Date End Date Taking? Authorizing Provider  busPIRone (BUSPAR) 7.5 MG tablet Take 1 tablet (7.5 mg total) by mouth 2 (two) times daily. 10/04/17   Withrow, Everardo AllJohn C, FNP  citalopram (CELEXA) 20 MG tablet Take 1 tablet (20 mg total) by mouth daily. 10/04/17    Withrow, Everardo AllJohn C, FNP  QUEtiapine (SEROQUEL) 200 MG tablet Take 1 tablet (200 mg total) by mouth at bedtime. 10/04/17   Oneta RackLewis, Tanika N, NP  traZODone (DESYREL) 50 MG tablet Take 1 tablet (50 mg total) by mouth at bedtime. 10/04/17   Oneta RackLewis, Tanika N, NP    Family History No family history on file.  Social History Social History   Tobacco Use  . Smoking status: Former Games developermoker  . Smokeless tobacco: Never Used  Substance Use Topics  . Alcohol use: Yes    Comment: "couple of 40's a week" as of 10/15/17  . Drug use: Yes    Types: Cocaine, "Crack" cocaine    Comment: today      Allergies   Asa [aspirin]; Nicotine; Paxil [paroxetine hcl]; and Tylenol [acetaminophen]   Review of Systems Review of Systems  Cardiovascular: Negative for chest pain.  Psychiatric/Behavioral: Positive for dysphoric mood.     Physical Exam Updated Vital Signs BP (!) 128/93 (BP Location: Right Arm)   Pulse (!) 116   Temp 97.7 F (36.5 C) (Oral)   Resp 20   Ht 1.829 m (6')   Wt 104.3 kg (230 lb)   SpO2 98%   BMI 31.19 kg/m   Physical Exam  CONSTITUTIONAL: Disheveled and sleeping on my arrival to bedside HEAD: Normocephalic/atraumatic EYES: EOMI ENMT: Mucous membranes moist NECK: supple no meningeal signs CV: S1/S2 noted, no murmurs/rubs/gallops noted LUNGS: Lungs are clear to auscultation bilaterally, no apparent distress ABDOMEN: soft,  nontender NEURO: Pt is awake/alert/appropriate, moves all extremitiesx4.    EXTREMITIES: full ROM SKIN: warm, color normal PSYCH: no abnormalities of mood noted, alert and oriented to situation  ED Treatments / Results  Labs (all labs ordered are listed, but only abnormal results are displayed) Labs Reviewed - No data to display  EKG EKG Interpretation  Date/Time:  Monday October 25 2017 23:05:13 EDT Ventricular Rate:  114 PR Interval:  124 QRS Duration: 86 QT Interval:  338 QTC Calculation: 465 R Axis:   76 Text Interpretation:  Sinus tachycardia  Cannot rule out Anterior infarct , age undetermined Abnormal ECG Interpretation limited secondary to artifact No significant change since last tracing Confirmed by Zadie Rhine ([id number]) on 10/25/2017 11:11:24 PM   Radiology Dg Chest 2 View  Result Date: 10/24/2017 CLINICAL DATA:  52 y/o  M; diaphoretic, cocaine use. EXAM: CHEST - 2 VIEW COMPARISON:  10/21/2017 is chest radiograph FINDINGS: Stable heart size and mediastinal contours are within normal limits. Both lungs are clear. The visualized skeletal structures are unremarkable. IMPRESSION: No active cardiopulmonary disease. Electronically Signed   By: Mitzi Hansen M.D.   On: 10/24/2017 19:40    Procedures Procedures (including critical care time)  Medications Ordered in ED Medications - No data to display   Initial Impression / Assessment and Plan / ED Course  I have reviewed the triage vital signs and the nursing notes.       Patient with multiple ED visits for similar complaints.  He was just seen on June 3 for similar complaint and was cleared by psychiatry for discharge.  He left the ER and smoked crack and came right back.  He has no other plan for suicide  He slept in the ER for several hours without any issue.  He is awake alert does not appear psychotic.  I have given him resources on follow-up as an outpatient for substance abuse.  At this time I do not feel he is a clear threat to himself or others.  Final Clinical Impressions(s) / ED Diagnoses   Final diagnoses:  Substance abuse [facility])    ED Discharge Orders    None       Zadie Rhine, MD 10/26/17 [phone]

## 2017-10-26 NOTE — ED Notes (Signed)
Pt given clothes and directed to bathroom to change, left without receiving discharge paperwork

## 2018-08-05 ENCOUNTER — Encounter (HOSPITAL_COMMUNITY): Payer: Self-pay

## 2018-08-05 ENCOUNTER — Emergency Department (HOSPITAL_COMMUNITY)
Admission: EM | Admit: 2018-08-05 | Discharge: 2018-08-06 | Disposition: A | Discharge status: Other Acute Inpatient Hospital | Payer: Self-pay | Attending: Emergency Medicine | Admitting: Emergency Medicine

## 2018-08-05 ENCOUNTER — Other Ambulatory Visit: Payer: Self-pay

## 2018-08-05 DIAGNOSIS — R45851 Suicidal ideations: Secondary | ICD-10-CM

## 2018-08-05 DIAGNOSIS — F1910 Other psychoactive substance abuse, uncomplicated: Secondary | ICD-10-CM

## 2018-08-05 DIAGNOSIS — Z87891 Personal history of nicotine dependence: Secondary | ICD-10-CM | POA: Insufficient documentation

## 2018-08-05 DIAGNOSIS — F1424 Cocaine dependence with cocaine-induced mood disorder: Secondary | ICD-10-CM | POA: Insufficient documentation

## 2018-08-05 DIAGNOSIS — Z79899 Other long term (current) drug therapy: Secondary | ICD-10-CM | POA: Insufficient documentation

## 2018-08-05 LAB — COMPREHENSIVE METABOLIC PANEL WITH GFR
ALT: 32 U/L (ref 0–44)
AST: 43 U/L — ABNORMAL HIGH (ref 15–41)
Albumin: 4 g/dL (ref 3.5–5.0)
Alkaline Phosphatase: 79 U/L (ref 38–126)
Anion gap: 11 (ref 5–15)
BUN: 18 mg/dL (ref 6–20)
CO2: 23 mmol/L (ref 22–32)
Calcium: 9.1 mg/dL (ref 8.9–10.3)
Chloride: 104 mmol/L (ref 98–111)
Creatinine, Ser: 1.4 mg/dL — ABNORMAL HIGH (ref 0.61–1.24)
GFR calc Af Amer: >60 mL/min
GFR calc non Af Amer: 57 mL/min — ABNORMAL LOW
Glucose, Bld: 100 mg/dL — ABNORMAL HIGH (ref 70–99)
Potassium: 3.7 mmol/L (ref 3.5–5.1)
Sodium: 138 mmol/L (ref 135–145)
Total Bilirubin: 1.5 mg/dL — ABNORMAL HIGH (ref 0.3–1.2)
Total Protein: 7.5 g/dL (ref 6.5–8.1)

## 2018-08-05 LAB — RAPID URINE DRUG SCREEN, HOSP PERFORMED
Amphetamines: NOT DETECTED
Barbiturates: NOT DETECTED
Benzodiazepines: NOT DETECTED
Cocaine: POSITIVE — AB
Opiates: NOT DETECTED
Tetrahydrocannabinol: NOT DETECTED

## 2018-08-05 LAB — ETHANOL: Alcohol, Ethyl (B): <10 mg/dL

## 2018-08-05 LAB — CBC
HCT: 43.7 % (ref 39.0–52.0)
Hemoglobin: 14.6 g/dL (ref 13.0–17.0)
MCH: 28.5 pg (ref 26.0–34.0)
MCHC: 33.4 g/dL (ref 30.0–36.0)
MCV: 85.2 fL (ref 80.0–100.0)
Platelets: 396 K/uL (ref 150–400)
RBC: 5.13 MIL/uL (ref 4.22–5.81)
RDW: 12.6 % (ref 11.5–15.5)
WBC: 10.6 K/uL — ABNORMAL HIGH (ref 4.0–10.5)
nRBC: 0 % (ref 0.0–0.2)

## 2018-08-05 LAB — CBG MONITORING, ED: GLUCOSE-CAPILLARY: 92 mg/dL (ref 70–99)

## 2018-08-05 NOTE — ED Provider Notes (Signed)
[facility] EMERGENCY DEPARTMENT Provider Note   CSN: [id number] Arrival date & time: 08/05/18  1413    History   Chief Complaint Chief Complaint  Patient presents with  . Suicidal    HPI Logan [name] is a 53 y.o. male.     HPI Patient presents to the emergency department with suicidal ideations.  The patient states he has had some recent personal issues and been using drugs.  Patient states that he has been living in a trailer.  He also states he has been attempting to walk out in traffic to harm self.  Patient denies any hallucinations.  The patient denies chest pain, shortness of breath, headache,blurred vision, neck pain, fever, cough, weakness, numbness, dizziness, anorexia, edema, abdominal pain, nausea, vomiting, diarrhea, rash, back pain, dysuria, hematemesis, bloody stool, near syncope, or syncope. Past Medical History:  Diagnosis Date  . Cocaine dependence with cocaine-induced mood disorder (HCC)   . Depression   . 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 Schizoaffective disorder, bipolar type (HCC) 06/22/2017  . Suicide attempt [facility])     Patient Active Problem List   Diagnosis Date Noted  . AKI (acute kidney injury) (HCC) 10/15/2017  . Substance induced mood disorder (HCC) 09/28/2017  . Severe recurrent major depressive disorder with psychotic features (HCC) 09/15/2017  . Suicidal ideation 03/13/2016  . Cocaine abuse without complication (HCC) 05/23/2011    History reviewed. No pertinent surgical history.      Home Medications    Prior to Admission medications   Medication Sig Start Date End Date Taking? Authorizing Provider  busPIRone (BUSPAR) 7.5 MG tablet Take 1 tablet (7.5 mg total) by mouth 2 (two) times daily. 10/04/17  Yes Withrow, Everardo All, FNP  citalopram (CELEXA) 20 MG tablet Take 1 tablet (20 mg total) by mouth daily. 10/04/17  Yes Withrow, Everardo All, FNP  QUEtiapine (SEROQUEL)  200 MG tablet Take 1 tablet (200 mg total) by mouth at bedtime. 10/04/17  Yes Oneta Rack, NP  traZODone (DESYREL) 50 MG tablet Take 1 tablet (50 mg total) by mouth at bedtime. 10/04/17  Yes Oneta Rack, NP    Family History History reviewed. No pertinent family history.  Social History Social History   Tobacco Use  . Smoking status: Former Games developer  . Smokeless tobacco: Never Used  Substance Use Topics  . Alcohol use: Yes    Comment: "couple of 40's a week" as of 10/15/17  . Drug use: Yes    Types: Cocaine, "Crack" cocaine    Comment: today      Allergies   Asa [aspirin]; Nicotine; Paxil [paroxetine hcl]; and Tylenol [acetaminophen]   Review of Systems Review of Systems All other systems negative except as documented in the HPI. All pertinent positives and negatives as reviewed in the HPI.  Physical Exam Updated Vital Signs BP 124/76 (BP Location: Left Arm)   Pulse 88   Temp 98.3 F (36.8 C) (Oral)   Resp 16   SpO2 96%   Physical Exam Vitals signs and nursing note reviewed.  Constitutional:      General: He is not in acute distress.    Appearance: He is well-developed.  HENT:     Head: Normocephalic and atraumatic.  Eyes:     Pupils: Pupils are equal, round, and reactive to light.  Neck:     Musculoskeletal: Normal range of motion and neck supple.  Cardiovascular:     Rate and Rhythm: Normal  rate and regular rhythm.     Heart sounds: Normal heart sounds. No murmur. No friction rub. No gallop.   Pulmonary:     Effort: Pulmonary effort is normal. No respiratory distress.     Breath sounds: Normal breath sounds. No wheezing.  Abdominal:     General: Bowel sounds are normal. There is no distension.     Palpations: Abdomen is soft.     Tenderness: There is no abdominal tenderness.  Skin:    General: Skin is warm and dry.     Capillary Refill: Capillary refill takes less than 2 seconds.     Findings: No erythema or rash.  Neurological:     Mental Status:  He is alert and oriented to person, place, and time.     Motor: No abnormal muscle tone.     Coordination: Coordination normal.  Psychiatric:        Mood and Affect: Mood is depressed.        Behavior: Behavior normal.        Thought Content: Thought content includes suicidal ideation. Thought content includes suicidal plan.      ED Treatments / Results  Labs (all labs ordered are listed, but only abnormal results are displayed) Labs Reviewed  COMPREHENSIVE METABOLIC PANEL - Abnormal; Notable for the following components:      Result Value   Glucose, Bld 100 (*)    Creatinine, Ser 1.40 (*)    AST 43 (*)    Total Bilirubin 1.5 (*)    GFR calc non Af Amer 57 (*)    All other components within normal limits  CBC - Abnormal; Notable for the following components:   WBC 10.6 (*)    All other components within normal limits  RAPID URINE DRUG SCREEN, HOSP PERFORMED - Abnormal; Notable for the following components:   Cocaine POSITIVE (*)    All other components within normal limits  ETHANOL  CBG MONITORING, ED    EKG None  Radiology No results found.  Procedures Procedures (including critical care time)  Medications Ordered in ED Medications - No data to display   Initial Impression / Assessment and Plan / ED Course  I have reviewed the triage vital signs and the nursing notes.  Pertinent labs & imaging results that were available during my care of the patient were reviewed by me and considered in my medical decision making (see chart for details).        Patient will need TTS assessment for suicidal ideations and plan.  Patient is otherwise stable here in the emergency department.  Final Clinical Impressions(s) / ED Diagnoses   Final diagnoses:  None    ED Discharge Orders    None       Charlestine Night, PA-C 08/05/18 2357    Little, Ambrose Finland, MD 08/06/18 [phone]

## 2018-08-05 NOTE — ED Notes (Signed)
Belongings placed in locker 2.  
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
## 2018-08-05 NOTE — ED Notes (Signed)
Dinner tray ordered.

## 2018-08-06 ENCOUNTER — Other Ambulatory Visit: Payer: Self-pay | Admitting: Family

## 2018-08-06 ENCOUNTER — Other Ambulatory Visit: Payer: Self-pay

## 2018-08-06 ENCOUNTER — Inpatient Hospital Stay (HOSPITAL_COMMUNITY)
Admission: AD | Admit: 2018-08-06 | Discharge: 2018-08-10 | DRG: 885 | Disposition: A | Discharge status: Home / Self Care | Payer: Federal, State, Local not specified - Other | Source: Intra-hospital | Attending: Psychiatry | Admitting: Psychiatry

## 2018-08-06 ENCOUNTER — Encounter (HOSPITAL_COMMUNITY): Payer: Self-pay

## 2018-08-06 DIAGNOSIS — Z888 Allergy status to other drugs, medicaments and biological substances status: Secondary | ICD-10-CM

## 2018-08-06 DIAGNOSIS — Z560 Unemployment, unspecified: Secondary | ICD-10-CM

## 2018-08-06 DIAGNOSIS — G4700 Insomnia, unspecified: Secondary | ICD-10-CM | POA: Diagnosis present

## 2018-08-06 DIAGNOSIS — F250 Schizoaffective disorder, bipolar type: Secondary | ICD-10-CM | POA: Diagnosis present

## 2018-08-06 DIAGNOSIS — F332 Major depressive disorder, recurrent severe without psychotic features: Secondary | ICD-10-CM | POA: Diagnosis not present

## 2018-08-06 DIAGNOSIS — F411 Generalized anxiety disorder: Secondary | ICD-10-CM | POA: Diagnosis present

## 2018-08-06 DIAGNOSIS — F339 Major depressive disorder, recurrent, unspecified: Secondary | ICD-10-CM | POA: Diagnosis present

## 2018-08-06 DIAGNOSIS — Z87891 Personal history of nicotine dependence: Secondary | ICD-10-CM | POA: Diagnosis not present

## 2018-08-06 DIAGNOSIS — F1420 Cocaine dependence, uncomplicated: Secondary | ICD-10-CM | POA: Diagnosis present

## 2018-08-06 DIAGNOSIS — Z915 Personal history of self-harm: Secondary | ICD-10-CM

## 2018-08-06 DIAGNOSIS — F4310 Post-traumatic stress disorder, unspecified: Secondary | ICD-10-CM | POA: Diagnosis present

## 2018-08-06 DIAGNOSIS — R45851 Suicidal ideations: Secondary | ICD-10-CM | POA: Diagnosis present

## 2018-08-06 DIAGNOSIS — Z79899 Other long term (current) drug therapy: Secondary | ICD-10-CM

## 2018-08-06 DIAGNOSIS — Z886 Allergy status to analgesic agent status: Secondary | ICD-10-CM | POA: Diagnosis not present

## 2018-08-06 DIAGNOSIS — F251 Schizoaffective disorder, depressive type: Secondary | ICD-10-CM | POA: Diagnosis not present

## 2018-08-06 DIAGNOSIS — Z79891 Long term (current) use of opiate analgesic: Secondary | ICD-10-CM

## 2018-08-06 DIAGNOSIS — F1424 Cocaine dependence with cocaine-induced mood disorder: Secondary | ICD-10-CM | POA: Diagnosis not present

## 2018-08-06 MED ORDER — BUSPIRONE HCL 7.5 MG PO TABS
7.5000 mg | ORAL_TABLET | Freq: Two times a day (BID) | ORAL | Status: DC
  Administered 2018-08-07 – 2018-08-08 (×3): 7.5 mg via ORAL
  Filled 2018-08-06 (×6): qty 1

## 2018-08-06 MED ORDER — MAGNESIUM HYDROXIDE 400 MG/5ML PO SUSP
30.0000 mL | Freq: Every day | ORAL | Status: DC | PRN
  Administered 2018-08-08: 30 mL via ORAL
  Filled 2018-08-06: qty 30

## 2018-08-06 MED ORDER — CITALOPRAM HYDROBROMIDE 10 MG PO TABS
20.0000 mg | ORAL_TABLET | Freq: Every day | ORAL | Status: DC
  Administered 2018-08-06: 20 mg via ORAL
  Filled 2018-08-06: qty 2

## 2018-08-06 MED ORDER — QUETIAPINE FUMARATE 200 MG PO TABS
200.0000 mg | ORAL_TABLET | Freq: Every day | ORAL | Status: DC
  Filled 2018-08-06 (×2): qty 1

## 2018-08-06 MED ORDER — ALUM & MAG HYDROXIDE-SIMETH 200-200-20 MG/5ML PO SUSP
30.0000 mL | ORAL | Status: DC | PRN
  Administered 2018-08-08 – 2018-08-09 (×2): 30 mL via ORAL
  Filled 2018-08-06 (×2): qty 30

## 2018-08-06 MED ORDER — TRAZODONE HCL 50 MG PO TABS
50.0000 mg | ORAL_TABLET | Freq: Every evening | ORAL | Status: DC | PRN
  Administered 2018-08-07 – 2018-08-09 (×3): 50 mg via ORAL
  Filled 2018-08-06 (×3): qty 1

## 2018-08-06 MED ORDER — TRAZODONE HCL 50 MG PO TABS: 50.0000 mg | ORAL_TABLET | Freq: Every day | ORAL | Status: DC

## 2018-08-06 MED ORDER — QUETIAPINE FUMARATE 100 MG PO TABS
100.0000 mg | ORAL_TABLET | Freq: Every day | ORAL | Status: DC
  Administered 2018-08-06 – 2018-08-09 (×4): 100 mg via ORAL
  Filled 2018-08-06: qty 14
  Filled 2018-08-06 (×6): qty 1

## 2018-08-06 MED ORDER — BUSPIRONE HCL 15 MG PO TABS
7.5000 mg | ORAL_TABLET | Freq: Two times a day (BID) | ORAL | Status: DC
  Administered 2018-08-06: 7.5 mg via ORAL
  Filled 2018-08-06 (×3): qty 1
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
## 2018-08-06 NOTE — Progress Notes (Signed)
Patient ID: Logan [name], male   DOB: 02/02/1966, 53 y.o.   MRN: [id number]  Admission Note  D) Patient admitted to the adult unit 300 hall. Patient is a 53 year old male who is voluntary from [facility]. Patient presents calm, pleasant and cooperative. Patient states he has been off of his medications for 1 month when his wife left with them. Patient states he would like to be restarted on his medications and receive detox from cocaine while here. Patient currently denies SI/HI/AVH. Patient denies legal issues and states he can return to his mobile home on discharge.  Skin assessment was completed and unremarkable. Patient belongings searched with no contraband found. Belongings secured in locker. Vital signs obtained and were low; Gatorade aide and meal tray provided.   A) Plan of care, unit policies and patient expectations were explained. Written consents obtained. Patient oriented to the unit and their room. Patient placed on standard q15 safety checks. High fall risk precautions initiated and reviewed with patient.   R) Patient is in no acute distress and verbalizes understanding of information provided. Patient with no concerns at this time. Patient contracts for safety with staff on the unit. Report given to receiving RN.

## 2018-08-06 NOTE — Progress Notes (Signed)
Per [facility], pt may arrive at any time. Caryn Bee RN made aware.  Jupiter Kabir S. Alan Ripper, MSW, LCSW Clinical Social Worker 08/06/2018 5:16 PM

## 2018-08-06 NOTE — ED Notes (Signed)
[facility] just called and pt can come now.

## 2018-08-06 NOTE — ED Notes (Signed)
[facility] called this RN and informed that pt is accepted at [facility] and can come at 2000 this evening. Report can be called to [phone]

## 2018-08-06 NOTE — Tx Team (Signed)
Initial Treatment Plan 08/06/2018 7:15 PM [facility] [name] [id number]    PATIENT STRESSORS: Marital or family conflict Medication change or noncompliance Substance abuse   PATIENT STRENGTHS: Ability for insight Average or above average intelligence General fund of knowledge Motivation for treatment/growth Physical Health   PATIENT IDENTIFIED PROBLEMS: "detox"  "get back on my medications"  Suicide Risk  Substance Abuse               DISCHARGE CRITERIA:  Ability to meet basic life and health needs Adequate post-discharge living arrangements Improved stabilization in mood, thinking, and/or behavior Medical problems require only outpatient monitoring Motivation to continue treatment in a less acute level of care Need for constant or close observation no longer present Reduction of life-threatening or endangering symptoms to within safe limits Safe-care adequate arrangements made Verbal commitment to aftercare and medication compliance Withdrawal symptoms are absent or subacute and managed without 24-hour nursing intervention  PRELIMINARY DISCHARGE PLAN: Outpatient therapy  PATIENT/FAMILY INVOLVEMENT: This treatment plan has been presented to and reviewed with the patient, Logan [name].  The patient and family have been given the opportunity to ask questions and make suggestions.  Ferrel [facility], RN 08/06/2018, 7:15 PM

## 2018-08-06 NOTE — Progress Notes (Signed)
Per [facility], [facility], pt has been accepted to [facility] bed 307/02. Accepting provider is Reola Calkins NP, Attending provider is Dr. Jama Flavors MD. Patient can arrive by 8:00PM. Number for report is [phone]. CSW spoke with Caryn Bee RN regarding disposition.   Trula Slade, MSW, LCSW Clinical Social Worker 08/06/2018 2:32 PM

## 2018-08-06 NOTE — ED Notes (Signed)
Lunch tray ordered 

## 2018-08-06 NOTE — ED Notes (Signed)
Breakfast Tray ordered  

## 2018-08-07 DIAGNOSIS — F1424 Cocaine dependence with cocaine-induced mood disorder: Secondary | ICD-10-CM

## 2018-08-07 DIAGNOSIS — F251 Schizoaffective disorder, depressive type: Secondary | ICD-10-CM

## 2018-08-07 MED ORDER — CITALOPRAM HYDROBROMIDE 10 MG PO TABS
10.0000 mg | ORAL_TABLET | Freq: Every day | ORAL | Status: DC
  Administered 2018-08-07 – 2018-08-10 (×4): 10 mg via ORAL
  Filled 2018-08-07 (×4): qty 1
  Filled 2018-08-07: qty 14
  Filled 2018-08-07 (×2): qty 1

## 2018-08-07 NOTE — H&P (Signed)
Psychiatric Admission Assessment Adult  Patient Identification: Logan [name] MRN:  [id number] Date of Evaluation:  08/07/2018 Chief Complaint:  " got sick and tired of feeling down and out" Principal Diagnosis:  Schizoaffective Disorder , Depressed. Cocaine Use Disorder Diagnosis:  Active Problems:   MDD (major depressive disorder), recurrent episode (HCC)  History of Present Illness: 53 year old male, presented to [facility] voluntarily reporting worsening depression, x 2 weeks, but particularly so over recent days, with recent suicidal thoughts , thoughts of walking into traffic . Also reports auditory hallucinations , which he describes as telling him he should kill self because life is not worth living . He reports significant stressors- marital discord/separation ( states his wife and him have repeatedly tried to get back together without success and that she left him again recently) , being off psychiatric medications x several weeks, financial stressors, relapse on cocaine . Patient reports history of Cocaine Abuse , states he had been sober for several months until he relapsed earlier this week.   Associated Signs/Symptoms: Depression Symptoms:  depressed mood, anhedonia, insomnia, suicidal thoughts with specific plan, anxiety, loss of energy/fatigue, (Hypo) Manic Symptoms: none noted or endorsed  Anxiety Symptoms:  Reports increased anxiety , worry recently , mainly related to marital conflict  Psychotic Symptoms:  Reports auditory hallucinations, which he states say things like " you should kill yourself , life is not worth living " PTSD Symptoms: Reports occasional nightmares and recollections related to being bullied in school Total Time spent with patient: 45 minutes  Past Psychiatric History: history of prior psychiatric admissions , for depression, suicidal ideations. History of suicide attempt by stabbing self several years ago. Reports history of auditory  hallucinations which he reports as chronic and usually well controlled with medications. Patient is known to [facility] from prior admission in May of 2019. At the time reported depression, suicidal ideations, auditory hallucinations. Diagnosed with Substance Induced Mood Disorder. He has also been diagnosed with Schizoaffective Disorder in the past . Currently does not endorse any clear history of mania or hypomania. Denies history of violence   Is the patient at risk to self? Yes.    Has the patient been a risk to self in the past 6 months? Yes.    Has the patient been a risk to self within the distant past? Yes.    Is the patient a risk to others? No.  Has the patient been a risk to others in the past 6 months? No.  Has the patient been a risk to others within the distant past? No.   Prior Inpatient Therapy:  yes - see above  Prior Outpatient Therapy:  was going to [facility] for outpatient treatment  Alcohol Screening: 1. How often do you have a drink containing alcohol?: Never 2. How many drinks containing alcohol do you have on a typical day when you are drinking?: 1 or 2 3. How often do you have six or more drinks on one occasion?: Never AUDIT-C Score: 0 9. Have you or someone else been injured as a result of your drinking?: No 10. Has a relative or friend or a doctor or another health worker been concerned about your drinking or suggested you cut down?: No Alcohol Use Disorder Identification Test Final Score (AUDIT): 0 Alcohol Brief Interventions/Follow-up: AUDIT Score <7 follow-up not indicated Substance Abuse History in the last 12 months: remote history of alcohol abuse in his 82s, but states he no longer drinks, history of cocaine abuse, and has  been using crack cocaine regularly, often daily Consequences of Substance Abuse: Remote history of alcohol WDL seizure many years ago Previous Psychotropic Medications: Reports he was taking Buspar , Celexa, Seroquel, Trazodone until about one  month ago ( wife left and took his medications). States these medications were well tolerated and helpful  Psychological Evaluations:  No  Past Medical History: denies medical illnesses  Past Medical History:  Diagnosis Date  . Cocaine dependence with cocaine-induced mood disorder (HCC)   . Depression   . 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 Schizoaffective disorder, bipolar type (HCC) 06/22/2017  . Suicide attempt [facility])    History reviewed. No pertinent surgical history. Family History: parents deceased, mother died from lung cancer,father died of MI, has two brothers  Family Psychiatric  History: Grandmother had history of Bipolar Disorder, no suicides in family. Brother and an uncle have history of alcohol abuse  Tobacco Screening: Have you used any form of tobacco in the last 30 days? (Cigarettes, Smokeless Tobacco, Cigars, and/or Pipes): No Social History: 53, separated, currently living in a camper, unemployed,has three adult children, denies legal issues Social History   Substance and Sexual Activity  Alcohol Use Yes   Comment: "couple of 40's a week" as of 10/15/17     Social History   Substance and Sexual Activity  Drug Use Yes  . Types: Cocaine, "Crack" cocaine   Comment: today     Additional Social History:  Allergies:   Allergies  Allergen Reactions  . Asa [Aspirin] Hives  . Nicotine Other (See Comments)    migraine  . Paxil [Paroxetine Hcl] Other (See Comments)    shaking  . Tylenol [Acetaminophen] Hives   Lab Results:  Results for orders placed or performed during the hospital encounter of 08/05/18 (from the past 48 hour(s))  Comprehensive metabolic panel     Status: Abnormal   Collection Time: 08/05/18  2:43 PM  Result Value Ref Range   Sodium 138 135 - 145 mmol/L   Potassium 3.7 3.5 - 5.1 mmol/L   Chloride 104 98 - 111 mmol/L   CO2 23 22 - 32 mmol/L   Glucose, Bld 100 (H) 70 - 99 mg/dL   BUN 18 6 - 20  mg/dL   Creatinine, Ser 1.61 (H) 0.61 - 1.24 mg/dL   Calcium 9.1 8.9 - 09.6 mg/dL   Total Protein 7.5 6.5 - 8.1 g/dL   Albumin 4.0 3.5 - 5.0 g/dL   AST 43 (H) 15 - 41 U/L   ALT 32 0 - 44 U/L   Alkaline Phosphatase 79 38 - 126 U/L   Total Bilirubin 1.5 (H) 0.3 - 1.2 mg/dL   GFR calc non Af Amer 57 (L) >60 mL/min   GFR calc Af Amer >60 >60 mL/min   Anion gap 11 5 - 15    Comment: Performed at [facility], [address], [place], [place] [address]  Ethanol     Status: None   Collection Time: 08/05/18  2:43 PM  Result Value Ref Range   Alcohol, Ethyl (B) <10 <10 mg/dL    Comment: (NOTE) Lowest detectable limit for serum alcohol is 10 mg/dL. For medical purposes only. Performed at [facility], [address], [place], [place] [address]   cbc     Status: Abnormal   Collection Time: 08/05/18  2:43 PM  Result Value Ref Range   WBC 10.6 (H) 4.0 - 10.5 K/uL   RBC 5.13  4.22 - 5.81 MIL/uL   Hemoglobin 14.6 13.0 - 17.0 g/dL   HCT 83.3 82.5 - 05.3 %   MCV 85.2 80.0 - 100.0 fL   MCH 28.5 26.0 - 34.0 pg   MCHC 33.4 30.0 - 36.0 g/dL   RDW 97.6 73.4 - 19.3 %   Platelets 396 150 - 400 K/uL   nRBC 0.0 0.0 - 0.2 %    Comment: Performed at [facility], [address], [place], [place] [address]  CBG monitoring, [facility]     Status: None   Collection Time: 08/05/18  2:46 PM  Result Value Ref Range   Glucose-Capillary 92 70 - 99 mg/dL  Rapid urine drug screen (hospital performed)     Status: Abnormal   Collection Time: 08/05/18  5:36 PM  Result Value Ref Range   Opiates NONE DETECTED NONE DETECTED   Cocaine POSITIVE (A) NONE DETECTED   Benzodiazepines NONE DETECTED NONE DETECTED   Amphetamines NONE DETECTED NONE DETECTED   Tetrahydrocannabinol NONE DETECTED NONE DETECTED   Barbiturates NONE DETECTED NONE DETECTED    Comment: (NOTE) DRUG SCREEN FOR MEDICAL PURPOSES ONLY.  IF CONFIRMATION IS NEEDED FOR ANY PURPOSE, NOTIFY LAB WITHIN 5 DAYS. LOWEST DETECTABLE LIMITS FOR  URINE DRUG SCREEN Drug Class                     Cutoff (ng/mL) Amphetamine and metabolites    1000 Barbiturate and metabolites    200 Benzodiazepine                 200 Tricyclics and metabolites     300 Opiates and metabolites        300 Cocaine and metabolites        300 THC                            50 Performed at [facility], [address], [place], [place] [address]     Blood Alcohol level:  Lab Results  Component Value Date   [facility] <10 08/05/2018   ETH <10 10/24/2017    Metabolic Disorder Labs:  Lab Results  Component Value Date   HGBA1C 5.3 06/23/2017   MPG 105.41 06/23/2017   No results found for: PROLACTIN Lab Results  Component Value Date   CHOL 151 06/23/2017   TRIG 96 06/23/2017   HDL 61 06/23/2017   CHOLHDL 2.5 06/23/2017   VLDL 19 06/23/2017   LDLCALC 71 06/23/2017    Current Medications: Current Facility-Administered Medications  Medication Dose Route Frequency Provider Last Rate Last Dose  . alum & mag hydroxide-simeth (MAALOX/MYLANTA) 200-200-20 MG/5ML suspension 30 mL  30 mL Oral Q4H PRN Oneta Rack, NP      . busPIRone (BUSPAR) tablet 7.5 mg  7.5 mg Oral BID Oneta Rack, NP      . magnesium hydroxide (MILK OF MAGNESIA) suspension 30 mL  30 mL Oral Daily PRN Oneta Rack, NP      . QUEtiapine (SEROQUEL) tablet 100 mg  100 mg Oral QHS Oneta Rack, NP   100 mg at 08/06/18 2234  . traZODone (DESYREL) tablet 50 mg  50 mg Oral QHS PRN Oneta Rack, NP       PTA Medications: Medications Prior to Admission  Medication Sig Dispense Refill Last Dose  . busPIRone (BUSPAR) 7.5 MG tablet Take 1 tablet (7.5 mg total) by mouth 2 (two) times daily.  60 tablet 0 >53mo  . citalopram (CELEXA) 20 MG tablet Take 1 tablet (20 mg total) by mouth daily. 30 tablet 0 >52mo  . QUEtiapine (SEROQUEL) 200 MG tablet Take 1 tablet (200 mg total) by mouth at bedtime. 30 tablet 0 >75mo  . traZODone (DESYREL) 50 MG tablet Take 1 tablet (50 mg total) by  mouth at bedtime. 30 tablet 0 >62mo    Musculoskeletal: Strength & Muscle Tone: within normal limits Gait & Station: normal Patient leans: N/A  Psychiatric Specialty Exam: Physical Exam  Review of Systems  Constitutional: Negative.  Negative for chills and fever.  HENT: Negative.   Eyes: Negative.   Respiratory: Negative.  Negative for cough and shortness of breath.   Cardiovascular: Negative.   Gastrointestinal: Negative.  Negative for nausea and vomiting.  Genitourinary: Negative.   Musculoskeletal: Negative.   Skin: Negative.   Neurological: Negative.   Endo/Heme/Allergies: Negative.   Psychiatric/Behavioral: Positive for depression, substance abuse and suicidal ideas.  All other systems reviewed and are negative.   Blood pressure (!) 125/104, pulse 69, temperature 97.7 F (36.5 C), temperature source Oral, resp. rate 18, height 6' (1.829 m), weight 104.3 kg, SpO2 100 %.Body mass index is 31.19 kg/m.  General Appearance: Fairly Groomed  Eye Contact:  Fair  Speech:  Normal Rate  Volume:  Normal  Mood:  Depressed states feeling partially better since admission, today describes as 5/10  Affect:  Congruent  Thought Process:  Linear and Descriptions of Associations: Intact  Orientation:  Full (Time, Place, and Person)  Thought Content:  reports auditory hallucinations, does not currently present internally preoccupied. No delusions are expressed   Suicidal Thoughts:  No denies any current suicidal plan or intention and contracts for safety on unit, denies homicidal or violent ideations  Homicidal Thoughts:  No  Memory:  recent and remote grossly intact   Judgement:  Fair  Insight:  Fair  Psychomotor Activity:  Normal- no psychomotor agitation or tremors  Concentration:  Concentration: Good and Attention Span: Good  Recall:  Good  Fund of Knowledge:  Good  Language:  Good  Akathisia:  Negative  Handed:  Right  AIMS (if indicated):     Assets:  Communication  Skills Desire for Improvement Resilience  ADL's:  Intact  Cognition:  WNL  Sleep:  Number of Hours: 6.75    Treatment Plan Summary: Daily contact with patient to assess and evaluate symptoms and progress in treatment, Medication management, Plan inpatient treatment  and medications as below  Observation Level/Precautions:  15 minute checks  Laboratory:  as needed   Psychotherapy:  Milieu, group therapy   Medications:  Restarted on home medications, which he reports have been well tolerated and helpful. Restart Seroquel at 100 mgrs QHS, Buspar at 7.5 mgrs BID, Celexa 10 mgrs QDAY,  Trazodone at 50 mgrs QHS  Consultations:  -  Discharge Concerns:  -  Estimated LOS: 5 days   Other:     Physician Treatment Plan for Primary Diagnosis: Cocaine Induced Mood Disorder, versus Schizoaffective Disorder , Depressed  Long Term Goal(s): Improvement in symptoms so as ready for discharge  Short Term Goals: Ability to identify changes in lifestyle to reduce recurrence of condition will improve and Ability to maintain clinical measurements within normal limits will improve  Physician Treatment Plan for Secondary Diagnosis: Cocaine Use Disorder Long Term Goal(s): Improvement in symptoms so as ready for discharge  Short Term Goals: Ability to identify triggers associated with substance abuse/mental health issues will improve  I certify that inpatient services furnished can reasonably be expected to improve the patient's condition.    Craige Cotta, MD 3/15/20209:48 AM

## 2018-08-07 NOTE — [facility] Group Notes (Signed)
[facility] LCSW Group Therapy Note  Date/Time:  08/07/2018 9:00-10:00 or 10:00-11:00AM  Type of Therapy and Topic:  Group Therapy:  Healthy and Unhealthy Supports  Participation Level:  Did Not Attend   Description of Group:  Patients in this group were introduced to the idea of adding a variety of healthy supports to address the various needs in their lives.Patients discussed what additional healthy supports could be helpful in their recovery and wellness after discharge in order to prevent future hospitalizations.   An emphasis was placed on using counselor, doctor, therapy groups, 12-step groups, and problem-specific support groups to expand supports.  They also worked as a group on developing a specific plan for several patients to deal with unhealthy supports through boundary-setting, psychoeducation with loved ones, and even termination of relationships.   Therapeutic Goals:   1)  discuss importance of adding supports to stay well once out of the hospital  2)  compare healthy versus unhealthy supports and identify some examples of each  3)  generate ideas and descriptions of healthy supports that can be added  4)  offer mutual support about how to address unhealthy supports  5)  encourage active participation in and adherence to discharge plan    Summary of Patient Progress:  The patient  Did not attend  Therapeutic Modalities:   Motivational Interviewing Brief Solution-Focused Therapy  Evorn Gong

## 2018-08-07 NOTE — Progress Notes (Signed)
D Pt is observed UAL on the 300 hall today, tolerated well. No complaints. Affect flat and depressed. Interacting with peers.     A Pt completed daily assessment and on it he wrote he has ahd SI today but he contracted to stay safe. He rated his depression, hopelessness and anxiety " 8/8/8", respectiveky.      R Safety in place.

## 2018-08-07 NOTE — Progress Notes (Signed)
Writer took patients medications to his room since he had fallen asleep. He reports that he was just tired and was getting him some rest. Medication given and patient was able to return to sleep. Safety maintained on unit with 15 min checks.

## 2018-08-07 NOTE — [facility] Suicide Risk Assessment (Signed)
[facility] Admission Suicide Risk Assessment   Nursing information obtained from:  Patient, Review of record Demographic factors:  Male, Caucasian, Unemployed, Low socioeconomic status Current Mental Status:  Suicidal ideation indicated by patient Loss Factors:  Financial problems / change in socioeconomic status, Loss of significant relationship Historical Factors:  NA Risk Reduction Factors:  Positive social support  Total Time spent with patient: 45 minutes Principal Problem: Schizoaffective disorder/depressed.  Cocaine use disorder.  Consider substance-induced mood disorder. Diagnosis:  Active Problems:   MDD (major depressive disorder), recurrent episode (HCC)  Subjective Data:   Continued Clinical Symptoms:  Alcohol Use Disorder Identification Test Final Score (AUDIT): 0 The "Alcohol Use Disorders Identification Test", Guidelines for Use in Primary Care, Second Edition.  World Science writer [facility]). Score between 0-7:  no or low risk or alcohol related problems. Score between 8-15:  moderate risk of alcohol related problems. Score between 16-19:  high risk of alcohol related problems. Score 20 or above:  warrants further diagnostic evaluation for alcohol dependence and treatment.   CLINICAL FACTORS:  53 year old male, presented to ED voluntarily reporting worsening depression, neurovegetative symptoms of depression, auditory hallucinations, suicidal thoughts of walking into traffic.  He reports a significant contributing factors including marital discord (his wife and him had tried to get back together but she again left him recently), financial difficulties, being off his psychiatric medications for several weeks.  He also reports recent relapse on cocaine earlier this week. Of note he describes a history of good response to Seroquel, Celexa, BuSpar, Trazodone, which she has been taking up to about a month ago without side effects and with good response.    Psychiatric Specialty  Exam: Physical Exam  ROS  Blood pressure (!) 125/104, pulse 69, temperature 97.7 F (36.5 C), temperature source Oral, resp. rate 18, height 6' (1.829 m), weight 104.3 kg, SpO2 100 %.Body mass index is 31.19 kg/m.  See admit note mental status exam    COGNITIVE FEATURES THAT CONTRIBUTE TO RISK:  Closed-mindedness and Loss of executive function    SUICIDE RISK:   Moderate:  Frequent suicidal ideation with limited intensity, and duration, some specificity in terms of plans, no associated intent, good self-control, limited dysphoria/symptomatology, some risk factors present, and identifiable protective factors, including available and accessible social support.  PLAN OF CARE:Patient will be admitted to inpatient psychiatric unit for stabilization and safety. Will provide and encourage milieu participation. Provide medication management and maked adjustments as needed.  Will follow daily.   I certify that inpatient services furnished can reasonably be expected to improve the patient's condition.   Craige Cotta, MD 08/07/2018, 10:15 AM

## 2018-08-08 DIAGNOSIS — F332 Major depressive disorder, recurrent severe without psychotic features: Secondary | ICD-10-CM

## 2018-08-08 LAB — LIPID PANEL
CHOLESTEROL: 155 mg/dL (ref 0–200)
HDL: 41 mg/dL (ref >40)
LDL Cholesterol: 93 mg/dL (ref 0–99)
Total CHOL/HDL Ratio: 3.8 RATIO
Triglycerides: 105 mg/dL (ref <150)
VLDL: 21 mg/dL (ref 0–40)

## 2018-08-08 LAB — HEMOGLOBIN A1C
Hgb A1c MFr Bld: 5.1 % (ref 4.8–5.6)
Mean Plasma Glucose: 99.67 mg/dL

## 2018-08-08 LAB — TSH: TSH: 1.729 u[IU]/mL (ref 0.350–4.500)

## 2018-08-08 MED ORDER — PRAZOSIN HCL 2 MG PO CAPS
4.0000 mg | ORAL_CAPSULE | Freq: Every day | ORAL | Status: DC
  Administered 2018-08-08 – 2018-08-09 (×2): 4 mg via ORAL
  Filled 2018-08-08 (×3): qty 2

## 2018-08-08 MED ORDER — BUSPIRONE HCL 15 MG PO TABS
15.0000 mg | ORAL_TABLET | Freq: Two times a day (BID) | ORAL | Status: DC
  Administered 2018-08-08 – 2018-08-10 (×4): 15 mg via ORAL
  Filled 2018-08-08 (×2): qty 1
  Filled 2018-08-08: qty 14
  Filled 2018-08-08 (×2): qty 1
  Filled 2018-08-08: qty 14
  Filled 2018-08-08 (×2): qty 1

## 2018-08-08 NOTE — Progress Notes (Signed)
Recreation Therapy Notes  Date:  3.16.20 Time: 0930 Location: 300 Hall Dayroom  Group Topic: Stress Management  Goal Area(s) Addresses:  Patient will identify positive stress management techniques. Patient will identify benefits of using stress management post d/c.  Intervention: Stress Management  Activity :  Guided Imagery.  LRT introduced the stress management technique of guided imagery.  LRT read a script that patients on a journey at the beach to envision the peaceful waves.  Patients were to listen and follow along as script was read.  Education:  Stress Management, Discharge Planning.   Education Outcome: Acknowledges Education  Clinical Observations/Feedback:  Pt did not attend group.     Caroll Rancher, LRT/CTRS         Caroll Rancher A 08/08/2018 10:42 AM
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
## 2018-08-08 NOTE — Tx Team (Signed)
Interdisciplinary Treatment and Diagnostic Plan Update  08/08/2018 Time of Session: 9:00am [name] [facility] MRN: [id number]  Principal Diagnosis: <principal problem not specified>  Secondary Diagnoses: Active Problems:   MDD (major depressive disorder), recurrent episode (HCC)   Current Medications:  Current Facility-Administered Medications  Medication Dose Route Frequency Provider Last Rate Last Dose  . alum & mag hydroxide-simeth (MAALOX/MYLANTA) 200-200-20 MG/5ML suspension 30 mL  30 mL Oral Q4H PRN Oneta Rack, NP   30 mL at 08/08/18 1105  . busPIRone (BUSPAR) tablet 7.5 mg  7.5 mg Oral BID Oneta Rack, NP   7.5 mg at 08/08/18 0806  . citalopram (CELEXA) tablet 10 mg  10 mg Oral Daily Cobos, Rockey Situ, MD   10 mg at 08/08/18 0806  . magnesium hydroxide (MILK OF MAGNESIA) suspension 30 mL  30 mL Oral Daily PRN Oneta Rack, NP      . QUEtiapine (SEROQUEL) tablet 100 mg  100 mg Oral QHS Oneta Rack, NP   100 mg at 08/07/18 2058  . traZODone (DESYREL) tablet 50 mg  50 mg Oral QHS PRN Oneta Rack, NP   50 mg at 08/07/18 2058   PTA Medications: Medications Prior to Admission  Medication Sig Dispense Refill Last Dose  . busPIRone (BUSPAR) 7.5 MG tablet Take 1 tablet (7.5 mg total) by mouth 2 (two) times daily. 60 tablet 0 >48mo  . citalopram (CELEXA) 20 MG tablet Take 1 tablet (20 mg total) by mouth daily. 30 tablet 0 >53mo  . QUEtiapine (SEROQUEL) 200 MG tablet Take 1 tablet (200 mg total) by mouth at bedtime. 30 tablet 0 >76mo  . traZODone (DESYREL) 50 MG tablet Take 1 tablet (50 mg total) by mouth at bedtime. 30 tablet 0 >68mo    Patient Stressors: Marital or family conflict Medication change or noncompliance Substance abuse  Patient Strengths: Ability for insight Average or above average intelligence General fund of knowledge Motivation for treatment/growth Physical Health  Treatment Modalities: Medication Management, Group therapy, Case management,   1 to 1 session with clinician, Psychoeducation, Recreational therapy.   Physician Treatment Plan for Primary Diagnosis: <principal problem not specified> Long Term Goal(s): Improvement in symptoms so as ready for discharge Improvement in symptoms so as ready for discharge   Short Term Goals: Ability to identify changes in lifestyle to reduce recurrence of condition will improve Ability to maintain clinical measurements within normal limits will improve Ability to identify triggers associated with substance abuse/mental health issues will improve  Medication Management: Evaluate patient's response, side effects, and tolerance of medication regimen.  Therapeutic Interventions: 1 to 1 sessions, Unit Group sessions and Medication administration.  Evaluation of Outcomes: Progressing  Physician Treatment Plan for Secondary Diagnosis: Active Problems:   MDD (major depressive disorder), recurrent episode (HCC)  Long Term Goal(s): Improvement in symptoms so as ready for discharge Improvement in symptoms so as ready for discharge   Short Term Goals: Ability to identify changes in lifestyle to reduce recurrence of condition will improve Ability to maintain clinical measurements within normal limits will improve Ability to identify triggers associated with substance abuse/mental health issues will improve     Medication Management: Evaluate patient's response, side effects, and tolerance of medication regimen.  Therapeutic Interventions: 1 to 1 sessions, Unit Group sessions and Medication administration.  Evaluation of Outcomes: Progressing   RN Treatment Plan for Primary Diagnosis: <principal problem not specified> Long Term Goal(s): Knowledge of disease and therapeutic regimen to maintain health will improve  Short  Term Goals: Ability to participate in decision making will improve, Ability to verbalize feelings will improve, Ability to identify and develop effective coping behaviors will  improve and Compliance with prescribed medications will improve  Medication Management: RN will administer medications as ordered by provider, will assess and evaluate patient's response and provide education to patient for prescribed medication. RN will report any adverse and/or side effects to prescribing provider.  Therapeutic Interventions: 1 on 1 counseling sessions, Psychoeducation, Medication administration, Evaluate responses to treatment, Monitor vital signs and CBGs as ordered, Perform/monitor CIWA, COWS, AIMS and Fall Risk screenings as ordered, Perform wound care treatments as ordered.  Evaluation of Outcomes: Progressing   LCSW Treatment Plan for Primary Diagnosis: <principal problem not specified> Long Term Goal(s): Safe transition to appropriate next level of care at discharge, Engage patient in therapeutic group addressing interpersonal concerns.  Short Term Goals: Engage patient in aftercare planning with referrals and resources, Increase social support and Increase ability to appropriately verbalize feelings  Therapeutic Interventions: Assess for all discharge needs, 1 to 1 time with Social worker, Explore available resources and support systems, Assess for adequacy in community support network, Educate family and significant other(s) on suicide prevention, Complete Psychosocial Assessment, Interpersonal group therapy.  Evaluation of Outcomes: Progressing   Progress in Treatment: Attending groups: No. Participating in groups: No. Taking medication as prescribed: Yes. Toleration medication: Yes. Family/Significant other contact made: No, will contact:  supports if consents are granted Patient understands diagnosis: Yes. Discussing patient identified problems/goals with staff: Yes. Medical problems stabilized or resolved: Yes. Denies suicidal/homicidal ideation: No. Issues/concerns per patient self-inventory: Yes.   New problem(s) identified: No, Describe:  CSW  continuing to assess  New Short Term/Long Term Goal(s): detox, medication management for mood stabilization; elimination of SI thoughts; development of comprehensive mental wellness/sobriety plan.  Patient Goals:  "Try to get clean and stay that way."  Discharge Plan or Barriers: CSW continuing to assess for appropriate referrals. MHAG pamphlet, Mobile Crisis information, and AA/NA information provided to patient for additional community support and resources.   Reason for Continuation of Hospitalization: Anxiety Depression Suicidal ideation  Estimated Length of Stay: 3-5 days  Attendees: Patient: Logan Hudson 08/08/2018 11:15 AM  Physician: Marguerita Merles 08/08/2018 11:15 AM  Nursing: Meriam Sprague, RN 08/08/2018 11:15 AM  RN Care Manager: 08/08/2018 11:15 AM  Social Worker: Enid Cutter, LCSWA 08/08/2018 11:15 AM  Recreational Therapist:  08/08/2018 11:15 AM  Other:  08/08/2018 11:15 AM  Other:  08/08/2018 11:15 AM  Other: 08/08/2018 11:15 AM    Scribe for Treatment Team: Darreld Mclean, LCSWA 08/08/2018 11:15 AM

## 2018-08-08 NOTE — Progress Notes (Signed)
Pt invited to group.  Did not attend.  Stated "I'm not going to that."    spiritual care group on grief and loss facilitated by chaplain Burnis Kingfisher and PhD counseling intern, Ethel Rana   Group opened with brief discussion and psycho-social ed around grief and loss in relationships and in relation to self - identifying life patterns, circumstances, changes that cause losses. Established group norm of speaking from own life experience. Group goal of establishing open and affirming space for members to share loss and experience with grief, normalize grief experience and provide psycho social education and grief support.  Group drew on Worden's four tasks of grief

## 2018-08-08 NOTE — Progress Notes (Signed)
[facility] MD Progress Note  08/08/2018 11:14 AM Logan [name]  MRN:  [id number] Subjective:   Patient generally staying to himself he is in bed this morning during rounds but he is alert and oriented to person place situation and time not exact date he is cooperative with me he states he needs to stick around another day he is having dreams of using cocaine but he is not having any cravings at this point he does not have thoughts of harming himself at this point as well he is reporting no psychosis. Principal Problem: Cocaine dependency depression psychosocial stressors Diagnosis: Active Problems:   MDD (major depressive disorder), recurrent episode (HCC)  Total Time spent with patient: 20 minutes   Past Medical History:  Past Medical History:  Diagnosis Date  . Cocaine dependence with cocaine-induced mood disorder (HCC)   . Depression   . 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 Schizoaffective disorder, bipolar type (HCC) 06/22/2017  . Suicide attempt [facility])    History reviewed. No pertinent surgical history. Family History: History reviewed. No pertinent family history. Family Psychiatric  History: neg Social History:  Social History   Substance and Sexual Activity  Alcohol Use Yes   Comment: "couple of 40's a week" as of 10/15/17     Social History   Substance and Sexual Activity  Drug Use Yes  . Types: Cocaine, "Crack" cocaine   Comment: today     Social History   Socioeconomic History  . Marital status: Widowed    Spouse name: Not on file  . Number of children: Not on file  . Years of education: Not on file  . Highest education level: Not on file  Occupational History  . Not on file  Social Needs  . Financial resource strain: Not on file  . Food insecurity:    Worry: Not on file    Inability: Not on file  . Transportation needs:    Medical: Not on file    Non-medical: Not on file  Tobacco Use  . Smoking  status: Former Games developer  . Smokeless tobacco: Never Used  Substance and Sexual Activity  . Alcohol use: Yes    Comment: "couple of 40's a week" as of 10/15/17  . Drug use: Yes    Types: Cocaine, "Crack" cocaine    Comment: today   . Sexual activity: Yes  Lifestyle  . Physical activity:    Days per week: Not on file    Minutes per session: Not on file  . Stress: Not on file  Relationships  . Social connections:    Talks on phone: Not on file    Gets together: Not on file    Attends religious service: Not on file    Active member of club or organization: Not on file    Attends meetings of clubs or organizations: Not on file    Relationship status: Not on file  Other Topics Concern  . Not on file  Social History Narrative  . Not on file   Additional Social History:                         Sleep: Fair  Appetite:  Fair  Current Medications: Current Facility-Administered Medications  Medication Dose Route Frequency Provider Last Rate Last Dose  . alum & mag hydroxide-simeth (MAALOX/MYLANTA) 200-200-20 MG/5ML suspension 30 mL  30 mL Oral Q4H PRN Oneta Rack, NP   30 mL  at 08/08/18 1105  . busPIRone (BUSPAR) tablet 7.5 mg  7.5 mg Oral BID Oneta Rack, NP   7.5 mg at 08/08/18 0806  . citalopram (CELEXA) tablet 10 mg  10 mg Oral Daily Cobos, Rockey Situ, MD   10 mg at 08/08/18 0806  . magnesium hydroxide (MILK OF MAGNESIA) suspension 30 mL  30 mL Oral Daily PRN Oneta Rack, NP      . QUEtiapine (SEROQUEL) tablet 100 mg  100 mg Oral QHS Oneta Rack, NP   100 mg at 08/07/18 2058  . traZODone (DESYREL) tablet 50 mg  50 mg Oral QHS PRN Oneta Rack, NP   50 mg at 08/07/18 2058    Lab Results:  Results for orders placed or performed during the hospital encounter of 08/06/18 (from the past 48 hour(s))  TSH     Status: None   Collection Time: 08/08/18  6:36 AM  Result Value Ref Range   TSH 1.729 0.350 - 4.500 uIU/mL    Comment: Performed by a 3rd Generation  assay with a functional sensitivity of <=0.01 uIU/mL. Performed at [facility], [address], [place], [place] [address]   Hemoglobin A1c     Status: None   Collection Time: 08/08/18  6:36 AM  Result Value Ref Range   Hgb A1c MFr Bld 5.1 4.8 - 5.6 %    Comment: (NOTE) Pre diabetes:          5.7%-6.4% Diabetes:              >6.4% Glycemic control for   <7.0% adults with diabetes    Mean Plasma Glucose 99.67 mg/dL    Comment: Performed at [facility], [address], [place], [place] [address]  Lipid panel     Status: None   Collection Time: 08/08/18  6:36 AM  Result Value Ref Range   Cholesterol 155 0 - 200 mg/dL   Triglycerides 010 <272 mg/dL   HDL 41 >53 mg/dL   Total CHOL/HDL Ratio 3.8 RATIO   VLDL 21 0 - 40 mg/dL   LDL Cholesterol 93 0 - 99 mg/dL    Comment:        Total Cholesterol/HDL:CHD Risk Coronary Heart Disease Risk Table                     Men   Women  1/2 Average Risk   3.4   3.3  Average Risk       5.0   4.4  2 X Average Risk   9.6   7.1  3 X Average Risk  23.4   11.0        Use the calculated Patient Ratio above and the CHD Risk Table to determine the patient's CHD Risk.        ATP III CLASSIFICATION (LDL):  <100     mg/dL   Optimal  664-403  mg/dL   Near or Above                    Optimal  130-159  mg/dL   Borderline  474-259  mg/dL   High  >563     mg/dL   Very High Performed at [facility], [address] [place], [place] [address]     Blood Alcohol level:  Lab Results  Component Value Date   [facility] <10 08/05/2018   ETH <10 10/24/2017    Metabolic Disorder Labs: Lab Results  Component Value Date   HGBA1C 5.1 08/08/2018   MPG 99.67 08/08/2018   MPG 105.41 06/23/2017   No results found for: PROLACTIN Lab Results  Component Value Date   CHOL 155 08/08/2018   TRIG 105 08/08/2018   HDL 41 08/08/2018   CHOLHDL 3.8 08/08/2018   VLDL 21 08/08/2018   LDLCALC 93 08/08/2018   LDLCALC 71  06/23/2017    Physical Findings: AIMS: Facial and Oral Movements Muscles of Facial Expression: None, normal Lips and Perioral Area: None, normal Jaw: None, normal Tongue: None, normal,Extremity Movements Upper (arms, wrists, hands, fingers): None, normal Lower (legs, knees, ankles, toes): None, normal, Trunk Movements Neck, shoulders, hips: None, normal, Overall Severity Severity of abnormal movements (highest score from questions above): None, normal Incapacitation due to abnormal movements: None, normal Patient's awareness of abnormal movements (rate only patient's report): No Awareness, Dental Status Current problems with teeth and/or dentures?: No Does patient usually wear dentures?: No  CIWA:    COWS:     Musculoskeletal: Strength & Muscle Tone: within normal limits Gait & Station: normal Patient leans: N/A  Psychiatric Specialty Exam: Physical Exam  ROS  Blood pressure (!) 125/104, pulse 69, temperature 97.7 F (36.5 C), temperature source Oral, resp. rate 18, height 6' (1.829 m), weight 104.3 kg, SpO2 100 %.Body mass index is 31.19 kg/m.  General Appearance: Casual  Eye Contact:  Fair  Speech:  Slow  Volume:  Decreased  Mood:  Dysphoric  Affect:  Depressed  Thought Process:  Coherent  Orientation:  Full (Time, Place, and Person)  Thought Content:  Tangential  Suicidal Thoughts:  No  Homicidal Thoughts:  No  Memory:  Immediate;   Fair  Judgement:  Fair  Insight:  Fair  Psychomotor Activity:  Normal  Concentration:  Concentration: Fair  Recall:  [employer] of Knowledge:  Fair  Language:  Fair  Akathisia:  Negative  Handed:  Right  AIMS (if indicated):     Assets:  Resilience Social Support  ADL's:  Intact  Cognition:  WNL  Sleep:  Number of Hours: 6.75     Treatment Plan Summary: Daily contact with patient to assess and evaluate symptoms and progress in treatment, Medication management and Plan Meds are discussed we will add prazosin for the dreams  of use no other changes reportedly needed we will monitor blood pressure continue to report and with staff if he has cravings or thoughts of self-harm  Crystin Lechtenberg, MD 08/08/2018, 11:14 AM

## 2018-08-08 NOTE — Plan of Care (Signed)
Progress note  D: pt found in bed; compliant with medication administration. Pt states he slept fair. Pt rates his depression/hopelessness/anxiety a 6/6/8 out of 10 respectively. Pt denies any physical problems or pain, rating his pain a 0/10. Pt states his goal for today is to stay on the right path, and he will achieve this by staying out of sticky situations. Pt denies si/hi/ah/vh and verbally agrees to approach staff if these become apparent or before harming himself/others while at [facility]. Pt is animated but anxious today.  A: pt provided support and encouragement. Pt given medication per protocol and standing orders. Q21m safety checks implemented and continued.  R: pt safe on the unit. Will continue to monitor.   Pt progressing in the following metrics  Problem: Education: Goal: Knowledge of [facility] Education information/materials will improve Outcome: Progressing Goal: Emotional status will improve Outcome: Progressing Goal: Mental status will improve Outcome: Progressing Goal: Verbalization of understanding the information provided will improve Outcome: Progressing

## 2018-08-08 NOTE — Progress Notes (Signed)
Writer entered patients room to observe him lying in bed awake. He reports that he has been resting a lot because he had not slept for 3 days. He said that he has made of for eating since getting some rest. He did come to medication window and take his meds and got a snack before returning to his room. Support given and safety maintained on unit with 15 min checks.

## 2018-08-08 NOTE — [facility] Suicide Risk Assessment (Signed)
[facility] INPATIENT:  Family/Significant Other Suicide Prevention Education  Suicide Prevention Education:  Patient Refusal for Family/Significant Other Suicide Prevention Education: The patient Logan [name] has refused to provide written consent for family/significant other to be provided Family/Significant Other Suicide Prevention Education during admission and/or prior to discharge.  Physician notified.  Reviewed SPE with patient  Logan Hudson 08/08/2018, 4:21 PM

## 2018-08-09 NOTE — Progress Notes (Signed)
[facility] MD Progress Note  08/09/2018 8:51 AM Logan [name]  MRN:  [id number] Subjective:   Patient continues to state to himself largely he is cooperative though he states he would like 1 more day in the hospital to benefit from groups prior to going home to make sure his medications are in his system and doing what they are supposed to do he does not have thoughts of harming self or others can contract here no psychosis/no cravings for drugs no dreams abuse Principal Problem: Depression, suicidal thoughts, recent psychosocial stressors, history of cocaine abuse recent relapse Diagnosis: Active Problems:   MDD (major depressive disorder), recurrent episode (HCC)  Total Time spent with patient: 20 minutes   Past Medical History:  Past Medical History:  Diagnosis Date  . Cocaine dependence with cocaine-induced mood disorder (HCC)   . Depression   . 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 Schizoaffective disorder, bipolar type (HCC) 06/22/2017  . Suicide attempt [facility])    History reviewed. No pertinent surgical history. Family History: History reviewed. No pertinent family history.  Social History:  Social History   Substance and Sexual Activity  Alcohol Use Yes   Comment: "couple of 40's a week" as of 10/15/17     Social History   Substance and Sexual Activity  Drug Use Yes  . Types: Cocaine, "Crack" cocaine   Comment: today     Social History   Socioeconomic History  . Marital status: Widowed    Spouse name: Not on file  . Number of children: Not on file  . Years of education: Not on file  . Highest education level: Not on file  Occupational History  . Not on file  Social Needs  . Financial resource strain: Not on file  . Food insecurity:    Worry: Not on file    Inability: Not on file  . Transportation needs:    Medical: Not on file    Non-medical: Not on file  Tobacco Use  . Smoking status: Former Games developer  .  Smokeless tobacco: Never Used  Substance and Sexual Activity  . Alcohol use: Yes    Comment: "couple of 40's a week" as of 10/15/17  . Drug use: Yes    Types: Cocaine, "Crack" cocaine    Comment: today   . Sexual activity: Yes  Lifestyle  . Physical activity:    Days per week: Not on file    Minutes per session: Not on file  . Stress: Not on file  Relationships  . Social connections:    Talks on phone: Not on file    Gets together: Not on file    Attends religious service: Not on file    Active member of club or organization: Not on file    Attends meetings of clubs or organizations: Not on file    Relationship status: Not on file  Other Topics Concern  . Not on file  Social History Narrative  . Not on file   Additional Social History:                         Sleep: Good  Appetite:  Good  Current Medications: Current Facility-Administered Medications  Medication Dose Route Frequency Provider Last Rate Last Dose  . alum & mag hydroxide-simeth (MAALOX/MYLANTA) 200-200-20 MG/5ML suspension 30 mL  30 mL Oral Q4H PRN Oneta Rack, NP   30 mL at 08/08/18 1105  . busPIRone (  BUSPAR) tablet 15 mg  15 mg Oral BID Malvin Johns, MD   15 mg at 08/09/18 0758  . citalopram (CELEXA) tablet 10 mg  10 mg Oral Daily Cobos, Rockey Situ, MD   10 mg at 08/09/18 0758  . magnesium hydroxide (MILK OF MAGNESIA) suspension 30 mL  30 mL Oral Daily PRN Oneta Rack, NP   30 mL at 08/08/18 2200  . prazosin (MINIPRESS) capsule 4 mg  4 mg Oral QHS Malvin Johns, MD   4 mg at 08/08/18 2107  . QUEtiapine (SEROQUEL) tablet 100 mg  100 mg Oral QHS Oneta Rack, NP   100 mg at 08/08/18 2106  . traZODone (DESYREL) tablet 50 mg  50 mg Oral QHS PRN Oneta Rack, NP   50 mg at 08/08/18 2110    Lab Results:  Results for orders placed or performed during the hospital encounter of 08/06/18 (from the past 48 hour(s))  TSH     Status: None   Collection Time: 08/08/18  6:36 AM  Result Value Ref  Range   TSH 1.729 0.350 - 4.500 uIU/mL    Comment: Performed by a 3rd Generation assay with a functional sensitivity of <=0.01 uIU/mL. Performed at [facility], [address] [place], [place] [address]   Hemoglobin A1c     Status: None   Collection Time: 08/08/18  6:36 AM  Result Value Ref Range   Hgb A1c MFr Bld 5.1 4.8 - 5.6 %    Comment: (NOTE) Pre diabetes:          5.7%-6.4% Diabetes:              >6.4% Glycemic control for   <7.0% adults with diabetes    Mean Plasma Glucose 99.67 mg/dL    Comment: Performed at [facility], [address], [place], [place] [address]  Lipid panel     Status: None   Collection Time: 08/08/18  6:36 AM  Result Value Ref Range   Cholesterol 155 0 - 200 mg/dL   Triglycerides 098 <119 mg/dL   HDL 41 >14 mg/dL   Total CHOL/HDL Ratio 3.8 RATIO   VLDL 21 0 - 40 mg/dL   LDL Cholesterol 93 0 - 99 mg/dL    Comment:        Total Cholesterol/HDL:CHD Risk Coronary Heart Disease Risk Table                     Men   Women  1/2 Average Risk   3.4   3.3  Average Risk       5.0   4.4  2 X Average Risk   9.6   7.1  3 X Average Risk  23.4   11.0        Use the calculated Patient Ratio above and the CHD Risk Table to determine the patient's CHD Risk.        ATP III CLASSIFICATION (LDL):  <100     mg/dL   Optimal  782-956  mg/dL   Near or Above                    Optimal  130-159  mg/dL   Borderline  213-086  mg/dL   High  >578     mg/dL   Very High Performed at [facility], [address], [place], [place] [address]     Blood Alcohol level:  Lab Results  Component Value Date  ETH <10 08/05/2018   ETH <10 10/24/2017    Metabolic Disorder Labs: Lab Results  Component Value Date   HGBA1C 5.1 08/08/2018   MPG 99.67 08/08/2018   MPG 105.41 06/23/2017   No results found for: PROLACTIN Lab Results  Component Value Date   CHOL 155 08/08/2018   TRIG 105 08/08/2018   HDL 41 08/08/2018    CHOLHDL 3.8 08/08/2018   VLDL 21 08/08/2018   LDLCALC 93 08/08/2018   LDLCALC 71 06/23/2017    Physical Findings: AIMS: Facial and Oral Movements Muscles of Facial Expression: None, normal Lips and Perioral Area: None, normal Jaw: None, normal Tongue: None, normal,Extremity Movements Upper (arms, wrists, hands, fingers): None, normal Lower (legs, knees, ankles, toes): None, normal, Trunk Movements Neck, shoulders, hips: None, normal, Overall Severity Severity of abnormal movements (highest score from questions above): None, normal Incapacitation due to abnormal movements: None, normal Patient's awareness of abnormal movements (rate only patient's report): No Awareness, Dental Status Current problems with teeth and/or dentures?: No Does patient usually wear dentures?: No  CIWA:    COWS:     Musculoskeletal: Strength & Muscle Tone: within normal limits Gait & Station: normal Patient leans: N/A  Psychiatric Specialty Exam: Physical Exam  ROS  Blood pressure 125/78, pulse 93, temperature 98 F (36.7 C), temperature source Oral, resp. rate 18, height 6' (1.829 m), weight 104.3 kg, SpO2 100 %.Body mass index is 31.19 kg/m.  General Appearance: Casual  Eye Contact:  Good  Speech:  Clear and Coherent  Volume:  Softer than normal  Mood:  Anxious and Dysphoric  Affect:  Appropriate  Thought Process:  Coherent  Orientation:  Full (Time, Place, and Person)  Thought Content:  Tangential  Suicidal Thoughts:  No  Homicidal Thoughts:  No  Memory:  Immediate;   Fair  Judgement:  Fair  Insight:  Fair  Psychomotor Activity:  Decreased  Concentration:  Concentration: Poor  Recall:  [employer] of Knowledge:  Fair  Language:  Fair  Akathisia:  Negative  Handed:  Right  AIMS (if indicated):     Assets:  Physical Health Resilience  ADL's:  Intact  Cognition:  WNL  Sleep:  Number of Hours: 6.75     Treatment Plan Summary: Daily contact with patient to assess and evaluate  symptoms and progress in treatment, Medication management and Plan Continue current meds without change continue current precautions probable discharge tomorrow  Malvin Johns, MD 08/09/2018, 8:51 AM

## 2018-08-09 NOTE — Plan of Care (Signed)
  Problem: Education: Goal: Mental status will improve Outcome: Progressing   Problem: Activity: Goal: Interest or engagement in activities will improve Outcome: Progressing  D: Pt alert and oriented on the unit. Pt engaging with RN staff and other pts. Pt denies SI/HI, A/VH. Pt's affect was flat on the unit today, and his goal is to "stay clean." Pt is pleasant and cooperative. A: Education, support and encouragement provided, q15 minute safety checks remain in effect. Medications administered per MD orders. R: No reactions/side effects to medicine noted. Pt denies any concerns at this time, and verbally contracts for safety. Pt ambulating on the unit with no issues. Pt remains safe on and off the unit.

## 2018-08-09 NOTE — Plan of Care (Signed)
D: Patient is alert, pleasant, and cooperative. Denies SI, HI, AVH, and verbally contracts for safety. Patient denies physical symptoms/pain. Patient states his "day was ok, ready to go home, my wife wont be there".    A: Medications administered per MD order. Support provided. Patient educated on safety on the unit and medications. Routine safety checks every 15 minutes. Patient stated understanding to tell nurse about any new physical symptoms. Patient understands to tell staff of any needs.     R: No adverse drug reactions noted. Patient verbally contracts for safety. Patient remains safe at this time and will continue to monitor.   Problem: Education: Goal: Mental status will improve Outcome: Progressing   Problem: Safety: Goal: Periods of time without injury will increase Outcome: Progressing  Patient denies SI, HI, AVH, and contracts for safety. Patient remains safe and will continue to monitor.

## 2018-08-09 NOTE — Progress Notes (Signed)
DAR NOTE: Pt present with calm affect and jovial  mood in the unit. Pt observed in the milieu interacting well with peers. Pt stated he had a good, and probably ate too much and was complaining of stomach upset. Pt denies physical pain, took all his meds as scheduled. Pt's safety ensured with 15 minute and environmental checks. Pt currently denies SI/HI and A/V hallucinations. Pt verbally agrees to seek staff if SI/HI or A/VH occurs and to consult with staff before acting on these thoughts. Will continue POC.

## 2018-08-09 NOTE — Progress Notes (Signed)
Adult Psychoeducational Group Note  Date:  08/09/2018 Time:  11:48 PM  Group Topic/Focus:  Wrap-Up Group:   The focus of this group is to help patients review their daily goal of treatment and discuss progress on daily workbooks.  Participation Level:  Active  Participation Quality:  Appropriate  Affect:  Appropriate  Cognitive:  Appropriate  Insight: Appropriate  Engagement in Group:  Engaged  Modes of Intervention:  Discussion  Additional Comments:  Patient attended group and participated.   Logan Hudson W Bruchy Mikel 08/09/2018, 11:48 PM

## 2018-08-10 MED ORDER — PRAZOSIN HCL 2 MG PO CAPS
2.0000 mg | ORAL_CAPSULE | Freq: Every day | ORAL | Status: DC
  Filled 2018-08-10: qty 14
  Filled 2018-08-10: qty 1

## 2018-08-10 MED ORDER — CITALOPRAM HYDROBROMIDE 20 MG PO TABS: 20.0000 mg | ORAL_TABLET | Freq: Every day | ORAL | 1 refills | Status: DC

## 2018-08-10 MED ORDER — PRAZOSIN HCL 2 MG PO CAPS: 2.0000 mg | ORAL_CAPSULE | Freq: Every day | ORAL | 1 refills | Status: DC

## 2018-08-10 MED ORDER — QUETIAPINE FUMARATE 50 MG PO TABS
50.0000 mg | ORAL_TABLET | Freq: Every day | ORAL | Status: DC
  Filled 2018-08-10 (×2): qty 1

## 2018-08-10 MED ORDER — CITALOPRAM HYDROBROMIDE 20 MG PO TABS
20.0000 mg | ORAL_TABLET | Freq: Every day | ORAL | Status: DC
  Filled 2018-08-10 (×3): qty 1

## 2018-08-10 MED ORDER — QUETIAPINE FUMARATE 50 MG PO TABS: 50.0000 mg | ORAL_TABLET | Freq: Every day | ORAL | 2 refills | Status: DC

## 2018-08-10 MED ORDER — BUSPIRONE HCL 15 MG PO TABS: 15.0000 mg | ORAL_TABLET | Freq: Two times a day (BID) | ORAL | 2 refills | Status: DC

## 2018-08-10 NOTE — Discharge Instructions (Signed)
CSW LEFT VOICEMAIL FOR [facility] TO SCHEDULE AN APPOINTMENT WHEN THEY RESUME SERVICES.  CSW WILL CALL PATIENT BACK WITH HIS APPOINTMENT.

## 2018-08-10 NOTE — Progress Notes (Signed)
Patient ID: Logan [name], male   DOB: [birth year], 53 y.o.   MRN: [id number]  Nursing Progress Note [id number]  On initial approach, patient is seen up in the milieu. Patient presents calm, pleasant and cooperative. Patient compliant with scheduled medications. Patient currently denies SI/HI/AVH. Patient reports he would like to discharge by 9:00 AM because his boss is picking him up for work.   Patient is educated about and provided medication per provider's orders. Patient safety maintained with q15 min safety checks and low fall risk precautions. Emotional support given, 1:1 interaction, and active listening provided. Patient encouraged to attend meals, groups, and work on treatment plan and goals. Labs, vital signs and patient behavior monitored throughout shift.   Patient contracts for safety with staff. Patient remains safe on the unit at this time and agrees to come to staff with any issues/concerns. Patient is interacting with peers appropriately on the unit. Will continue to support and monitor.   Patient's self-inventory sheet Rated Energy Level  Normal  Rated Sleep  Good  Rated Appetite  Fair  Rated Anxiety (0-10)  5  Rated Hopelessness (0-10)  5  Rated Depression (0-10)  5  Daily Goal  "stay home, stay clean"  Any Additional Comments:

## 2018-08-10 NOTE — Progress Notes (Signed)
  [facility] Adult Case Management Discharge Plan :  Will you be returning to the same living situation after discharge:  Yes,  home At discharge, do you have transportation home?: Yes,  boss picking up at 9:15am Do you have the ability to pay for your medications: Yes,  income from employment  Release of information consent forms completed and in the chart;  Patient's signature needed at discharge.  Patient to Follow up at: CSW LEFT VOICEMAIL FOR [facility] TO SCHEDULE AN APPOINTMENT WHEN THEY RESUME SERVICES. CSW WILL CALL PATIENT BACK WITH HIS APPOINTMENT.  Next level of care provider has access to [facility] Link:no  Safety Planning and Suicide Prevention discussed: Yes,  with patient  Have you used any form of tobacco in the last 30 days? (Cigarettes, Smokeless Tobacco, Cigars, and/or Pipes): No  Has patient been referred to the Quitline?: N/A patient is not a smoker  Patient has been referred for addiction treatment: Yes  Darreld Mclean, LCSWA 08/10/2018, 9:15 AM

## 2018-08-10 NOTE — Progress Notes (Signed)
Patient ID: Logan [name], male   DOB: 06-10-1965, 53 y.o.   MRN: [id number]  Discharge Note  Patient denies SI/HI and states readiness for discharge.  Written and verbal discharge instructions reviewed with the patient. Patient accepting to information and verbalized understanding with no concerns. All belongings returned to patient from the unit and secured lockers. Patient has completed their Suicide Safety Plan and has been provided Suicide Prevention resources. Patient provided an opportunity to complete and return Patient Satisfaction Survey.   Patient was safely escorted to the lobby for discharge. Patient discharged from [facility] with medication samples, prescriptions, personal belongings, follow-up appointment in place and discharge paperwork.

## 2018-08-10 NOTE — B[facility] Suicide Risk Assessment (Signed)
[facility] Discharge Suicide Risk Assessment   Principal Problem: Severity of depressive symptoms Discharge Diagnoses: Active Problems:   MDD (major depressive disorder), recurrent episode (HCC)   Total Time spent with patient: 45 minutes Currently at discharge is alert oriented and cooperative without thoughts of harming self or others contracting fully affect appropriate speech normal rate and tone eye contact good no psychosis  Mental Status Per Nursing Assessment::   On Admission:  Suicidal ideation indicated by patient  Demographic Factors:  Male and Caucasian  Loss Factors: NA  Historical Factors: NA  Risk Reduction Factors:   Sense of responsibility to family and Religious beliefs about death  Continued Clinical Symptoms:  Depression:   Severe  Cognitive Features That Contribute To Risk:  None    Suicide Risk:  Minimal: No identifiable suicidal ideation.  Patients presenting with no risk factors but with morbid ruminations; may be classified as minimal risk based on the severity of the depressive symptoms    Plan Of Care/Follow-up recommendations:  Activity:  full  Jezelle Gullick, MD 08/10/2018, 8:21 AM

## 2018-08-10 NOTE — Progress Notes (Signed)
Recreation Therapy Notes  Date:  3.18.20 Time: 0930 Location: [address] Dayroom  Group Topic: Stress Management  Goal Area(s) Addresses:  Patient will identify positive stress management techniques. Patient will identify benefits of using stress management post d/c.  Behavioral Response:  Engaged  Intervention:  Stress Management  Activity :  Meditation.  LRT introduced the stress management technique of meditation.  LRT played a meditation that focused on being resilient.  Patients were to listen and follow along as the meditation played to engage.  Education:  Stress Management, Discharge Planning.   Education Outcome: Acknowledges Education  Clinical Observations/Feedback:  Pt participated in group session.     Caroll Rancher, LRT/CTRS         Lillia Abed, Micole Delehanty A 08/10/2018 11:10 AM

## 2018-08-10 NOTE — BHH Group Notes (Signed)
Occupational Therapy Group Note  Date:  08/10/2018 Time:  12:28 PM  Group Topic/Focus:  Stress Management  Participation Level:  Minimal  Participation Quality:  Attentive  Affect:  Flat  Cognitive:  Appropriate  Insight: Improving  Engagement in Group:  Limited  Modes of Intervention:  Activity, Discussion, Education and Socialization  Additional Comments:    S: none offered this date  O: OT tx with focus on stress management this date. Negative, positive, and relapse prevention plans highlighted. Pt asked to share personal experiences and plans as well.   A: Pt presents with flat affect, attentive in session but not offering any subjective at this time. He eventually left group early.  P: OT Group will be x1 per week while pt inpatient  Dalphine Handing, MSOT, OTR/L Behavioral Health OT/ Acute Relief OT PHP Office: [phone]  Dalphine Handing 08/10/2018, 12:28 PM

## 2018-08-15 NOTE — Discharge Summary (Signed)
Physician Discharge Summary Note  Patient:  Logan [name] is an 53 y.o., male MRN:  [id number] DOB:  12/10/1965 Patient phone:  [phone] (home)  Patient address:   [address] [place] [address],  Total Time spent with patient: 45 minutes  Date of Admission:  08/06/2018 Date of Discharge: 3/18  Reason for Admission:  History of Present Illness: 53 year old male, presented to ED voluntarily reporting worsening depression, x 2 weeks, but particularly so over recent days, with recent suicidal thoughts , thoughts of walking into traffic . Also reports auditory hallucinations , which he describes as telling him he should kill self because life is not worth living . He reports significant stressors- marital discord/separation ( states his wife and him have repeatedly tried to get back together without success and that she left him again recently) , being off psychiatric medications x several weeks, financial stressors, relapse on cocaine . Patient reports history of Cocaine Abuse , states he had been sober for several months until he relapsed earlier this week.    Principal Problem: Depression and drug relapse Discharge Diagnoses: Active Problems:   MDD (major depressive disorder), recurrent episode (HCC) Past Medical History:  Past Medical History:  Diagnosis Date  . Cocaine dependence with cocaine-induced mood disorder (HCC)   . Depression   . 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 Schizoaffective disorder, bipolar type (HCC) 06/22/2017  . Suicide attempt [facility])    History reviewed. No pertinent surgical history. Family History: History reviewed. No pertinent family history. Family Psychiatric  History: neg Social History:  Social History   Substance and Sexual Activity  Alcohol Use Yes   Comment: "couple of 40's a week" as of 10/15/17     Social History   Substance and Sexual Activity  Drug Use Yes  . Types:  Cocaine, "Crack" cocaine   Comment: today     Social History   Socioeconomic History  . Marital status: Widowed    Spouse name: Not on file  . Number of children: Not on file  . Years of education: Not on file  . Highest education level: Not on file  Occupational History  . Not on file  Social Needs  . Financial resource strain: Not on file  . Food insecurity:    Worry: Not on file    Inability: Not on file  . Transportation needs:    Medical: Not on file    Non-medical: Not on file  Tobacco Use  . Smoking status: Former Games developer  . Smokeless tobacco: Never Used  Substance and Sexual Activity  . Alcohol use: Yes    Comment: "couple of 40's a week" as of 10/15/17  . Drug use: Yes    Types: Cocaine, "Crack" cocaine    Comment: today   . Sexual activity: Yes  Lifestyle  . Physical activity:    Days per week: Not on file    Minutes per session: Not on file  . Stress: Not on file  Relationships  . Social connections:    Talks on phone: Not on file    Gets together: Not on file    Attends religious service: Not on file    Active member of club or organization: Not on file    Attends meetings of clubs or organizations: Not on file    Relationship status: Not on file  Other Topics Concern  . Not on file  Social History Narrative  . Not on  file    Hospital Course:   Patient generally stayed to himself stayed in bed and displayed no dangerous behaviors here he tended to dictate his treatment and felt he knew when he was ready to go he was engage in cognitive therapy rehab based therapy and antidepressants were adjusted.  By the date of discharge informed that he had no cravings no thoughts of harming self no thoughts of harming others and felt he was ready to go no psychosis or seizure activity noted while here  Physical Findings: AIMS: Facial and Oral Movements Muscles of Facial Expression: None, normal Lips and Perioral Area: None, normal Jaw: None, normal Tongue: None,  normal,Extremity Movements Upper (arms, wrists, hands, fingers): None, normal Lower (legs, knees, ankles, toes): None, normal, Trunk Movements Neck, shoulders, hips: None, normal, Overall Severity Severity of abnormal movements (highest score from questions above): None, normal Incapacitation due to abnormal movements: None, normal Patient's awareness of abnormal movements (rate only patient's report): No Awareness, Dental Status Current problems with teeth and/or dentures?: No Does patient usually wear dentures?: No  CIWA:    COWS:     Musculoskeletal: Strength & Muscle Tone: within normal limits Gait & Station: normal Patient leans: N/A  Psychiatric Specialty Exam: Physical Exam  ROS  Blood pressure 110/67, pulse 72, temperature 97.8 F (36.6 C), temperature source Oral, resp. rate 16, height 6' (1.829 m), weight 104.3 kg, SpO2 100 %.Body mass index is 31.19 kg/m.  General Appearance: Calm nonagitated  Eye Contact:  Good  Speech:  Clear and Coherent  Volume:  Decreased  Mood:  Dysphoric  Affect:  Constricted  Thought Process:  Coherent  Orientation:  Full (Time, Place, and Person)  Thought Content:  Tangential  Suicidal Thoughts:  No  Homicidal Thoughts:  No  Memory:  Immediate;   Good  Judgement:  Good  Insight:  Good  Psychomotor Activity:  Normal  Concentration:  Concentration: Good  Recall:  Good  Fund of Knowledge:  Good  Language:  Good  Akathisia:  Negative  Handed:  Right  AIMS (if indicated):     Assets:  Desire for Improvement Financial Resources/Insurance  ADL's:  Intact  Cognition:  WNL  Sleep:  Number of Hours: 6.75     Have you used any form of tobacco in the last 30 days? (Cigarettes, Smokeless Tobacco, Cigars, and/or Pipes): No  Has this patient used any form of tobacco in the last 30 days? (Cigarettes, Smokeless Tobacco, Cigars, and/or Pipes) Yes, No  Blood Alcohol level:  Lab Results  Component Value Date   ETH <10 08/05/2018   ETH <10  10/24/2017    Metabolic Disorder Labs:  Lab Results  Component Value Date   HGBA1C 5.1 08/08/2018   MPG 99.67 08/08/2018   MPG 105.41 06/23/2017   No results found for: PROLACTIN Lab Results  Component Value Date   CHOL 155 08/08/2018   TRIG 105 08/08/2018   HDL 41 08/08/2018   CHOLHDL 3.8 08/08/2018   VLDL 21 08/08/2018   LDLCALC 93 08/08/2018   LDLCALC 71 06/23/2017    See Psychiatric Specialty Exam and Suicide Risk Assessment completed by Attending Physician prior to discharge.  Discharge destination:  Home  Is patient on multiple antipsychotic therapies at discharge:  No   Has Patient had three or more failed trials of antipsychotic monotherapy by history:  No  Recommended Plan for Multiple Antipsychotic Therapies: NA   Allergies as of 08/10/2018      Reactions   Asa [aspirin] Hives  Nicotine Other (See Comments)   migraine   Paxil [paroxetine Hcl] Other (See Comments)   shaking   Tylenol [acetaminophen] Hives      Medication List    STOP taking these medications   traZODone 50 MG tablet Commonly known as:  DESYREL     TAKE these medications     Indication  busPIRone 15 MG tablet Commonly known as:  BUSPAR Take 1 tablet (15 mg total) by mouth 2 (two) times daily. What changed:    medication strength  how much to take  Indication:  Anxiety Disorder   citalopram 20 MG tablet Commonly known as:  CELEXA Take 1 tablet (20 mg total) by mouth daily.  Indication:  Depression   prazosin 2 MG capsule Commonly known as:  MINIPRESS Take 1 capsule (2 mg total) by mouth at bedtime.  Indication:  High Blood Pressure Disorder   QUEtiapine 50 MG tablet Commonly known as:  SEROQUEL Take 1 tablet (50 mg total) by mouth at bedtime. What changed:    medication strength  how much to take  Indication:  Generalized Anxiety Disorder      Follow-up Information    [facility] Follow up.   Why:  CSW attempting to schedule appointment for patient, [facility] walk  in hours are currently suspended and [facility] is not scheduling new patient appointments at this time. Patient granted CSW permission to call his cell phone at a later date with an appt. Contact information: [address] [place] [address] [phone]        [facility] Follow up.   Specialty:  Professional Counselor Why:  New appointments and walk in hours are currently suspended.  Contact information: [facility] [place] [address] [name] [address] [phone]           Signed: Malvin Johns, MD 08/15/2018, 2:56 PM

## 2018-08-16 ENCOUNTER — Encounter (HOSPITAL_COMMUNITY): Payer: Self-pay | Admitting: Emergency Medicine

## 2018-08-16 ENCOUNTER — Other Ambulatory Visit: Payer: Self-pay

## 2018-08-16 ENCOUNTER — Emergency Department (HOSPITAL_COMMUNITY)
Admission: EM | Admit: 2018-08-16 | Discharge: 2018-08-17 | Disposition: A | Discharge status: Home / Self Care | Payer: Self-pay | Attending: Emergency Medicine | Admitting: Emergency Medicine

## 2018-08-16 DIAGNOSIS — F419 Anxiety disorder, unspecified: Secondary | ICD-10-CM | POA: Insufficient documentation

## 2018-08-16 DIAGNOSIS — R45851 Suicidal ideations: Secondary | ICD-10-CM | POA: Insufficient documentation

## 2018-08-16 DIAGNOSIS — Z046 Encounter for general psychiatric examination, requested by authority: Secondary | ICD-10-CM | POA: Insufficient documentation

## 2018-08-16 DIAGNOSIS — F329 Major depressive disorder, single episode, unspecified: Secondary | ICD-10-CM | POA: Insufficient documentation

## 2018-08-16 DIAGNOSIS — Z590 Homelessness: Secondary | ICD-10-CM | POA: Insufficient documentation

## 2018-08-16 DIAGNOSIS — E860 Dehydration: Secondary | ICD-10-CM | POA: Insufficient documentation

## 2018-08-16 DIAGNOSIS — Z87891 Personal history of nicotine dependence: Secondary | ICD-10-CM | POA: Insufficient documentation

## 2018-08-16 DIAGNOSIS — F1490 Cocaine use, unspecified, uncomplicated: Secondary | ICD-10-CM | POA: Insufficient documentation

## 2018-08-16 LAB — COMPREHENSIVE METABOLIC PANEL
ALT: 37 U/L (ref 0–44)
ALT: 30 U/L (ref 0–44)
AST: 48 U/L — ABNORMAL HIGH (ref 15–41)
AST: 34 U/L (ref 15–41)
Albumin: 4.6 g/dL (ref 3.5–5.0)
Albumin: 3.5 g/dL (ref 3.5–5.0)
Alkaline Phosphatase: 91 U/L (ref 38–126)
Alkaline Phosphatase: 77 U/L (ref 38–126)
Anion gap: 22 — ABNORMAL HIGH (ref 5–15)
Anion gap: 8 (ref 5–15)
BILIRUBIN TOTAL: 1 mg/dL (ref 0.3–1.2)
BUN: 44 mg/dL — ABNORMAL HIGH (ref 6–20)
BUN: 36 mg/dL — AB (ref 6–20)
CO2: 14 mmol/L — ABNORMAL LOW (ref 22–32)
CO2: 22 mmol/L (ref 22–32)
Calcium: 9.5 mg/dL (ref 8.9–10.3)
Calcium: 8 mg/dL — ABNORMAL LOW (ref 8.9–10.3)
Chloride: 99 mmol/L (ref 98–111)
Chloride: 106 mmol/L (ref 98–111)
Creatinine, Ser: 2.4 mg/dL — ABNORMAL HIGH (ref 0.61–1.24)
Creatinine, Ser: 1.76 mg/dL — ABNORMAL HIGH (ref 0.61–1.24)
GFR calc Af Amer: 35 mL/min — ABNORMAL LOW (ref >60)
GFR calc Af Amer: 50 mL/min — ABNORMAL LOW (ref >60)
GFR calc non Af Amer: 30 mL/min — ABNORMAL LOW (ref >60)
GFR calc non Af Amer: 43 mL/min — ABNORMAL LOW (ref >60)
GLUCOSE: 131 mg/dL — AB (ref 70–99)
Glucose, Bld: 167 mg/dL — ABNORMAL HIGH (ref 70–99)
Potassium: 3.9 mmol/L (ref 3.5–5.1)
Potassium: 3.4 mmol/L — ABNORMAL LOW (ref 3.5–5.1)
Sodium: 135 mmol/L (ref 135–145)
Sodium: 136 mmol/L (ref 135–145)
Total Bilirubin: 0.6 mg/dL (ref 0.3–1.2)
Total Protein: 8.4 g/dL — ABNORMAL HIGH (ref 6.5–8.1)
Total Protein: 6.4 g/dL — ABNORMAL LOW (ref 6.5–8.1)

## 2018-08-16 LAB — CBC WITH DIFFERENTIAL/PLATELET
ABS IMMATURE GRANULOCYTES: 0.04 10*3/uL (ref 0.00–0.07)
Basophils Absolute: 0 10*3/uL (ref 0.0–0.1)
Basophils Relative: 0 %
Eosinophils Absolute: 0 10*3/uL (ref 0.0–0.5)
Eosinophils Relative: 0 %
HCT: 48.2 % (ref 39.0–52.0)
Hemoglobin: 15.8 g/dL (ref 13.0–17.0)
Immature Granulocytes: 0 %
Lymphocytes Relative: 16 %
Lymphs Abs: 1.7 10*3/uL (ref 0.7–4.0)
MCH: 28.4 pg (ref 26.0–34.0)
MCHC: 32.8 g/dL (ref 30.0–36.0)
MCV: 86.7 fL (ref 80.0–100.0)
Monocytes Absolute: 0.6 10*3/uL (ref 0.1–1.0)
Monocytes Relative: 6 %
NEUTROS ABS: 8.1 10*3/uL — AB (ref 1.7–7.7)
Neutrophils Relative %: 78 %
Platelets: 469 10*3/uL — ABNORMAL HIGH (ref 150–400)
RBC: 5.56 MIL/uL (ref 4.22–5.81)
RDW: 12.8 % (ref 11.5–15.5)
WBC: 10.5 10*3/uL (ref 4.0–10.5)
nRBC: 0 % (ref 0.0–0.2)

## 2018-08-16 LAB — RAPID URINE DRUG SCREEN, HOSP PERFORMED
Amphetamines: NOT DETECTED
Barbiturates: NOT DETECTED
Benzodiazepines: NOT DETECTED
Cocaine: POSITIVE — AB
Opiates: NOT DETECTED
Tetrahydrocannabinol: NOT DETECTED

## 2018-08-16 LAB — ETHANOL

## 2018-08-16 MED ORDER — SODIUM CHLORIDE 0.9 % IV BOLUS
1000.0000 mL | Freq: Once | INTRAVENOUS | Status: AC
  Administered 2018-08-16: 1000 mL via INTRAVENOUS

## 2018-08-16 NOTE — ED Triage Notes (Signed)
Pt brought in by [employer] from [facility] for needing a place to go to get out of the rain and cold. He reported to [employer] that he was jumping out in front of cars and needed to go to [employer].

## 2018-08-16 NOTE — ED Notes (Signed)
Admitted to room 30 from triage after her was brought in by [facility] from the [employer] because he was having SI and reported he had been trying to jump in front of cars to kill self. When Clinical research associate spoke with him he acknowledged he was trying to get hit by a car and to kill self on crack. He states he should have taken rehab when it was offered to him a week ago. States he uses alcohol and crack but hasnt had any alcohol for one week. He is currently not experiencing any withdrawal sx. He is hungry, states he hasnt eaten for three days. He was given a sandwich, drink and crackers. He is pleasant and cooperative. He states he is able to contract for safety while here and would like to stay for several days. He has a trailer he lives in but states since his wife kicked him out a month ago and his mom died six years ago its been hard for him, he has no supports and is depressed.

## 2018-08-16 NOTE — ED Notes (Signed)
One white hospital bag unchecked in locker # 30.

## 2018-08-16 NOTE — ED Notes (Signed)
Second bag of IV fluid hung by Zen[place] from ED. Once this bag is completed a BMP is ordered for him.

## 2018-08-16 NOTE — ED Notes (Signed)
Order for him to have sodium chloride in an IV, 1000 ml. Nurse from ED came over to TCU to start IV.

## 2018-08-16 NOTE — ED Provider Notes (Signed)
53 year old male received at sign out from [facility] pending repeat metabolic panel. Per her HPI:   "Logan [name] is a 53 y.o. male who presents with SI. PMH significant for cocaine abuse, depression, schizoaffective d/o. He states that he's been smoking crack for three days and hasn't had anything to eat or drink. He feels suicidal and plans to jump in front of cars. He stayed in behavioral health a couple weeks ago and they offered him rehab but he didn't feel like he needed it then but he does now. He relapsed because his wife, who has left him, told him she was coming back but then she didn't. He feels lightheaded like he's going to pass out because he hasn't had any food or water. He called police from [employer] because he was feeling cold and suicidal. He denies headache, LOC, chest pain, SOB, abdominal pain. He is currently living in a camper."  Physical Exam  BP 133/79 (BP Location: Right Arm)   Pulse 88   Temp 98.4 F (36.9 C) (Oral)   Resp 18   SpO2 96%   Physical Exam  Resting comfortably. Easy to rouse. NAD.   ED Course/Procedures     Procedures  MDM   53 year old male received at sign out from [place] had a repeat metabolic panel.  Labs are notable for UDS positive for cocaine.  Initial metabolic panel with creatinine of 2.4.  He had an anion gap of 22 with a bicarb of 14.  AST mildly elevated at 48.  Glucose 167.  Thrombocytosis of 469, thought to be reactive.  Ethanol level was negative.  The patient also endorses SI with a plan.  Electrolyte abnormalities are likely secondary to decreased p.o. intake for the last 3 days and drug use. After 2 liters of IVFs repeat metabolic panel with creatinine of 1.76, which appears to be close to the patient's baseline.  Anion gap is normalized to 8 and bicarb is 22.  He remains hemodynamically stable. He is voluntary.   Pt medically cleared at this time. Psych hold orders and home med orders placed. TTS consult pending;  please see psych team notes for further documentation of care/dispo. Pt stable at time of med clearance.     Frederik Pear A, PA-C 08/17/18 0457    Zadie Rhine, MD 08/17/18 [phone]

## 2018-08-16 NOTE — ED Notes (Signed)
Bed: WLPT3 Expected date:  Expected time:  Means of arrival:  Comments: 

## 2018-08-16 NOTE — ED Notes (Signed)
Bed: WA29 Expected date:  Expected time:  Means of arrival:  Comments: 

## 2018-08-16 NOTE — ED Provider Notes (Signed)
[facility]-EMERGENCY DEPT Provider Note   CSN: [id number] Arrival date & time: 08/16/18  1843    History   Chief Complaint Chief Complaint  Patient presents with  . Suicidal    HPI Logan [name] is a 53 y.o. male who presents with SI. PMH significant for cocaine abuse, depression, schizoaffective d/o. He states that he's been smoking crack for three days and hasn't had anything to eat or drink. He feels suicidal and plans to jump in front of cars. He stayed in behavioral health a couple weeks ago and they offered him rehab but he didn't feel like he needed it then but he does now. He relapsed because his wife, who has left him, told him she was coming back but then she didn't. He feels lightheaded like he's going to pass out because he hasn't had any food or water. He called police from [employer] because he was feeling cold and suicidal. He denies headache, LOC, chest pain, SOB, abdominal pain. He is currently living in a camper.     HPI  Past Medical History:  Diagnosis Date  . Cocaine dependence with cocaine-induced mood disorder (HCC)   . Depression   . 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 Schizoaffective disorder, bipolar type (HCC) 06/22/2017  . Suicide attempt [facility])     Patient Active Problem List   Diagnosis Date Noted  . MDD (major depressive disorder), recurrent episode (HCC) 08/06/2018  . AKI (acute kidney injury) (HCC) 10/15/2017  . Substance induced mood disorder (HCC) 09/28/2017  . Severe recurrent major depressive disorder with psychotic features (HCC) 09/15/2017  . Suicidal ideation 03/13/2016  . Cocaine abuse without complication (HCC) 05/23/2011    History reviewed. No pertinent surgical history.      Home Medications    Prior to Admission medications   Medication Sig Start Date End Date Taking? Authorizing Provider  busPIRone (BUSPAR) 15 MG tablet Take 1 tablet (15  mg total) by mouth 2 (two) times daily. 08/10/18   Malvin Johns, MD  citalopram (CELEXA) 20 MG tablet Take 1 tablet (20 mg total) by mouth daily. 08/10/18   Malvin Johns, MD  prazosin (MINIPRESS) 2 MG capsule Take 1 capsule (2 mg total) by mouth at bedtime. 08/10/18   Malvin Johns, MD  QUEtiapine (SEROQUEL) 50 MG tablet Take 1 tablet (50 mg total) by mouth at bedtime. 08/10/18   Malvin Johns, MD    Family History No family history on file.  Social History Social History   Tobacco Use  . Smoking status: Former Games developer  . Smokeless tobacco: Never Used  Substance Use Topics  . Alcohol use: Yes    Comment: "couple of 40's a week" as of 10/15/17  . Drug use: Yes    Types: Cocaine, "Crack" cocaine    Comment: today      Allergies   Asa [aspirin]; Nicotine; Paxil [paroxetine hcl]; and Tylenol [acetaminophen]   Review of Systems Review of Systems  Constitutional: Negative for fever.  Respiratory: Negative for shortness of breath.   Cardiovascular: Negative for chest pain.  Gastrointestinal: Negative for abdominal pain.  Neurological: Positive for light-headedness. Negative for syncope.  Psychiatric/Behavioral: Positive for dysphoric mood and suicidal ideas. Negative for self-injury. The patient is nervous/anxious.   All other systems reviewed and are negative.    Physical Exam Updated Vital Signs BP (!) 124/92 (BP Location: Left Arm)   Pulse (!) 107   Temp (!) 97.4 F (36.3 C) (  Oral)   Resp (!) 24   SpO2 100%   Physical Exam Vitals signs and nursing note reviewed.  Constitutional:      General: He is not in acute distress.    Appearance: Normal appearance. He is well-developed. He is obese. He is ill-appearing (chronically ill).  HENT:     Head: Normocephalic and atraumatic.  Eyes:     General: No scleral icterus.       Right eye: No discharge.        Left eye: No discharge.     Conjunctiva/sclera: Conjunctivae normal.     Pupils: Pupils are equal, round, and reactive  to light.  Neck:     Musculoskeletal: Normal range of motion.  Cardiovascular:     Rate and Rhythm: Normal rate and regular rhythm.  Pulmonary:     Effort: Pulmonary effort is normal. Tachypnea present. No respiratory distress.     Breath sounds: Normal breath sounds.  Abdominal:     General: There is no distension.     Palpations: Abdomen is soft.     Tenderness: There is no abdominal tenderness.  Skin:    General: Skin is dry.     Comments: Skin is cool  Neurological:     Mental Status: He is alert and oriented to person, place, and time.  Psychiatric:        Attention and Perception: Attention normal.        Mood and Affect: Mood is anxious.        Speech: Speech normal.        Behavior: Behavior normal. Behavior is cooperative.        Thought Content: Thought content is not paranoid or delusional. Thought content includes suicidal ideation. Thought content does not include homicidal ideation. Thought content includes suicidal plan. Thought content does not include homicidal plan.      ED Treatments / Results  Labs (all labs ordered are listed, but only abnormal results are displayed) Labs Reviewed  COMPREHENSIVE METABOLIC PANEL - Abnormal; Notable for the following components:      Result Value   CO2 14 (*)    Glucose, Bld 167 (*)    BUN 44 (*)    Creatinine, Ser 2.40 (*)    Total Protein 8.4 (*)    AST 48 (*)    GFR calc non Af Amer 30 (*)    GFR calc Af Amer 35 (*)    Anion gap 22 (*)    All other components within normal limits  RAPID URINE DRUG SCREEN, HOSP PERFORMED - Abnormal; Notable for the following components:   Cocaine POSITIVE (*)    All other components within normal limits  CBC WITH DIFFERENTIAL/PLATELET - Abnormal; Notable for the following components:   Platelets 469 (*)    Neutro Abs 8.1 (*)    All other components within normal limits  ETHANOL  COMPREHENSIVE METABOLIC PANEL    EKG EKG Interpretation  Date/Time:  Tuesday August 16 2018  19:39:01 EDT Ventricular Rate:  100 PR Interval:    QRS Duration: 98 QT Interval:  356 QTC Calculation: 460 R Axis:   82 Text Interpretation:  Sinus tachycardia No STEMI.  Confirmed by Alona Bene [phone]) on 08/16/2018 7:57:17 PM   Radiology No results found.  Procedures Procedures (including critical care time)  Medications Ordered in ED Medications  sodium chloride 0.9 % bolus 1,000 mL (0 mLs Intravenous Stopped 08/16/18 2141)  sodium chloride 0.9 % bolus 1,000 mL (1,000 mLs Intravenous New  Bag/Given (Non-Interop) 08/16/18 2142)     Initial Impression / Assessment and Plan / ED Course  I have reviewed the triage vital signs and the nursing notes.  Pertinent labs & imaging results that were available during my care of the patient were reviewed by me and considered in my medical decision making (see chart for details).  53 year old male presents with suicidal ideation after a 3-day crack binge.  He is mildly hypertensive, tachypneic, tachycardic on initial exam.  He reports feeling lightheaded.  He states he has not ate or drink anything in 3 days.  Medical clearance labs were obtained which shows elevated BUN and SCr (44/2.4), bicarb of 14, anion gap of 22.  Likely from dehydration.  UDS is positive for cocaine.  Alcohol levels are 0.  Discussed with Dr. Jacqulyn Bath.  Will give patient 2 L of fluid and repeat labs.    11:25 PM His vital signs are improved after fluids.  Repeat labs are pending.  Care signed out to me [facility] will follow.  Once medically cleared patient can have TTS consult.  Final Clinical Impressions(s) / ED Diagnoses   Final diagnoses:  Dehydration  Suicidal ideation  Crack cocaine use    ED Discharge Orders    None       Bethel Born, PA-C 08/16/18 2326    Maia Plan, MD 08/17/18 1018

## 2018-08-16 NOTE — ED Triage Notes (Signed)
Pt adds that he been smoking crack all day trying to OD and kill himself as well as jumping in traffic. reports lives in a camper behind a bar. His wife left him about month ago and things have been going down hill ever since. C/o foot pain from walking.

## 2018-08-17 ENCOUNTER — Encounter (HOSPITAL_COMMUNITY): Payer: Self-pay | Admitting: Behavioral Health

## 2018-08-17 MED ORDER — ALUM & MAG HYDROXIDE-SIMETH 200-200-20 MG/5ML PO SUSP: 30.0000 mL | Freq: Four times a day (QID) | ORAL | Status: DC | PRN

## 2018-08-17 MED ORDER — ONDANSETRON HCL 4 MG PO TABS: 4.0000 mg | ORAL_TABLET | Freq: Three times a day (TID) | ORAL | Status: DC | PRN

## 2018-08-17 NOTE — ED Notes (Signed)
Bed: WBH37 Expected date:  Expected time:  Means of arrival:  Comments: 

## 2018-08-17 NOTE — Patient Outreach (Signed)
ED Peer Support Specialist Patient Intake (Complete at intake & 30-60 Day Follow-up)  Name: [facility]  MRN: [id number]  Age: 53 y.o.   Date of Admission: 08/17/2018  Intake: Initial Comments:      Primary Reason Admitted:  Depression, crack cocaine use, SI  Lab values: Alcohol/ETOH: Negative Positive UDS? Yes Amphetamines: No Barbiturates: No Benzodiazepines: No Cocaine: Yes Opiates: No Cannabinoids: No  Demographic information: Gender: Male Ethnicity: White Marital Status: Separated Insurance Status: Uninsured/Self-pay Ecologist (Work Neurosurgeon, Physicist, medical, etc.: No Lives with: Alone Living situation: Other (comment)(Camper)  Reported Patient History: Patient reported health conditions: Schizoaffective disorder, Depression Patient aware of HIV and hepatitis status: Yes (comment)(Negative)  In past year, has patient visited ED for any reason? Yes  Number of ED visits: 4  Reason(s) for visit: lumbar contusion, poly substance use 4x, suicidal ideation 3x  In past year, has patient been hospitalized for any reason? Yes  Number of hospitalizations: 4  Reason(s) for hospitalization: [facility] three times on 09/14/17, 10/15/17, and 08/06/18 for suicidal ideation and poly substance use, medical admission for acute kidney injury on 10/15/17  In past year, has patient been arrested? No  Number of arrests:    Reason(s) for arrest:    In past year, has patient been incarcerated? No  Number of incarcerations:    Reason(s) for incarceration:    In past year, has patient received medication-assisted treatment? No  In past year, patient received the following treatments:    In past year, has patient received any harm reduction services? No  Did this include any of the following?    In past year, has patient received care from a mental health provider for diagnosis other than SUD? No  In past year, is this first time patient has  overdosed? (has not overdosed)  Number of past overdoses:    In past year, is this first time patient has been hospitalized for an overdose? (has not overdosed)  Number of hospitalizations for overdose(s):    Is patient currently receiving treatment for a mental health diagnosis? No  Patient reports experiencing difficulty participating in SUD treatment: No    Most important reason(s) for this difficulty?    Has patient received prior services for treatment? No  In past, patient has received services from following agencies:    Plan of Care:  Suggested follow up at these agencies/treatment centers: [facility])(Patient interested in residential treatment bed for his crack cocaine use. [facility] will work on these referrals. )  Other information: [facility] met with the patient to provide substance use recovery support with lived recovery experience from [place]. [facility]PSS contacted [facility], and they currently do not have any residential substance use treatment beds. [facility] is doing a walk in clinic starting at 7:45am. [facility] will provide the patient with follow up information for these resources as well as follow up information for other substance use recovery resources including [facility] contact information.    Mason Jim, CPSS  08/17/2018 11:35 AM

## 2018-08-17 NOTE — BHH Counselor (Signed)
Per [place], NT pt is still sleeping. Pt's assessment will be completed once pt is roused, alert and able to engage. Pt to be assessed during day-shift.   Redmond Pulling, MS, [facility], [facility] Triage Specialist [phone]

## 2018-08-17 NOTE — Progress Notes (Addendum)
Patient ID: [name]., male   DOB: [birth year], 53 y.o.   MRN: [id number]  Pt was seen and chart reviewed with treatment team and Dr Sharma Covert. Pt was seen and chart reviewed with treatment team and Dr Sharma Covert. Pt is a 53 year old male. Pt was discharged from [facility] on 3-18 after a 4 day treatment stay for depression and drug relapse. He was discharged on Buspar, Celexa, minipress, and seroquel. Pt was instructed to follow up with [facility], which he did not do. He will be seen by Peer Support for possible rehab options available to him. Pt has been living in a camper since his wife left him. Pt's UDS positive for cocaine, BAL negative. Pt is psychiatrically clear.   Laveda Abbe, FNP-C 08/17/2018      [id number]   Patient's chart reviewed and case discussed with the physician extender and developed treatment plan. Reviewed the information documented and agree with the treatment plan.  Juanetta Beets, DO 08/17/18 3:09 PM
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
## 2018-08-17 NOTE — ED Notes (Signed)
Pt A&O x 3, no distress noted, calm & cooperative, eating breakfast at present. H.L. to Left forearm intact and patent. Remains SI, no plan.  Monitoring for safety.  Sitter at bedside with patient.

## 2018-08-17 NOTE — BHH Counselor (Signed)
Clinician observed the pt still sleeping. Pt's assessment will be completed once pt is roused, alert and able to engage. Clinician will continued to monitor.    Redmond Pulling, MS, [facility], [facility] Triage Specialist [phone]

## 2018-08-17 NOTE — BH Assessment (Signed)
[facility] Assessment Progress Note  Per Elta Guadeloupe, FNP, this pt does not require psychiatric hospitalization at this time.  Pt is to be discharged from [facility] with recommendation to follow up with [facility] for his mental health needs.  He is also to be provided with referral information for area substance abuse treatment providers.  These resources have been included in pt's discharge instructions.  Pt would also benefit from seeing Peer Support Specialists; they will be asked to speak to pt.  Pt's nurse, Joanie Coddington, has been notified.  Doylene Canning, MA Triage Specialist [phone]

## 2018-08-17 NOTE — Discharge Instructions (Signed)
For your mental health needs, you are advised to follow up with [facility].  Call them at your earliest opportunity to schedule an intake appointment:       [facility]      [address]      [place], [place] [address]      [phone]      Crisis number: [phone]  To help you maintain a sober lifestyle, a substance abuse treatment program may be beneficial to you.  Contact one of the following facilities at your earliest opportunity to ask about enrolling:  RESIDENTIAL PROGRAMS:       [facility]      [address] [place], [place] [address]      [phone]       [facility]      [address] [place], [place] [address]      [phone]       [facility]      [address]      [place], [place] [address]      [phone]  OUTPATIENT PROGRAMS:       [facility]      [address], [place] [address]      [phone]      New patients are seen at the walk-in clinic Mondays, Wednesdays and Fridays, 1:00 pm - 3:00 pm

## 2018-08-17 NOTE — BHH Counselor (Signed)
Per Melina Schools, RN pt is currently sleeping. Pt's assessment will be completed once pt is roused, alert and able to engage. Clinician will continued to monitor.     Redmond Pulling, MS, [facility], [facility] Triage Specialist [phone]

## 2018-08-21 ENCOUNTER — Encounter (HOSPITAL_COMMUNITY): Payer: Self-pay | Admitting: Emergency Medicine

## 2018-08-21 ENCOUNTER — Other Ambulatory Visit: Payer: Self-pay

## 2018-08-21 ENCOUNTER — Emergency Department (HOSPITAL_COMMUNITY)
Admission: EM | Admit: 2018-08-21 | Discharge: 2018-08-22 | Disposition: A | Discharge status: Home / Self Care | Payer: Self-pay | Attending: Emergency Medicine | Admitting: Emergency Medicine

## 2018-08-21 DIAGNOSIS — F333 Major depressive disorder, recurrent, severe with psychotic symptoms: Secondary | ICD-10-CM | POA: Insufficient documentation

## 2018-08-21 DIAGNOSIS — Z87891 Personal history of nicotine dependence: Secondary | ICD-10-CM | POA: Insufficient documentation

## 2018-08-21 DIAGNOSIS — F1420 Cocaine dependence, uncomplicated: Secondary | ICD-10-CM | POA: Insufficient documentation

## 2018-08-21 DIAGNOSIS — R45851 Suicidal ideations: Secondary | ICD-10-CM | POA: Insufficient documentation

## 2018-08-21 DIAGNOSIS — F259 Schizoaffective disorder, unspecified: Secondary | ICD-10-CM | POA: Insufficient documentation

## 2018-08-21 DIAGNOSIS — F1020 Alcohol dependence, uncomplicated: Secondary | ICD-10-CM | POA: Insufficient documentation

## 2018-08-21 LAB — CBC WITH DIFFERENTIAL/PLATELET
Abs Immature Granulocytes: 0.03 10*3/uL (ref 0.00–0.07)
BASOS ABS: 0 10*3/uL (ref 0.0–0.1)
Basophils Relative: 0 %
Eosinophils Absolute: 0.1 10*3/uL (ref 0.0–0.5)
Eosinophils Relative: 1 %
HCT: 41.5 % (ref 39.0–52.0)
Hemoglobin: 13.9 g/dL (ref 13.0–17.0)
Immature Granulocytes: 0 %
Lymphocytes Relative: 21 %
Lymphs Abs: 2 10*3/uL (ref 0.7–4.0)
MCH: 28.9 pg (ref 26.0–34.0)
MCHC: 33.5 g/dL (ref 30.0–36.0)
MCV: 86.3 fL (ref 80.0–100.0)
Monocytes Absolute: 0.7 10*3/uL (ref 0.1–1.0)
Monocytes Relative: 7 %
NEUTROS ABS: 6.7 10*3/uL (ref 1.7–7.7)
Neutrophils Relative %: 71 %
Platelets: 364 10*3/uL (ref 150–400)
RBC: 4.81 MIL/uL (ref 4.22–5.81)
RDW: 12.9 % (ref 11.5–15.5)
WBC: 9.4 10*3/uL (ref 4.0–10.5)
nRBC: 0 % (ref 0.0–0.2)

## 2018-08-21 LAB — ACETAMINOPHEN LEVEL: Acetaminophen (Tylenol), Serum: <10 ug/mL — ABNORMAL LOW (ref 10–30)

## 2018-08-21 LAB — COMPREHENSIVE METABOLIC PANEL
ALT: 25 U/L (ref 0–44)
AST: 26 U/L (ref 15–41)
Albumin: 3.7 g/dL (ref 3.5–5.0)
Alkaline Phosphatase: 81 U/L (ref 38–126)
Anion gap: 10 (ref 5–15)
BUN: 14 mg/dL (ref 6–20)
CHLORIDE: 104 mmol/L (ref 98–111)
CO2: 23 mmol/L (ref 22–32)
CREATININE: 1.38 mg/dL — AB (ref 0.61–1.24)
Calcium: 8.7 mg/dL — ABNORMAL LOW (ref 8.9–10.3)
GFR calc Af Amer: >60 mL/min (ref >60)
GFR calc non Af Amer: 58 mL/min — ABNORMAL LOW (ref >60)
Glucose, Bld: 105 mg/dL — ABNORMAL HIGH (ref 70–99)
POTASSIUM: 3.7 mmol/L (ref 3.5–5.1)
Sodium: 137 mmol/L (ref 135–145)
Total Bilirubin: 0.7 mg/dL (ref 0.3–1.2)
Total Protein: 6.8 g/dL (ref 6.5–8.1)

## 2018-08-21 LAB — RAPID URINE DRUG SCREEN, HOSP PERFORMED
Amphetamines: NOT DETECTED
Barbiturates: NOT DETECTED
Benzodiazepines: NOT DETECTED
Cocaine: POSITIVE — AB
Opiates: NOT DETECTED
Tetrahydrocannabinol: NOT DETECTED

## 2018-08-21 LAB — SALICYLATE LEVEL: Salicylate Lvl: <7 mg/dL (ref 2.8–30.0)

## 2018-08-21 LAB — ETHANOL: Alcohol, Ethyl (B): <10 mg/dL (ref <10)

## 2018-08-21 MED ORDER — ONDANSETRON HCL 4 MG PO TABS: 4.0000 mg | ORAL_TABLET | Freq: Three times a day (TID) | ORAL | Status: DC | PRN

## 2018-08-21 MED ORDER — LORAZEPAM 1 MG PO TABS
0.0000 mg | ORAL_TABLET | Freq: Four times a day (QID) | ORAL | Status: DC
  Administered 2018-08-21: 1 mg via ORAL
  Filled 2018-08-21: qty 1

## 2018-08-21 MED ORDER — BUSPIRONE HCL 10 MG PO TABS
15.0000 mg | ORAL_TABLET | Freq: Two times a day (BID) | ORAL | Status: DC
  Administered 2018-08-21 – 2018-08-22 (×2): 15 mg via ORAL
  Filled 2018-08-21 (×2): qty 2

## 2018-08-21 MED ORDER — VITAMIN B-1 100 MG PO TABS
100.0000 mg | ORAL_TABLET | Freq: Every day | ORAL | Status: DC
  Administered 2018-08-22: 100 mg via ORAL
  Filled 2018-08-21: qty 1

## 2018-08-21 MED ORDER — LORAZEPAM 2 MG/ML IJ SOLN: 0.0000 mg | Freq: Four times a day (QID) | INTRAMUSCULAR | Status: DC

## 2018-08-21 MED ORDER — QUETIAPINE FUMARATE 50 MG PO TABS
50.0000 mg | ORAL_TABLET | Freq: Every day | ORAL | Status: DC
  Administered 2018-08-21: 50 mg via ORAL
  Filled 2018-08-21 (×2): qty 1

## 2018-08-21 MED ORDER — LORAZEPAM 1 MG PO TABS: 0.0000 mg | ORAL_TABLET | Freq: Two times a day (BID) | ORAL | Status: DC

## 2018-08-21 MED ORDER — ALUM & MAG HYDROXIDE-SIMETH 200-200-20 MG/5ML PO SUSP: 30.0000 mL | Freq: Four times a day (QID) | ORAL | Status: DC | PRN

## 2018-08-21 MED ORDER — LORAZEPAM 2 MG/ML IJ SOLN: 0.0000 mg | Freq: Two times a day (BID) | INTRAMUSCULAR | Status: DC

## 2018-08-21 MED ORDER — THIAMINE HCL 100 MG/ML IJ SOLN: 100.0000 mg | Freq: Every day | INTRAMUSCULAR | Status: DC

## 2018-08-21 MED ORDER — SODIUM CHLORIDE 0.9 % IV BOLUS
1000.0000 mL | Freq: Once | INTRAVENOUS | Status: AC
  Administered 2018-08-21: 1000 mL via INTRAVENOUS

## 2018-08-21 NOTE — ED Provider Notes (Signed)
[facility] EMERGENCY DEPARTMENT Provider Note   CSN: [id number] Arrival date & time: 08/21/18  2047  History   Chief Complaint Chief Complaint  Patient presents with  . Suicide Attempt    HPI Logan [name] is a 53 y.o. male with past medical history significant for polysubstance abuse, depression, schizoaffective disorder, history of suicide attempt who presents for evaluation of suicide attempt.  Patient brought in by [employer].  Patient states he has been hearing voices which have been telling him to walk out into traffic.  Patient states he recently went on a cocaine binge.  Patient states he has not been eating or drinking over the last 3 days.  States he has been to behavioral health a couple weeks ago and they offered him rehab, however he did not want at that time.  Patient denies fever, chills, nausea, vomiting, chest pain, shortness of breath, abdominal pain, diarrhea, dysuria.  Denies alcohol use or suicide attempt by overdose.  History obtained from patient.  No interpreter was used.     HPI  Past Medical History:  Diagnosis Date  . Cocaine dependence with cocaine-induced mood disorder (HCC)   . Depression   . 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 Schizoaffective disorder, bipolar type (HCC) 06/22/2017  . Suicide attempt [facility])     Patient Active Problem List   Diagnosis Date Noted  . MDD (major depressive disorder), recurrent episode (HCC) 08/06/2018  . AKI (acute kidney injury) (HCC) 10/15/2017  . Substance induced mood disorder (HCC) 09/28/2017  . Severe recurrent major depressive disorder with psychotic features (HCC) 09/15/2017  . Suicidal ideation 03/13/2016  . Cocaine abuse without complication (HCC) 05/23/2011    History reviewed. No pertinent surgical history.      Home Medications    Prior to Admission medications   Medication Sig Start Date End Date Taking? Authorizing  Provider  busPIRone (BUSPAR) 15 MG tablet Take 1 tablet (15 mg total) by mouth 2 (two) times daily. Patient not taking: Reported on 08/16/2018 08/10/18   Malvin Johns, MD  citalopram (CELEXA) 20 MG tablet Take 1 tablet (20 mg total) by mouth daily. Patient not taking: Reported on 08/16/2018 08/10/18   Malvin Johns, MD  prazosin (MINIPRESS) 2 MG capsule Take 1 capsule (2 mg total) by mouth at bedtime. Patient not taking: Reported on 08/16/2018 08/10/18   Malvin Johns, MD  QUEtiapine (SEROQUEL) 50 MG tablet Take 1 tablet (50 mg total) by mouth at bedtime. Patient not taking: Reported on 08/16/2018 08/10/18   Malvin Johns, MD    Family History No family history on file.  Social History Social History   Tobacco Use  . Smoking status: Former Games developer  . Smokeless tobacco: Never Used  Substance Use Topics  . Alcohol use: Yes  . Drug use: Yes    Types: Cocaine, "Crack" cocaine    Comment: today      Allergies   Asa [aspirin]; Nicotine; Paxil [paroxetine hcl]; and Tylenol [acetaminophen]   Review of Systems Review of Systems  Constitutional: Negative.   HENT: Negative.   Respiratory: Negative.   Cardiovascular: Negative.   Gastrointestinal: Negative.   Genitourinary: Negative.   Musculoskeletal: Negative.   Skin: Negative.   Psychiatric/Behavioral: Positive for hallucinations and suicidal ideas. Negative for agitation, behavioral problems, confusion, decreased concentration, dysphoric mood, self-injury and sleep disturbance. The patient is hyperactive. The patient is not nervous/anxious.   All other systems reviewed and are negative.    Physical  Exam Updated Vital Signs BP 106/73   Pulse 87   Temp 97.8 F (36.6 C) (Oral)   Resp 16   Ht 6' (1.829 m)   Wt 90.7 kg   SpO2 96%   BMI 27.12 kg/m   Physical Exam Vitals signs and nursing note reviewed.  Constitutional:      General: He is not in acute distress.    Appearance: He is well-developed. He is not diaphoretic.  HENT:      Head: Atraumatic.  Eyes:     Pupils: Pupils are equal, round, and reactive to light.  Neck:     Musculoskeletal: Normal range of motion and neck supple.  Cardiovascular:     Rate and Rhythm: Normal rate and regular rhythm.     Comments: No murmurs, rubs or gallops.  2+ DP, PT pulses bilaterally. Pulmonary:     Effort: Pulmonary effort is normal. No respiratory distress.     Comments: Clear to auscultation bilateral without wheeze, rhonchi or rales. Abdominal:     General: There is no distension.     Palpations: Abdomen is soft.     Comments: Soft without rebound or guarding.  Normoactive bowel sounds.  Musculoskeletal: Normal range of motion.     Comments: Moves all 4 extremities without difficulty.  No lower extremity edema, erythema or warmth.  No obvious injury or deformity.  Skin:    General: Skin is warm and dry.     Comments: Brisk capillary refill.  No rashes or lesions.  Neurological:     Mental Status: He is alert.     Comments: 5/5 strength bilateral lower extremities.  Ambulatory in department that difficulty.  Psychiatric:        Attention and Perception: Attention normal.        Mood and Affect: Affect normal. Mood is depressed.        Speech: Speech normal. He is communicative. Speech is not rapid and pressured, delayed, slurred or tangential.        Behavior: Behavior normal.        Thought Content: Thought content is not paranoid or delusional. Thought content includes suicidal ideation. Thought content does not include homicidal ideation. Thought content includes suicidal plan. Thought content does not include homicidal plan.        Cognition and Memory: Cognition normal.        Judgment: Judgment is impulsive. Judgment is not inappropriate.      ED Treatments / Results  Labs (all labs ordered are listed, but only abnormal results are displayed) Labs Reviewed  COMPREHENSIVE METABOLIC PANEL - Abnormal; Notable for the following components:      Result Value    Glucose, Bld 105 (*)    Creatinine, Ser 1.38 (*)    Calcium 8.7 (*)    GFR calc non Af Amer 58 (*)    All other components within normal limits  ACETAMINOPHEN LEVEL - Abnormal; Notable for the following components:   Acetaminophen (Tylenol), Serum <10 (*)    All other components within normal limits  ETHANOL  CBC WITH DIFFERENTIAL/PLATELET  SALICYLATE LEVEL  RAPID URINE DRUG SCREEN, HOSP PERFORMED    EKG None  Radiology No results found.  Procedures Procedures (including critical care time)  Medications Ordered in ED Medications  alum & mag hydroxide-simeth (MAALOX/MYLANTA) 200-200-20 MG/5ML suspension 30 mL (has no administration in time range)  QUEtiapine (SEROQUEL) tablet 50 mg (has no administration in time range)  busPIRone (BUSPAR) tablet 15 mg (has no administration in  time range)  sodium chloride 0.9 % bolus 1,000 mL (0 mLs Intravenous Stopped 08/21/18 2217)    Initial Impression / Assessment and Plan / ED Course  I have reviewed the triage vital signs and the nursing notes.  Pertinent labs & imaging results that were available during my care of the patient were reviewed by me and considered in my medical decision making (see chart for details).  53 year old history of polysubstance abuse and previous history of suicide attempts presents for evaluation of planed suicide attempt to walk out into traffic.  Patient states he recently on a crack cocaine "Binger."  States he has not been eating or drinking. States he is hearing voices telling him to walk out into traffic.  Denies HI or visual hallucinations.  Patient states he called the police and said he is going to walk out into traffic. Brought to emergency department for evaluation via [employer].  Has been inpatient previously.  Afebrile, nonseptic, non-ill-appearing.  Lungs clear.  Abdomen soft without rebound or guarding.  Lower extremities without edema, erythema or warmth.  Patient appears dry on exam. Will give liter of  fluids.  CBC without leukocytosis, metabolic panel with creatinine 1.38, improved from previous at 1.76, 2.4, no additional electrolyte, renal or liver abnormality, salicylate, ethanol, acetaminophen level negative.  UDS pending. Results of UDS will not change disposition.  Patient is medically cleared for TTS evaluation. Will place psych hold orders.  Patient is not currently under IVC.   Psychiatry has evaluated patient. Consulted with Marylene Land, Counselor as well Psychiatry NP.  Plan for reevaluation in the morning. Also recommends restarting Seroquel and BuSpar. Will place orders. Patiently currently eating on reevaluation.  Has no complaints at this time.     Final Clinical Impressions(s) / ED Diagnoses   Final diagnoses:  Suicidal ideation    ED Discharge Orders    None       Carmelite Violet A, PA-C 08/21/18 2312    Tegeler, Canary Brim, MD 08/21/18 [phone]

## 2018-08-21 NOTE — ED Notes (Signed)
TTS in progress 
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
## 2018-08-21 NOTE — ED Notes (Signed)
Pt reports last ETOH "2 days ago" when he drank a fifth of liquor.

## 2018-08-21 NOTE — BHH Counselor (Signed)
Pt denied having family, friend supports clinician could contact to obtain collateral information.   Redmond Pulling, MS, [facility], [facility] Triage Specialist [phone]

## 2018-08-22 NOTE — BH Assessment (Signed)
Patient evaluated by Reola Calkins, NP and he recommended trying to get patient into residential substance abuse treatment. Contacted [facility]-no beds, [facility]-no beds and/or intake availability until Wednesday of this week, and [facility] declined due to acuity (SI/HI). Updated Reola Calkins, NP and he will discuss bed placement with AC.

## 2018-08-22 NOTE — ED Notes (Signed)
No E-signature available; pt is stable for discharge and states understanding of discharge instructions. Denies SI and contracts for safety

## 2018-08-22 NOTE — BH Assessment (Signed)
Patient was seen by Reola Calkins, NP, he will not contract for safety.  Continued Inpatient recommended. Dr. Jeannine Kitten will also complete a face to face assessment on patient and disposition patient accordingly.

## 2018-08-22 NOTE — ED Notes (Signed)
Breakfast Tray Ordered. 

## 2018-08-22 NOTE — Evaluation (Signed)
Patient seen for tele-psych visit  He was last discharged on 3/18, though he reports to me that he does have a place to stay my understanding is that homelessness was an issue as of his last admission but now he states he has his own place he has relapsed on cocaine and would like to go to rehab. Urine drug screen does show cocaine alcohol level negligible  Mental status exam patient is alert and oriented to person place time situation affect appropriate for the situation speech normal rate and tone no evidence of psychosis no psychosis reported, no evidence of tremors or withdrawal symptoms. No thoughts of harming others Patient states that he had suicidal thoughts if he could not get into rehab however explained to him that beds are certainly limited due to viral outbreak and so forth and at this point he expresses understanding and when questioned specifically denies that he has suicidal plans or intent, denies current suicidal thinking.  He understands that he may or may not be admitted to some type of rehab facility. But on this mental status exam denied current suicidal thoughts plans or intent was just focused on obtaining rehab and again denied that he was homeless although previously we had suspected issues of secondary gain vertically since he tended to dictate his treatment while here and resist discharge.  Cocaine dependence and abuse On this eval denies suicidal thoughts plans or intent  Okay to release with outpatient referrals for rehab

## 2018-09-03 ENCOUNTER — Encounter (HOSPITAL_COMMUNITY): Payer: Self-pay | Admitting: Emergency Medicine

## 2018-09-03 ENCOUNTER — Other Ambulatory Visit: Payer: Self-pay

## 2018-09-03 ENCOUNTER — Emergency Department (HOSPITAL_COMMUNITY)
Admission: EM | Admit: 2018-09-03 | Discharge: 2018-09-04 | Disposition: A | Discharge status: Home / Self Care | Payer: Self-pay | Attending: Emergency Medicine | Admitting: Emergency Medicine

## 2018-09-03 DIAGNOSIS — T43592A Poisoning by other antipsychotics and neuroleptics, intentional self-harm, initial encounter: Secondary | ICD-10-CM | POA: Insufficient documentation

## 2018-09-03 DIAGNOSIS — Z87891 Personal history of nicotine dependence: Secondary | ICD-10-CM | POA: Insufficient documentation

## 2018-09-03 DIAGNOSIS — F333 Major depressive disorder, recurrent, severe with psychotic symptoms: Secondary | ICD-10-CM | POA: Insufficient documentation

## 2018-09-03 DIAGNOSIS — F250 Schizoaffective disorder, bipolar type: Secondary | ICD-10-CM | POA: Insufficient documentation

## 2018-09-03 DIAGNOSIS — F1910 Other psychoactive substance abuse, uncomplicated: Secondary | ICD-10-CM | POA: Insufficient documentation

## 2018-09-03 DIAGNOSIS — Z79899 Other long term (current) drug therapy: Secondary | ICD-10-CM | POA: Insufficient documentation

## 2018-09-03 DIAGNOSIS — F14159 Cocaine abuse with cocaine-induced psychotic disorder, unspecified: Secondary | ICD-10-CM | POA: Diagnosis present

## 2018-09-03 DIAGNOSIS — R45851 Suicidal ideations: Secondary | ICD-10-CM | POA: Insufficient documentation

## 2018-09-03 LAB — CBC WITH DIFFERENTIAL/PLATELET
Abs Immature Granulocytes: 0.03 10*3/uL (ref 0.00–0.07)
Basophils Absolute: 0 10*3/uL (ref 0.0–0.1)
Basophils Relative: 0 %
Eosinophils Absolute: 0.1 10*3/uL (ref 0.0–0.5)
Eosinophils Relative: 2 %
HCT: 50.9 % (ref 39.0–52.0)
Hemoglobin: 16.1 g/dL (ref 13.0–17.0)
Immature Granulocytes: 0 %
Lymphocytes Relative: 19 %
Lymphs Abs: 1.7 10*3/uL (ref 0.7–4.0)
MCH: 28.1 pg (ref 26.0–34.0)
MCHC: 31.6 g/dL (ref 30.0–36.0)
MCV: 88.8 fL (ref 80.0–100.0)
Monocytes Absolute: 0.6 10*3/uL (ref 0.1–1.0)
Monocytes Relative: 6 %
Neutro Abs: 6.4 10*3/uL (ref 1.7–7.7)
Neutrophils Relative %: 73 %
Platelets: 346 10*3/uL (ref 150–400)
RBC: 5.73 MIL/uL (ref 4.22–5.81)
RDW: 13 % (ref 11.5–15.5)
WBC: 8.8 10*3/uL (ref 4.0–10.5)
nRBC: 0 % (ref 0.0–0.2)

## 2018-09-03 LAB — SALICYLATE LEVEL: Salicylate Lvl: <7 mg/dL (ref 2.8–30.0)

## 2018-09-03 LAB — COMPREHENSIVE METABOLIC PANEL
ALT: 22 U/L (ref 0–44)
AST: 26 U/L (ref 15–41)
Albumin: 4.4 g/dL (ref 3.5–5.0)
Alkaline Phosphatase: 71 U/L (ref 38–126)
Anion gap: 12 (ref 5–15)
BUN: 10 mg/dL (ref 6–20)
CO2: 23 mmol/L (ref 22–32)
Calcium: 9 mg/dL (ref 8.9–10.3)
Chloride: 101 mmol/L (ref 98–111)
Creatinine, Ser: 1.06 mg/dL (ref 0.61–1.24)
GFR calc Af Amer: >60 mL/min (ref >60)
GFR calc non Af Amer: >60 mL/min (ref >60)
Glucose, Bld: 100 mg/dL — ABNORMAL HIGH (ref 70–99)
Potassium: 3.6 mmol/L (ref 3.5–5.1)
Sodium: 136 mmol/L (ref 135–145)
Total Bilirubin: 1.1 mg/dL (ref 0.3–1.2)
Total Protein: 7.9 g/dL (ref 6.5–8.1)

## 2018-09-03 LAB — ACETAMINOPHEN LEVEL
Acetaminophen (Tylenol), Serum: <10 ug/mL — ABNORMAL LOW (ref 10–30)
Acetaminophen (Tylenol), Serum: <10 ug/mL — ABNORMAL LOW (ref 10–30)

## 2018-09-03 LAB — ETHANOL: Alcohol, Ethyl (B): <10 mg/dL (ref <10)

## 2018-09-03 LAB — RAPID URINE DRUG SCREEN, HOSP PERFORMED
Amphetamines: NOT DETECTED
Barbiturates: NOT DETECTED
Benzodiazepines: NOT DETECTED
Cocaine: POSITIVE — AB
Opiates: NOT DETECTED
Tetrahydrocannabinol: NOT DETECTED

## 2018-09-03 MED ORDER — THIAMINE HCL 100 MG/ML IJ SOLN: 100.0000 mg | Freq: Every day | INTRAMUSCULAR | Status: DC

## 2018-09-03 MED ORDER — PRAZOSIN HCL 1 MG PO CAPS
2.0000 mg | ORAL_CAPSULE | Freq: Every day | ORAL | Status: DC
  Administered 2018-09-03: 2 mg via ORAL
  Filled 2018-09-03: qty 2

## 2018-09-03 MED ORDER — LORAZEPAM 1 MG PO TABS: 0.0000 mg | ORAL_TABLET | Freq: Two times a day (BID) | ORAL | Status: DC

## 2018-09-03 MED ORDER — QUETIAPINE FUMARATE 50 MG PO TABS
50.0000 mg | ORAL_TABLET | Freq: Every day | ORAL | Status: DC
  Administered 2018-09-03: 50 mg via ORAL
  Filled 2018-09-03: qty 1

## 2018-09-03 MED ORDER — LORAZEPAM 2 MG/ML IJ SOLN: 0.0000 mg | Freq: Two times a day (BID) | INTRAMUSCULAR | Status: DC

## 2018-09-03 MED ORDER — BUSPIRONE HCL 10 MG PO TABS
15.0000 mg | ORAL_TABLET | Freq: Two times a day (BID) | ORAL | Status: DC
  Administered 2018-09-03 – 2018-09-04 (×2): 15 mg via ORAL
  Filled 2018-09-03 (×2): qty 2

## 2018-09-03 MED ORDER — LORAZEPAM 2 MG/ML IJ SOLN: 0.0000 mg | Freq: Four times a day (QID) | INTRAMUSCULAR | Status: DC

## 2018-09-03 MED ORDER — VITAMIN B-1 100 MG PO TABS
100.0000 mg | ORAL_TABLET | Freq: Every day | ORAL | Status: DC
  Administered 2018-09-03 – 2018-09-04 (×2): 100 mg via ORAL
  Filled 2018-09-03 (×2): qty 1

## 2018-09-03 MED ORDER — CITALOPRAM HYDROBROMIDE 10 MG PO TABS
20.0000 mg | ORAL_TABLET | Freq: Every day | ORAL | Status: DC
  Administered 2018-09-04: 20 mg via ORAL
  Filled 2018-09-03: qty 2

## 2018-09-03 MED ORDER — LORAZEPAM 1 MG PO TABS: 0.0000 mg | ORAL_TABLET | Freq: Four times a day (QID) | ORAL | Status: DC

## 2018-09-03 NOTE — ED Notes (Signed)
As I write this, he is still in triage.

## 2018-09-03 NOTE — ED Notes (Signed)
Writer had patient on TCU unit last week. He is not homeless, he lives in his trailer. He has been depressed and frequently SI since his wife left him with in the year. He has limited support systems. He states he is here for SI attempt but is able to promise his safety while here. He voiced no complaints when asked how he was feeling. UDS positive for cocaine. He is pleasant and appropriate. Will continue to monitor for safety and needs.

## 2018-09-03 NOTE — ED Notes (Signed)
I have just spoken with Teena Irani Ph. At [facility], who recommends a 4 (four hour observation. Monitor for prolongation of qtc interval. They recommend repeat EKG/Tylenol level at 1400 hours. Also monitor for seizures and sedation.

## 2018-09-03 NOTE — BH Assessment (Addendum)
[facility] Assessment Progress Note  Case was staffed with Arville Care FNP who recommneded patient be observed and monitored.

## 2018-09-03 NOTE — ED Notes (Signed)
Pt was oriented to room and unit.  Pt is in no distress and is calm and cooperative.  Pt states " I'm still kinda suicidal"   Pt contracted for safety.  15 minute checks and video monitoring in place.
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
## 2018-09-03 NOTE — ED Notes (Signed)
Poison control called and suggested repeat acetaminophen level.  Message sent to EDP.

## 2018-09-03 NOTE — ED Notes (Signed)
At this time he is wanded by, and taken to Acute Unit by [facility] from security. He had formerly been placed in scrubs.

## 2018-09-03 NOTE — ED Provider Notes (Signed)
[facility]-EMERGENCY DEPT Provider Note   CSN: [id number] Arrival date & time: 09/03/18  1144    History   Chief Complaint Chief Complaint  Patient presents with  . Ingestion  . Suicidal    HPI Logan CliffJames Oliver Elmquist Jr. is a 53 y.o. male.     53 year old male presents with suicidal ideations.  Patient states that he took 10 Zyprexa tablets and had emesis shortly thereafter.  States that he has been hearing voices told him to take his life.  Denies any intentional ingestion of aspirin or Tylenol.  Also admits to drinking 1/5 of whiskey yesterday.  States he drinks every other day.  Has a history of suicide attempt before in the past.  States he is also thought about running in front of vehicles.  Denies any homicidal ideations.     Past Medical History:  Diagnosis Date  . Cocaine dependence with cocaine-induced mood disorder (HCC)   . Depression   . 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 Schizoaffective disorder, bipolar type (HCC) 06/22/2017  . Suicide attempt [facility](HCC)     Patient Active Problem List   Diagnosis Date Noted  . MDD (major depressive disorder), recurrent episode (HCC) 08/06/2018  . AKI (acute kidney injury) (HCC) 10/15/2017  . Substance induced mood disorder (HCC) 09/28/2017  . Severe recurrent major depressive disorder with psychotic features (HCC) 09/15/2017  . Suicidal ideation 03/13/2016  . Cocaine abuse without complication (HCC) 05/23/2011    History reviewed. No pertinent surgical history.      Home Medications    Prior to Admission medications   Medication Sig Start Date End Date Taking? Authorizing Provider  busPIRone (BUSPAR) 15 MG tablet Take 1 tablet (15 mg total) by mouth 2 (two) times daily. 08/10/18   Malvin JohnsFarah, Brian, MD  citalopram (CELEXA) 20 MG tablet Take 1 tablet (20 mg total) by mouth daily. 08/10/18   Malvin JohnsFarah, Brian, MD  prazosin (MINIPRESS) 2 MG capsule Take 1 capsule  (2 mg total) by mouth at bedtime. Patient not taking: Reported on 08/16/2018 08/10/18   Malvin JohnsFarah, Brian, MD  QUEtiapine (SEROQUEL) 50 MG tablet Take 1 tablet (50 mg total) by mouth at bedtime. Patient taking differently: Take 100 mg by mouth at bedtime.  08/10/18   Malvin JohnsFarah, Brian, MD  traZODone (DESYREL) 50 MG tablet Take 50 mg by mouth at bedtime.    [provider]    Family History No family history on file.  Social History Social History   Tobacco Use  . Smoking status: Former Games developermoker  . Smokeless tobacco: Never Used  Substance Use Topics  . Alcohol use: Yes  . Drug use: Yes    Types: Cocaine, "Crack" cocaine    Comment: today      Allergies   Asa [aspirin]; Nicotine; Paxil [paroxetine hcl]; and Tylenol [acetaminophen]   Review of Systems Review of Systems  All other systems reviewed and are negative.    Physical Exam Updated Vital Signs BP 113/80   Pulse 69   Temp 97.8 F (36.6 C) (Oral)   Resp 18   SpO2 99%   Physical Exam Vitals signs and nursing note reviewed.  Constitutional:      General: He is not in acute distress.    Appearance: Normal appearance. He is well-developed. He is not toxic-appearing.  HENT:     Head: Normocephalic and atraumatic.  Eyes:     General: Lids are normal.     Conjunctiva/sclera: Conjunctivae normal.  Pupils: Pupils are equal, round, and reactive to light.  Neck:     Musculoskeletal: Normal range of motion and neck supple.     Thyroid: No thyroid mass.     Trachea: No tracheal deviation.  Cardiovascular:     Rate and Rhythm: Normal rate and regular rhythm.     Heart sounds: Normal heart sounds. No murmur. No gallop.   Pulmonary:     Effort: Pulmonary effort is normal. No respiratory distress.     Breath sounds: Normal breath sounds. No stridor. No decreased breath sounds, wheezing, rhonchi or rales.  Abdominal:     General: Bowel sounds are normal. There is no distension.     Palpations: Abdomen is soft.      Tenderness: There is no abdominal tenderness. There is no rebound.  Musculoskeletal: Normal range of motion.        General: No tenderness.  Skin:    General: Skin is warm and dry.     Findings: No abrasion or rash.  Neurological:     Mental Status: He is alert and oriented to person, place, and time.     GCS: GCS eye subscore is 4. GCS verbal subscore is 5. GCS motor subscore is 6.     Cranial Nerves: No cranial nerve deficit.     Sensory: No sensory deficit.  Psychiatric:        Mood and Affect: Affect is blunt.        Speech: Speech normal.        Behavior: Behavior is withdrawn.        Thought Content: Thought content includes suicidal ideation. Thought content includes suicidal plan.      ED Treatments / Results  Labs (all labs ordered are listed, but only abnormal results are displayed) Labs Reviewed  CBC WITH DIFFERENTIAL/PLATELET  ETHANOL  RAPID URINE DRUG SCREEN, HOSP PERFORMED  SALICYLATE LEVEL  ACETAMINOPHEN LEVEL  COMPREHENSIVE METABOLIC PANEL    EKG EKG Interpretation  Date/Time:  Saturday September 03 2018 12:09:21 EDT Ventricular Rate:  72 PR Interval:    QRS Duration: 96 QT Interval:  403 QTC Calculation: 441 R Axis:   63 Text Interpretation:  Sinus rhythm Low voltage, precordial leads Confirmed by Lorre Nick ([id number]) on 09/03/2018 12:31:09 PM   Radiology No results found.  Procedures Procedures (including critical care time)  Medications Ordered in ED Medications - No data to display   Initial Impression / Assessment and Plan / ED Course  I have reviewed the triage vital signs and the nursing notes.  Pertinent labs & imaging results that were available during my care of the patient were reviewed by me and considered in my medical decision making (see chart for details).        Patient is EKG results noted and no signs of severe QT prolongation.  Patient had emesis after he ingested the Zyprexa but will consult poison control.  Patient has  no signs of alcohol withdrawal at this time.  Anticipate that patient will be clear medically and will consult psychiatry for disposition.  Final Clinical Impressions(s) / ED Diagnoses   Final diagnoses:  None    ED Discharge Orders    None       Lorre Nick, MD 09/03/18 1233

## 2018-09-03 NOTE — ED Triage Notes (Signed)
Pt comes in for SI. Reports that he was at friend's house couple hours ago and ingested a handful of Zyprexa he found in his friend's bathroom.  Pt reports also been smoking a lot of crack to try to kill himself. Reports his wife came back then left him again. Reports been out of his medications for a week: Busbar, Celexa, Seroquel.

## 2018-09-03 NOTE — ED Notes (Signed)
Urinal at bedside. Pt has been made aware that we need UA sample

## 2018-09-04 DIAGNOSIS — F14159 Cocaine abuse with cocaine-induced psychotic disorder, unspecified: Secondary | ICD-10-CM

## 2018-09-04 MED ORDER — GABAPENTIN 100 MG PO CAPS
200.0000 mg | ORAL_CAPSULE | Freq: Two times a day (BID) | ORAL | Status: DC
  Administered 2018-09-04: 200 mg via ORAL
  Filled 2018-09-04: qty 2

## 2018-09-04 NOTE — Consult Note (Addendum)
[facility] Psych ED Discharge  09/04/2018 9:38 AM Logan [name]  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 Pt denies suicidal/homicidal ideation, denies auditory/visual hallucinations and does not appear to be responding to internal stimuli. Pt is well known to this emergency room system. He frequently presents with suicidal ideation and states he took an overdose of medication. Pt has been in the emergency room 6 times in 6 months. He was admitted to [facility] in March and discharged with medications and follow up instructions for [facility]. Pt did not follow up as instructed and has not been taking his mental health medications and says he could not afford them. Pt's UDS positive for cocaine, BAL negative. Pt stated he panhandles for his cocaine. Pt lives with a roommate. Pt is having relationship problems with his wife. Pt's suicidal ideations are situational and chronic in nature. Pt will again be provided with outpatient resources. Pt is psychiatrically clear.    Past Medical History:  Past Medical History:  Diagnosis Date  . Cocaine dependence with cocaine-induced mood disorder (HCC)   . Depression   . 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 Schizoaffective disorder, bipolar type (HCC) 06/22/2017  . Suicide attempt [facility])    History reviewed. No pertinent surgical history. Family History: No family history on file. Family Psychiatric  History: Pt did not give this information Social History:  Social History   Substance and Sexual Activity  Alcohol Use Yes     Social  History   Substance and Sexual Activity  Drug Use Yes  . Types: Cocaine, "Crack" cocaine   Comment: today     Social History   Socioeconomic History  . Marital status: Legally Separated    Spouse name: Not on file  . Number of children: Not on file  . Years of education: Not on file  . Highest education level: Not on file  Occupational History  . Not on file  Social Needs  . Financial resource strain: Not on file  . Food insecurity:    Worry: Not on file    Inability: Not on file  . Transportation needs:    Medical: Not on file    Non-medical: Not on file  Tobacco Use  . Smoking status: Former Games developer  . Smokeless tobacco: Never Used  Substance and Sexual Activity  . Alcohol use: Yes  . Drug use: Yes    Types: Cocaine, "Crack" cocaine    Comment: today   . Sexual activity: Yes  Lifestyle  . Physical activity:    Days per week: Not on file    Minutes per session: Not on file  . Stress: Not on file  Relationships  . Social connections:    Talks on phone: Not on file    Gets together: Not on file    Attends religious service: Not on file    Active member of club or organization: Not on file    Attends meetings of clubs or organizations: Not on file    Relationship status: Not on file  Other Topics Concern  . Not on file  Social History Narrative  . Not on  file    Has this patient used any form of tobacco in the last 30 days? (Cigarettes, Smokeless Tobacco, Cigars, and/or Pipes) Prescription not provided because: Pt denies tobacco use  Current Medications: Current Facility-Administered Medications  Medication Dose Route Frequency Provider Last Rate Last Dose  . busPIRone (BUSPAR) tablet 15 mg  15 mg Oral BID Laveda Abbe, NP   15 mg at 09/04/18 0935  . citalopram (CELEXA) tablet 20 mg  20 mg Oral Daily Laveda Abbe, NP   20 mg at 09/04/18 0936  . gabapentin (NEURONTIN) capsule 200 mg  200 mg Oral BID Jannifer Franklin, Jaylen Claude, MD   200 mg at 09/04/18  0935  . prazosin (MINIPRESS) capsule 2 mg  2 mg Oral QHS Laveda Abbe, NP   2 mg at 09/03/18 2103  . QUEtiapine (SEROQUEL) tablet 50 mg  50 mg Oral QHS Laveda Abbe, NP   50 mg at 09/03/18 2103  . thiamine (VITAMIN B-1) tablet 100 mg  100 mg Oral Daily Lorre Nick, MD   100 mg at 09/04/18 9507   Or  . thiamine (B-1) injection 100 mg  100 mg Intravenous Daily Lorre Nick, MD       Current Outpatient Medications  Medication Sig Dispense Refill  . busPIRone (BUSPAR) 15 MG tablet Take 1 tablet (15 mg total) by mouth 2 (two) times daily. 60 tablet 2  . citalopram (CELEXA) 20 MG tablet Take 1 tablet (20 mg total) by mouth daily. 30 tablet 1  . ibuprofen (ADVIL,MOTRIN) 200 MG tablet Take 400 mg by mouth every 6 (six) hours as needed (For back pain.).    Marland Kitchen prazosin (MINIPRESS) 2 MG capsule Take 1 capsule (2 mg total) by mouth at bedtime. 30 capsule 1  . QUEtiapine (SEROQUEL) 50 MG tablet Take 1 tablet (50 mg total) by mouth at bedtime. (Patient taking differently: Take 100 mg by mouth at bedtime. ) 30 tablet 2  . traZODone (DESYREL) 50 MG tablet Take 50 mg by mouth at bedtime.       Musculoskeletal: Strength & Muscle Tone: within normal limits Gait & Station: normal Patient leans: N/A  Psychiatric Specialty Exam: Physical Exam  Constitutional: He is oriented to person, place, and time. He appears well-developed and well-nourished.  HENT:  Head: Normocephalic.  Respiratory: Effort normal.  Musculoskeletal: Normal range of motion.  Neurological: He is alert and oriented to person, place, and time.  Psychiatric: His affect is angry.    Review of Systems  Psychiatric/Behavioral: Positive for substance abuse.  All other systems reviewed and are negative.   Blood pressure 107/67, pulse 83, temperature 97.6 F (36.4 C), temperature source Oral, resp. rate 20, SpO2 99 %.There is no height or weight on file to calculate BMI.  General Appearance: Casual  Eye Contact:   Good  Speech:  Clear and Coherent and Normal Rate  Volume:  Normal  Mood:  Irritable  Affect:  Congruent  Thought Process:  Coherent and Descriptions of Associations: Intact  Orientation:  Full (Time, Place, and Person)  Thought Content:  Logical  Suicidal Thoughts:  No  Homicidal Thoughts:  No  Memory:  Immediate;   Good Recent;   Good Remote;   Fair  Judgement:  Fair  Insight:  Fair  Psychomotor Activity:  Normal  Concentration:  Concentration: Good and Attention Span: Good  Recall:  Good  Fund of Knowledge:  Good  Language:  Good  Akathisia:  No  Handed:  Right  AIMS (if indicated):  Assets:  Manufacturing systems engineerCommunication Skills Social Support  ADL's:  Intact  Cognition:  WNL  Sleep:        Demographic Factors:  Male, Caucasian, Low socioeconomic status and Unemployed  Loss Factors: Financial problems/change in socioeconomic status  Historical Factors: Family history of mental illness or substance abuse  Risk Reduction Factors:   Sense of responsibility to family  Continued Clinical Symptoms:  Alcohol/Substance Abuse/Dependencies  Cognitive Features That Contribute To Risk:  Closed-mindedness    Suicide Risk:  Minimal: No identifiable suicidal ideation.  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 Keep all follow-up appointments as scheduled.  Do not consume alcohol or use illegal drugs while on prescription medications. Report any adverse effects from your medications to your primary care provider promptly.  In the event of recurrent symptoms or worsening symptoms, call 911, a crisis hotline, or go to the nearest  emergency department for evaluation.   Laveda AbbeLaurie Britton Parks, NP 09/04/2018, 9:38 AM  Patient seen face-to-face for psychiatric evaluation, chart reviewed and case discussed with the physician extender and developed treatment plan. Reviewed the information documented and agree with the treatment plan. Thedore MinsMojeed Trek Kimball, MD

## 2018-09-04 NOTE — ED Notes (Signed)
Pt DC d to home per MD order.  Pt calm, cooperative, no s/s of distress. DC instructions reviewed and given to pt with acknowledged understanding.  Pt denied SI/HI. Denied A/V hallucinations. Pt belongings given to pt . Pt ambulatory of unit escorted by [facility]. Pt using bus for transportation.

## 2018-09-06 ENCOUNTER — Emergency Department (HOSPITAL_COMMUNITY)
Admission: EM | Admit: 2018-09-06 | Discharge: 2018-09-07 | Disposition: A | Discharge status: Home / Self Care | Payer: Self-pay | Attending: Emergency Medicine | Admitting: Emergency Medicine

## 2018-09-06 ENCOUNTER — Encounter (HOSPITAL_COMMUNITY): Payer: Self-pay | Admitting: *Deleted

## 2018-09-06 ENCOUNTER — Other Ambulatory Visit: Payer: Self-pay

## 2018-09-06 ENCOUNTER — Emergency Department (HOSPITAL_COMMUNITY): Payer: Self-pay

## 2018-09-06 DIAGNOSIS — Z9114 Patient's other noncompliance with medication regimen: Secondary | ICD-10-CM | POA: Insufficient documentation

## 2018-09-06 DIAGNOSIS — G4700 Insomnia, unspecified: Secondary | ICD-10-CM | POA: Insufficient documentation

## 2018-09-06 DIAGNOSIS — R45851 Suicidal ideations: Secondary | ICD-10-CM | POA: Insufficient documentation

## 2018-09-06 DIAGNOSIS — Z79899 Other long term (current) drug therapy: Secondary | ICD-10-CM | POA: Insufficient documentation

## 2018-09-06 DIAGNOSIS — X58XXXA Exposure to other specified factors, initial encounter: Secondary | ICD-10-CM | POA: Insufficient documentation

## 2018-09-06 DIAGNOSIS — Y9389 Activity, other specified: Secondary | ICD-10-CM | POA: Insufficient documentation

## 2018-09-06 DIAGNOSIS — R441 Visual hallucinations: Secondary | ICD-10-CM | POA: Insufficient documentation

## 2018-09-06 DIAGNOSIS — S20212A Contusion of left front wall of thorax, initial encounter: Secondary | ICD-10-CM | POA: Insufficient documentation

## 2018-09-06 DIAGNOSIS — Y9289 Other specified places as the place of occurrence of the external cause: Secondary | ICD-10-CM | POA: Insufficient documentation

## 2018-09-06 DIAGNOSIS — Y999 Unspecified external cause status: Secondary | ICD-10-CM | POA: Insufficient documentation

## 2018-09-06 DIAGNOSIS — R440 Auditory hallucinations: Secondary | ICD-10-CM | POA: Insufficient documentation

## 2018-09-06 DIAGNOSIS — F14159 Cocaine abuse with cocaine-induced psychotic disorder, unspecified: Secondary | ICD-10-CM | POA: Diagnosis present

## 2018-09-06 DIAGNOSIS — Z87891 Personal history of nicotine dependence: Secondary | ICD-10-CM | POA: Insufficient documentation

## 2018-09-06 DIAGNOSIS — F329 Major depressive disorder, single episode, unspecified: Secondary | ICD-10-CM | POA: Insufficient documentation

## 2018-09-06 MED ORDER — ALUM & MAG HYDROXIDE-SIMETH 200-200-20 MG/5ML PO SUSP: 30.0000 mL | Freq: Four times a day (QID) | ORAL | Status: DC | PRN

## 2018-09-06 MED ORDER — IBUPROFEN 200 MG PO TABS
400.0000 mg | ORAL_TABLET | Freq: Once | ORAL | Status: AC
  Administered 2018-09-07: 400 mg via ORAL
  Filled 2018-09-06: qty 2

## 2018-09-06 MED ORDER — QUETIAPINE FUMARATE 100 MG PO TABS
100.0000 mg | ORAL_TABLET | Freq: Every day | ORAL | Status: DC
  Administered 2018-09-07: 100 mg via ORAL
  Filled 2018-09-06: qty 1

## 2018-09-06 MED ORDER — PRAZOSIN HCL 1 MG PO CAPS
2.0000 mg | ORAL_CAPSULE | Freq: Every day | ORAL | Status: DC
  Administered 2018-09-07: 2 mg via ORAL
  Filled 2018-09-06: qty 2

## 2018-09-06 MED ORDER — CITALOPRAM HYDROBROMIDE 10 MG PO TABS
20.0000 mg | ORAL_TABLET | Freq: Every day | ORAL | Status: DC
  Administered 2018-09-07: 20 mg via ORAL
  Filled 2018-09-06 (×2): qty 2

## 2018-09-06 MED ORDER — IBUPROFEN 200 MG PO TABS: 400.0000 mg | ORAL_TABLET | Freq: Four times a day (QID) | ORAL | Status: DC | PRN

## 2018-09-06 MED ORDER — TRAZODONE HCL 50 MG PO TABS
50.0000 mg | ORAL_TABLET | Freq: Every day | ORAL | Status: DC
  Administered 2018-09-07: 50 mg via ORAL
  Filled 2018-09-06: qty 1

## 2018-09-06 MED ORDER — ONDANSETRON HCL 4 MG PO TABS: 4.0000 mg | ORAL_TABLET | Freq: Three times a day (TID) | ORAL | Status: DC | PRN

## 2018-09-06 MED ORDER — BUSPIRONE HCL 10 MG PO TABS
15.0000 mg | ORAL_TABLET | Freq: Two times a day (BID) | ORAL | Status: DC
  Administered 2018-09-07 (×2): 15 mg via ORAL
  Filled 2018-09-06 (×2): qty 2

## 2018-09-06 NOTE — ED Notes (Signed)
Patient transported to X-ray 

## 2018-09-06 NOTE — ED Triage Notes (Addendum)
Pt brought in by [facility].  Pt stated "I'm from [place] and I need medication refills or samples.  I'm homeless and living in a camper.  I know where [facility] is but I don't know where [facility] is.  The Sheriff found me walking down the road.  I haven't eaten in 3 days because I don't have any money.  I spent all my money on crack and ETOH.  I spend about $100 on crack and about $20 on ETOH.  I panhandle @ [facility].  I made a mistake and let my wife come back and she has left again and that's why I want to kill myself.  I would jump out in front of a car."

## 2018-09-06 NOTE — ED Provider Notes (Signed)
[facility]-EMERGENCY DEPT Provider Note   CSN: [id number] Arrival date & time: 09/06/18  2250    History   Chief Complaint Chief Complaint  Patient presents with  . Medication Refill  . Suicidal    HPI Logan CliffJames Oliver Siegenthaler Jr. is a 53 y.o. male.  The history is provided by the patient.  He has a history schizoaffective disorder, depression, substance abuse and was brought in by police because he was on the highway trying to jump in front of cars.  He states that this was a suicide attempt and he has felt suicidal for the last month since his wife left him.  He also states that he has not been able to take his medications because he ran out and not know how to get to [facility] by public transportation.  He does admit to visual and auditory hallucinations.  He sees his mother who is dead, and hears voices telling him to kill himself.  He states he has not slept in the last 3 days and has not eaten in the last 3 days.  He does admit to drinking half of 1/5 of liquor today, and also smoking crack cocaine today.  He denies other drug use.  Of note, he does complain of sharp pain in the left posterior lateral rib cage which is worse with any kind of movement.  He wonders if he may have pulled something 1 of the times when he tried to jump in front of a car.  Past Medical History:  Diagnosis Date  . Cocaine dependence with cocaine-induced mood disorder (HCC)   . Depression   . 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 Schizoaffective disorder, bipolar type (HCC) 06/22/2017  . Suicide attempt [facility](HCC)     Patient Active Problem List   Diagnosis Date Noted  . MDD (major depressive disorder), recurrent episode (HCC) 08/06/2018  . AKI (acute kidney injury) (HCC) 10/15/2017  . Substance induced mood disorder (HCC) 09/28/2017  . Severe recurrent major depressive disorder with psychotic features (HCC) 09/15/2017  . Suicidal ideation  03/13/2016  . 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 No pertinent surgical history.      Home Medications    Prior to Admission medications   Medication Sig Start Date End Date Taking? Authorizing Provider  busPIRone (BUSPAR) 15 MG tablet Take 1 tablet (15 mg total) by mouth 2 (two) times daily. 08/10/18   Malvin JohnsFarah, Brian, MD  citalopram (CELEXA) 20 MG tablet Take 1 tablet (20 mg total) by mouth daily. 08/10/18   Malvin JohnsFarah, Brian, MD  ibuprofen (ADVIL,MOTRIN) 200 MG tablet Take 400 mg by mouth every 6 (six) hours as needed (For back pain.).    [provider]  prazosin (MINIPRESS) 2 MG capsule Take 1 capsule (2 mg total) by mouth at bedtime. 08/10/18   Malvin JohnsFarah, Brian, MD  QUEtiapine (SEROQUEL) 50 MG tablet Take 1 tablet (50 mg total) by mouth at bedtime. Patient taking differently: Take 100 mg by mouth at bedtime.  08/10/18   Malvin JohnsFarah, Brian, MD  traZODone (DESYREL) 50 MG tablet Take 50 mg by mouth at bedtime.    [provider]    Family History No family history on file.  Social History Social History   Tobacco Use  . Smoking status: Former Games developermoker  . Smokeless tobacco: Never Used  Substance Use Topics  . Alcohol use: Yes  . Drug use: Yes    Types: Cocaine, "  Crack" cocaine    Comment: today      Allergies   Asa [aspirin]; Nicotine; Paxil [paroxetine hcl]; and Tylenol [acetaminophen]   Review of Systems Review of Systems  All other systems reviewed and are negative.    Physical Exam Updated Vital Signs BP 113/70 (BP Location: Left Arm)   Pulse 88   Temp 98.1 F (36.7 C) (Oral)   Resp 16   Ht 6' (1.829 m)   Wt 90.7 kg   SpO2 97%   BMI 27.12 kg/m   Physical Exam Vitals signs and nursing note reviewed.    53 year old male, resting comfortably and in no acute distress. Vital signs are normal. Oxygen saturation is 97%, which is normal. Head is normocephalic and atraumatic. PERRLA, EOMI. Oropharynx is  clear. Neck is nontender and supple without adenopathy or JVD. Back is nontender and there is no CVA tenderness. Lungs are clear without rales, wheezes, or rhonchi. Chest is moderately tender in the left posterolateral area without crepitus. Heart has regular rate and rhythm without murmur. Abdomen is soft, flat, nontender without masses or hepatosplenomegaly and peristalsis is normoactive. Extremities have no cyanosis or edema, full range of motion is present. Skin is warm and dry without rash. Neurologic: Mental status is normal, cranial nerves are intact, there are no motor or sensory deficits.  He is calm and cooperative.  ED Treatments / Results  Labs (all labs ordered are listed, but only abnormal results are displayed) Labs Reviewed  COMPREHENSIVE METABOLIC PANEL - Abnormal; Notable for the following components:      Result Value   Glucose, Bld 105 (*)    All other components within normal limits  ACETAMINOPHEN LEVEL - Abnormal; Notable for the following components:   Acetaminophen (Tylenol), Serum <10 (*)    All other components within normal limits  ETHANOL  SALICYLATE LEVEL  CBC WITH DIFFERENTIAL/PLATELET  RAPID URINE DRUG SCREEN, HOSP PERFORMED    EKG EKG Interpretation  Date/Time:  Wednesday September 07 2018 00:06:07 EDT Ventricular Rate:  89 PR Interval:    QRS Duration: 96 QT Interval:  385 QTC Calculation: 469 R Axis:   77 Text Interpretation:  Sinus rhythm Low voltage, precordial leads When compared with ECG of 09/03/2018, No significant change was found Confirmed by Dione Booze ([id number]) on 09/07/2018 12:15:32 AM Also confirmed by Dione Booze ([id number]), editor Barbette Hair [phone])  on 09/07/2018 7:18:48 AM   Radiology Dg Ribs Unilateral W/chest Left  Result Date: 09/07/2018 CLINICAL DATA:  53 y/o  M; left lower posterior rib pain. EXAM: LEFT RIBS AND CHEST - 3+ VIEW COMPARISON:  10/24/2017 chest radiograph FINDINGS: No fracture or other bone lesions are seen  involving the ribs. There is no evidence of pneumothorax or pleural effusion. Both lungs are clear. Heart size and mediastinal contours are within normal limits. IMPRESSION: Negative. Electronically Signed   By: Mitzi Hansen M.D.   On: 09/07/2018 00:40    Procedures Procedures (including critical care time)  Medications Ordered in ED Medications - No data to display   Initial Impression / Assessment and Plan / ED Course  I have reviewed the triage vital signs and the nursing notes.  Pertinent labs & imaging results that were available during my care of the patient were reviewed by me and considered in my medical decision making (see chart for details).  Major depression with suicidal ideation.  Old records are reviewed, and he was seen in the ED with similar complaints on March 29, March 24.  On April 11, he was seen in the ED after taking an overdose of Zyprexa.  He [facility].  Chest pain appears to be chest wall pain, but will check rib x-rays.  Will check ECG to evaluate QT interval since he is on medications which can prolong it.  Will get TTS evaluation.  ECG is unremarkable including normal QT interval.  Rib x-rays are negative for fracture.  Labs are unremarkable.  TTS consultation is appreciated.  Patient will be held overnight for evaluation by psychiatry in the morning.  Final Clinical Impressions(s) / ED Diagnoses   Final diagnoses:  Suicidal ideation  Auditory hallucinations  Chest wall contusion, left, initial encounter    ED Discharge Orders    None       Dione Booze, MD 09/07/18 [phone]

## 2018-09-07 LAB — CBC WITH DIFFERENTIAL/PLATELET
Abs Immature Granulocytes: 0.03 10*3/uL (ref 0.00–0.07)
Basophils Absolute: 0 10*3/uL (ref 0.0–0.1)
Basophils Relative: 0 %
Eosinophils Absolute: 0.1 10*3/uL (ref 0.0–0.5)
Eosinophils Relative: 1 %
HCT: 44.5 % (ref 39.0–52.0)
Hemoglobin: 14.4 g/dL (ref 13.0–17.0)
Immature Granulocytes: 0 %
Lymphocytes Relative: 21 %
Lymphs Abs: 2.1 10*3/uL (ref 0.7–4.0)
MCH: 28.7 pg (ref 26.0–34.0)
MCHC: 32.4 g/dL (ref 30.0–36.0)
MCV: 88.6 fL (ref 80.0–100.0)
Monocytes Absolute: 0.8 10*3/uL (ref 0.1–1.0)
Monocytes Relative: 8 %
Neutro Abs: 7 10*3/uL (ref 1.7–7.7)
Neutrophils Relative %: 70 %
Platelets: 303 10*3/uL (ref 150–400)
RBC: 5.02 MIL/uL (ref 4.22–5.81)
RDW: 13.3 % (ref 11.5–15.5)
WBC: 10.1 10*3/uL (ref 4.0–10.5)
nRBC: 0 % (ref 0.0–0.2)

## 2018-09-07 LAB — RAPID URINE DRUG SCREEN, HOSP PERFORMED
Amphetamines: NOT DETECTED
Barbiturates: NOT DETECTED
Benzodiazepines: NOT DETECTED
Cocaine: POSITIVE — AB
Opiates: NOT DETECTED
Tetrahydrocannabinol: NOT DETECTED

## 2018-09-07 LAB — COMPREHENSIVE METABOLIC PANEL
ALT: 23 U/L (ref 0–44)
AST: 29 U/L (ref 15–41)
Albumin: 4.4 g/dL (ref 3.5–5.0)
Alkaline Phosphatase: 75 U/L (ref 38–126)
Anion gap: 8 (ref 5–15)
BUN: 17 mg/dL (ref 6–20)
CO2: 26 mmol/L (ref 22–32)
Calcium: 9.2 mg/dL (ref 8.9–10.3)
Chloride: 104 mmol/L (ref 98–111)
Creatinine, Ser: 1.24 mg/dL (ref 0.61–1.24)
GFR calc Af Amer: >60 mL/min (ref >60)
GFR calc non Af Amer: >60 mL/min (ref >60)
Glucose, Bld: 105 mg/dL — ABNORMAL HIGH (ref 70–99)
Potassium: 4.4 mmol/L (ref 3.5–5.1)
Sodium: 138 mmol/L (ref 135–145)
Total Bilirubin: 0.8 mg/dL (ref 0.3–1.2)
Total Protein: 7.6 g/dL (ref 6.5–8.1)

## 2018-09-07 LAB — ACETAMINOPHEN LEVEL: Acetaminophen (Tylenol), Serum: <10 ug/mL — ABNORMAL LOW (ref 10–30)

## 2018-09-07 LAB — ETHANOL: Alcohol, Ethyl (B): <10 mg/dL (ref <10)

## 2018-09-07 LAB — SALICYLATE LEVEL: Salicylate Lvl: <7 mg/dL (ref 2.8–30.0)

## 2018-09-07 NOTE — Discharge Instructions (Signed)
For your mental health needs, you are advised to follow up with [facility].  Call them at your earliest opportunity to schedule an intake appointment:       [facility]      [address]      [place], [place] [address]      [phone]      Crisis number: [phone]  To help you maintain a sober lifestyle, a substance abuse treatment program may be beneficial to you.  Contact one of the following facilities at your earliest opportunity to ask about enrolling:  RESIDENTIAL PROGRAMS:       [facility]      [address] [place], [place] [address]      [phone]       [facility]      [address]      [place], [place] [address]      [phone]  OUTPATIENT PROGRAMS:  To help you maintain a sober lifestyle, a substance abuse treatment program may be beneficial to you.  Contact [facility] at your earliest opportunity to ask about enrolling in their program:       [facility]      [address], [place] [address]      [phone]      New patients must call to schedule an intake appointment Mondays, Wednesdays and Fridays, 1:00 pm - 3:00 pm  For your shelter needs, contact the following service providers:       [facility] ([facility])      [address] [place], [place] [address]      [phone]       [facility]      [address]      [place], [place] [address]      [phone]      If you decide to seek shelter here, you must go directly from the Emergency Department.  For day shelter and other supportive services for the homeless, contact the [facility] [facility]):       [facility]      [address]      [place], [place] [address]      [phone]  For transitional housing, Pension scheme manager.  They provide longer term housing than a shelter, but there is an application process:       Holiday representative of  [employer]      [address], [place] [address]      [phone]

## 2018-09-07 NOTE — Discharge Instructions (Signed)
°  For your mental health needs, Please follow up at:  [facility] [address], [place]  [phone]  For your housing needs, Please follow up at:   [facility] Address: [address] [place], [place] [address] Phone: [phone]  [facility] [address] [place], [place] [address]  [phone]

## 2018-09-07 NOTE — ED Notes (Signed)
Janice Coffin provided update to this RN. Pt will be seen by peer support and d/c. Pt is cleared for d/c. Pt will be provided info about [facility] and homeless shelters

## 2018-09-07 NOTE — BH Assessment (Signed)
[facility] Assessment Progress Note  Per Juanetta Beets, DO, this pt does not require psychiatric hospitalization at this time.  Pt is to be discharged from [facility] with referral information for [facility], and with substance abuse treatment resources.  He is also to be provided with information regarding area supportive services for the homeless.  These resources have been included in pt's discharge instructions.  Pt would also benefit from seeing Peer Support Specialists; they will be asked to speak to pt.  Pt's nurse has been notified.  Doylene Canning, MA Triage Specialist [phone]

## 2018-09-07 NOTE — BHH Suicide Risk Assessment (Cosign Needed)
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 rate 16, height 6' (1.829 m), weight 90.7 kg, SpO2 99 %.Body mass index is 27.12 kg/m.  General Appearance: Casual  Eye Contact::  Good  Speech:  Clear and Coherent and Normal Rate409  Volume:  Normal  Mood:  Anxious  Affect:  Congruent  Thought Process:  Coherent, Linear and Descriptions of Associations: Intact  Orientation:  Full (Time, Place, and Person)  Thought Content:  Logical  Suicidal Thoughts:  No  Homicidal Thoughts:  No  Memory:  Immediate;   Good Recent;   Good Remote;   Fair  Judgement:  Poor  Insight:  Present  Psychomotor Activity:  Normal  Concentration:  Good  Recall:  Good  Fund of Knowledge:Good  Language: Good  Akathisia:  No  Handed:  Right  AIMS (if indicated):     Assets:  Communication Skills Housing Social Support  Sleep:     Cognition: WNL  ADL's:  Intact   Mental Status Per Nursing Assessment::   On Admission:    Pt was seen and chart reviewed with treatment team and Dr Sharma Covert.  Pt denies suicidal/homicidal ideation, denies auditory/visual hallucinations and does not appear to be responding to internal stimuli. Pt is well known to this emergency room system. Pt has had 5 ed visits and one [facility] admission in the past 6 months. Pt was admitted to [facility] from 3/14 to 3/18 and discharged with prescriptions and follow up instructions. Pt did not follow up and has not gotten his mantel health medications. Pt  panhandles for money to buy cocaine. Pt's UDS positive for cocaine, BAL negative. Pt often states he is drinking large amounts of alcohol daily but his BAL is always negative on admission. Pt is requesting to be placed inpatient and then go to rehab. Pt was educated that he was just inpatient and he needs to follow up with outpatient resources. Pt will be seen by Peer Support for assistance with substance abuse resources in the community.  Pt will be provided with outpatient resources for follow up for medication management.  Pt is psychiatrically clear.   Demographic Factors:  Male, Caucasian, Low socioeconomic status and Unemployed  Loss Factors: Financial problems/change in socioeconomic status  Historical Factors: Family history of mental illness or substance abuse  Risk Reduction Factors:   Sense of responsibility to family  Continued Clinical Symptoms:  Alcohol/Substance Abuse/Dependencies  Cognitive Features That Contribute To Risk:  Closed-mindedness    Suicide Risk:  Minimal: No identifiable suicidal ideation.  Patients presenting with no risk factors but with morbid ruminations; may be classified as minimal risk based on the severity of the depressive symptoms   Plan Of Care/Follow-up recommendations:  Activity:  as tolerated Diet:  Heart healthy  Laveda Abbe, NP 09/07/2018, 11:11 AM

## 2018-09-07 NOTE — BH Assessment (Addendum)
Tele Assessment Note   Patient Name: Logan CliffJames Oliver Charette Jr. MRN: [id number] Referring Physician: Dr. Dione Boozeavid Glick, MD Location of Patient: [facility] Location of Provider: Behavioral Health TTS Department  Logan IonJames Oliver [name] is a 53 y.o. male who [facility] due to pt reportedly jumping in front of traffic. Pt states that he and his wife (whom he has been separated from, though they get together every so often and then she leaves him again, causing him heartache) again spent time together and she again left him, which caused him much despair. He states he used $60 of crack cocaine and drank 1/2 of a fifth of liquor and then decided to end his life by jumping in front of cars. Clinician inquired as to what pt planned to do if after jumping in front of a car he didn't die, but he instead lost a limb or ended up brain damaged or paralyzed. Pt stated he had never thought of the possibility of that and that those thoughts "make it real."  Pt states he was experiencing SI. He denies HI, NSSIB, involvement with the legal system, and states one of his friends has a pistol but that the friend locked it up from him once he stated he was suicidal. Pt states he sees his deceased mother at times but stated he does not hear her speak; he made no mention of any other AVH. Pt denied any other SA. Pt is not currently seeing a therapist nor a psychiatrist.  Pt states [facility]. He states that, at that time, he was offered to go to rehab but that he turned it down. Pt states that he now knows that was a mistake and that he should have taken the offer; pt states he would like the opportunity to go to rehab.   Pt states his greatest support is his friend who is letting him stay in his camper. Pt states he does not want clinician contacting this friend for collateral as he doesn't want his friend upset with him for getting him  involved.  Pt is oriented x4. His recent and remote memory is intact. Pt was cooperative throughout the assessment process and, from clinician's memory from prior assessments with pt, he was more forthright with information and more realistic than he has been in previous assessments. Pt's insight, judgement, and impulse control is impaired at this time.   Diagnosis: F31.9, Bipolar I disorder, Current or most recent episode unspecified   Past Medical History:  Past Medical History:  Diagnosis Date  . Cocaine dependence with cocaine-induced mood disorder (HCC)   . Depression   . 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 Schizoaffective disorder, bipolar type (HCC) 06/22/2017  . Suicide attempt [facility](HCC)     History reviewed. No pertinent surgical history.  Family History: No family history on file.  Social History:  reports that he has quit smoking. He has never used smokeless tobacco. He reports current alcohol use. He reports current drug use. Drugs: Cocaine and "Crack" cocaine.  Additional Social History:  Alcohol / Drug Use Pain Medications: Please see MAR Prescriptions: Please see MAR Over the Counter: Please see MAR History of alcohol / drug use?: Yes Longest period of sobriety (when/how long): Unknown Substance #1 Name of Substance 1: Cocaine (crack) 1 - Age of First Use: 20 1 - Amount (size/oz): $60 1 - Frequency: Daily 1 -  Duration: Unknown 1 - Last Use / Amount: Today (09/06/2018) Substance #2 Name of Substance 2: EtOH 2 - Age of First Use: 9 2 - Amount (size/oz): 1/2 of a fifth 2 - Frequency: Daily 2 - Duration: Unknown 2 - Last Use / Amount: Today (09/06/2018)  CIWA: CIWA-Ar BP: 113/70 Pulse Rate: 88 COWS:    Allergies:  Allergies  Allergen Reactions  . Asa [Aspirin] Hives  . Nicotine Other (See Comments)    migraine  . Paxil [Paroxetine Hcl] Other (See Comments)    shaking  . Tylenol [Acetaminophen] Hives     Home Medications: (Not in a hospital admission)   OB/GYN Status:  No LMP for male patient.  General Assessment Data Location of Assessment: [facility] TTS Assessment: In system Is this a Tele or Face-to-Face Assessment?: Tele Assessment Is this an Initial Assessment or a Re-assessment for this encounter?: Initial Assessment Patient Accompanied by:: N/A Language Other than English: No Living Arrangements: Other (Comment)(Pt lives alone in a friend's camper) What gender do you identify as?: Male Marital status: Separated Maiden name: Marcantel Pregnancy Status: No Living Arrangements: Alone Can pt return to current living arrangement?: Yes Admission Status: Voluntary Is patient capable of signing voluntary admission?: Yes Referral Source: Self/Family/Friend Insurance type: None     Crisis Care Plan Living Arrangements: Alone Legal Guardian: (N/A) Name of Psychiatrist: None Name of Therapist: None  Education Status Is patient currently in school?: No Is the patient employed, unemployed or receiving disability?: Unemployed  Risk to self with the past 6 months Suicidal Ideation: Yes-Currently Present Has patient been a risk to self within the past 6 months prior to admission? : Yes Suicidal Intent: Yes-Currently Present Has patient had any suicidal intent within the past 6 months prior to admission? : Yes Is patient at risk for suicide?: Yes Suicidal Plan?: Yes-Currently Present Has patient had any suicidal plan within the past 6 months prior to admission? : Yes Specify Current Suicidal Plan: Pt plans to walk out into traffic Access to Means: Yes Specify Access to Suicidal Means: Pt has access to traffic What has been your use of drugs/alcohol within the last 12 months?: Pt acknowledges daily cocaine and EtOH use Previous Attempts/Gestures: Yes How many times?: 30 Other Self Harm Risks: Ongoing SA Triggers for Past Attempts: Spouse contact Intentional Self Injurious  Behavior: None Comment - Self Injurious Behavior: None noted Family Suicide History: Unknown Recent stressful life event(s): Conflict (Comment)(Reportedly keeps getting together w/ wife & she leaves again) Persecutory voices/beliefs?: No Depression: Yes Depression Symptoms: Feeling worthless/self pity, Guilt Substance abuse history and/or treatment for substance abuse?: Yes Suicide prevention information given to non-admitted patients: Not applicable  Risk to Others within the past 6 months Homicidal Ideation: No Does patient have any lifetime risk of violence toward others beyond the six months prior to admission? : No Thoughts of Harm to Others: No Comment - Thoughts of Harm to Others: None noted Current Homicidal Intent: No Current Homicidal Plan: No Describe Current Homicidal Plan: None noted Access to Homicidal Means: No Describe Access to Homicidal Means: None noted Identified Victim: None noted History of harm to others?: No Assessment of Violence: On admission Violent Behavior Description: None noted Does patient have access to weapons?: Yes (Comment)(Pt's friend has a pistol; friend told pt he locked it up) Criminal Charges Pending?: No Does patient have a court date: No Is patient on probation?: No  Psychosis Hallucinations: Visual(Pt reports he sometimes sees his dead mother) Delusions: None noted  Mental  Status Report Appearance/Hygiene: In scrubs Eye Contact: Good Motor Activity: Freedom of movement(Pt is lying in his hospital bed) Speech: Logical/coherent Level of Consciousness: Alert Mood: Ambivalent Affect: Appropriate to circumstance Anxiety Level: None Thought Processes: Coherent, Relevant Judgement: Partial Orientation: Person, Place, Time, Situation Obsessive Compulsive Thoughts/Behaviors: None  Cognitive Functioning Concentration: Normal Memory: Recent Intact, Remote Intact Is patient IDD: No Insight: Good Impulse Control: Poor Appetite:  Fair Have you had any weight changes? : No Change Amount of the weight change? (lbs): (N/A) Sleep: No Change Total Hours of Sleep: 8 Vegetative Symptoms: None  ADLScreening [facility] Assessment Services) Patient's cognitive ability adequate to safely complete daily activities?: Yes Patient able to express need for assistance with ADLs?: Yes Independently performs ADLs?: Yes (appropriate for developmental age)  Prior Inpatient Therapy Prior Inpatient Therapy: Yes Prior Therapy Dates: Multiple; most recent is 2020 Prior Therapy Facilty/Provider(s): [facility] is [facility] [place], [facility] Reason for Treatment: MH Concerns  Prior Outpatient Therapy Prior Outpatient Therapy: Yes Prior Therapy Dates: 2019 for 6 months Prior Therapy Facilty/Provider(s): [facility]; received psychiatry and therapy services Reason for Treatment: MH Concerns Does patient have an ACCT team?: No Does patient have Intensive In-House Services?  : No Does patient have [facility] services? : No Does patient have P4CC services?: No  ADL Screening (condition at time of admission) Patient's cognitive ability adequate to safely complete daily activities?: Yes Is the patient deaf or have difficulty hearing?: No Does the patient have difficulty seeing, even when wearing glasses/contacts?: No Does the patient have difficulty concentrating, remembering, or making decisions?: No Patient able to express need for assistance with ADLs?: Yes Does the patient have difficulty dressing or bathing?: No Independently performs ADLs?: Yes (appropriate for developmental age) Does the patient have difficulty walking or climbing stairs?: No Weakness of Legs: None Weakness of Arms/Hands: None  Home Assistive Devices/Equipment Home Assistive Devices/Equipment: None  Therapy Consults (therapy consults require a physician order) PT Evaluation Needed: No OT Evalulation Needed: No SLP Evaluation Needed:  No Abuse/Neglect Assessment (Assessment to be complete while patient is alone) Abuse/Neglect Assessment Can Be Completed: Unable to assess, patient is non-responsive or altered mental status Values / Beliefs Cultural Requests During Hospitalization: None Spiritual Requests During Hospitalization: None Consults Spiritual Care Consult Needed: No Social Work Consult Needed: No Merchant navy officer (For Healthcare) Does Patient Have a Medical Advance Directive?: No Would patient like information on creating a medical advance directive?: No - Patient declined        Disposition: Donell Sievert, PA, reviewed pt's chart and information and determined pt should be observed overnight for safety and stability due to pt's SI with a plan. Pt's charge nurse, Recruitment consultant, was provided this information at 0120.  Disposition Initial Assessment Completed for this Encounter: Yes Patient referred to: Other (Comment)(Pt will be observed overnight for safety and stability)  This service was provided via telemedicine using a 2-way, interactive audio and video technology.  Names of all persons participating in this telemedicine service and their role in this encounter. Name: Dquan Cortopassi Role: Patient  Name: Duard Brady Role: Clinician    Ralph Dowdy 09/07/2018 1:29 AM

## 2018-09-07 NOTE — ED Notes (Signed)
Per pt, he does NOT have a contact number.

## 2018-09-07 NOTE — BH Assessment (Deleted)
Tele Assessment Note   Patient Name: Logan [name] MRN: [id number] Referring Physician: Dr. Dione Booze, MD Location of Patient:  Location of Provider: Behavioral Health TTS Department  [name] Taesean Stadtlander. is an 53 y.o. male.   Diagnosis:   Past Medical History:  Past Medical History:  Diagnosis Date  . Cocaine dependence with cocaine-induced mood disorder (HCC)   . Depression   . 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 Schizoaffective disorder, bipolar type (HCC) 06/22/2017  . Suicide attempt [facility])     History reviewed. No pertinent surgical history.  Family History: No family history on file.  Social History:  reports that he has quit smoking. He has never used smokeless tobacco. He reports current alcohol use. He reports current drug use. Drugs: Cocaine and "Crack" cocaine.  Additional Social History:     CIWA: CIWA-Ar BP: 113/70 Pulse Rate: 88 COWS:    Allergies:  Allergies  Allergen Reactions  . Asa [Aspirin] Hives  . Nicotine Other (See Comments)    migraine  . Paxil [Paroxetine Hcl] Other (See Comments)    shaking  . Tylenol [Acetaminophen] Hives    Home Medications: (Not in a hospital admission)   OB/GYN Status:  No LMP for male patient.                                                    Advance Directives (For Healthcare) Does Patient Have a Medical Advance Directive?: No Would patient like information on creating a medical advance directive?: No - Patient declined          Disposition:     This service was provided via telemedicine using a 2-way, interactive audio and video technology.  Names of all persons participating in this telemedicine service and their role in this encounter. Name: Cauy Bussell Role: Patient  Name: Duard Brady Role: Clinician    Ralph Dowdy 09/07/2018 12:56 AM

## 2018-09-10 ENCOUNTER — Ambulatory Visit (HOSPITAL_COMMUNITY)
Admission: RE | Admit: 2018-09-10 | Discharge: 2018-09-10 | Disposition: A | Discharge status: Home / Self Care | Payer: Self-pay | Attending: Psychiatry | Admitting: Psychiatry

## 2018-09-10 DIAGNOSIS — Z7289 Other problems related to lifestyle: Secondary | ICD-10-CM | POA: Insufficient documentation

## 2018-09-10 DIAGNOSIS — R45851 Suicidal ideations: Secondary | ICD-10-CM | POA: Insufficient documentation

## 2018-09-10 DIAGNOSIS — F329 Major depressive disorder, single episode, unspecified: Secondary | ICD-10-CM | POA: Insufficient documentation

## 2018-09-10 MED ORDER — QUETIAPINE FUMARATE 50 MG PO TABS
50.0000 mg | ORAL_TABLET | Freq: Every day | ORAL | Status: DC
  Filled 2018-09-10 (×2): qty 1

## 2018-09-10 MED ORDER — QUETIAPINE FUMARATE 100 MG PO TABS
100.0000 mg | ORAL_TABLET | Freq: Every day | ORAL | Status: DC
  Filled 2018-09-10 (×2): qty 1

## 2018-09-10 MED ORDER — BUSPIRONE HCL 15 MG PO TABS
15.0000 mg | ORAL_TABLET | Freq: Two times a day (BID) | ORAL | Status: DC
  Filled 2018-09-10 (×4): qty 1

## 2018-09-10 MED ORDER — CITALOPRAM HYDROBROMIDE 10 MG PO TABS
10.0000 mg | ORAL_TABLET | Freq: Every day | ORAL | Status: DC
  Filled 2018-09-10 (×2): qty 1
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
## 2018-09-10 NOTE — H&P (Signed)
Behavioral Health Medical Screening Exam  Logan Hudson Logan [name] is an 53 y.o. male.  Total Time spent with patient: 20 minutes  Psychiatric Specialty Exam: Physical Exam  Nursing note and vitals reviewed. Constitutional: He is oriented to person, place, and time. He appears well-developed and well-nourished.  Respiratory: Effort normal.  Musculoskeletal: Normal range of motion.  Neurological: He is alert and oriented to person, place, and time.  Skin: Skin is warm.    Review of Systems  Constitutional: Negative.   HENT: Negative.   Eyes: Negative.   Respiratory: Negative.   Cardiovascular: Negative.   Gastrointestinal: Negative.   Genitourinary: Negative.   Musculoskeletal: Negative.   Skin: Negative.   Neurological: Negative.   Endo/Heme/Allergies: Negative.   Psychiatric/Behavioral: Positive for depression and substance abuse.    Blood pressure 111/63, pulse (!) 116, temperature 97.6 F (36.4 C), temperature source Oral, resp. rate 18, SpO2 98 %.There is no height or weight on file to calculate BMI.  General Appearance: Casual  Eye Contact:  Good  Speech:  Clear and Coherent and Normal Rate  Volume:  Normal  Mood:  Depressed  Affect:  Congruent  Thought Process:  Coherent and Descriptions of Associations: Intact  Orientation:  Full (Time, Place, and Person)  Thought Content:  WDL  Suicidal Thoughts:  Stated Si on initial evaluation, but denies any currently  Homicidal Thoughts:  No  Memory:  Immediate;   Good Recent;   Good Remote;   Good  Judgement:  Fair  Insight:  Fair  Psychomotor Activity:  Normal  Concentration: Concentration: Good and Attention Span: Good  Recall:  Good  Fund of Knowledge:Good  Language: Good  Akathisia:  No  Handed:  Right  AIMS (if indicated):     Assets:  Communication Skills Desire for Improvement Financial Resources/Insurance Housing Resilience Social Support  Sleep:       Musculoskeletal: Strength & Muscle Tone: within  normal limits Gait & Station: normal Patient leans: N/A  Blood pressure 111/63, pulse (!) 116, temperature 97.6 F (36.4 C), temperature source Oral, resp. rate 18, SpO2 98 %.  Recommendations:  Based on my evaluation the patient does not appear to have an emergency medical condition.  Gerlene Burdock Aili Casillas, FNP 09/10/2018, 1:31 PM

## 2020-03-25 IMAGING — DX DG CHEST 2V
2 series · 2 of 2 positions shown · non-contrast
Comparison: 10/15/2017 chest radiograph.

CLINICAL DATA: Chest pain

EXAM:
CHEST - 2 VIEW

[w chest pa]
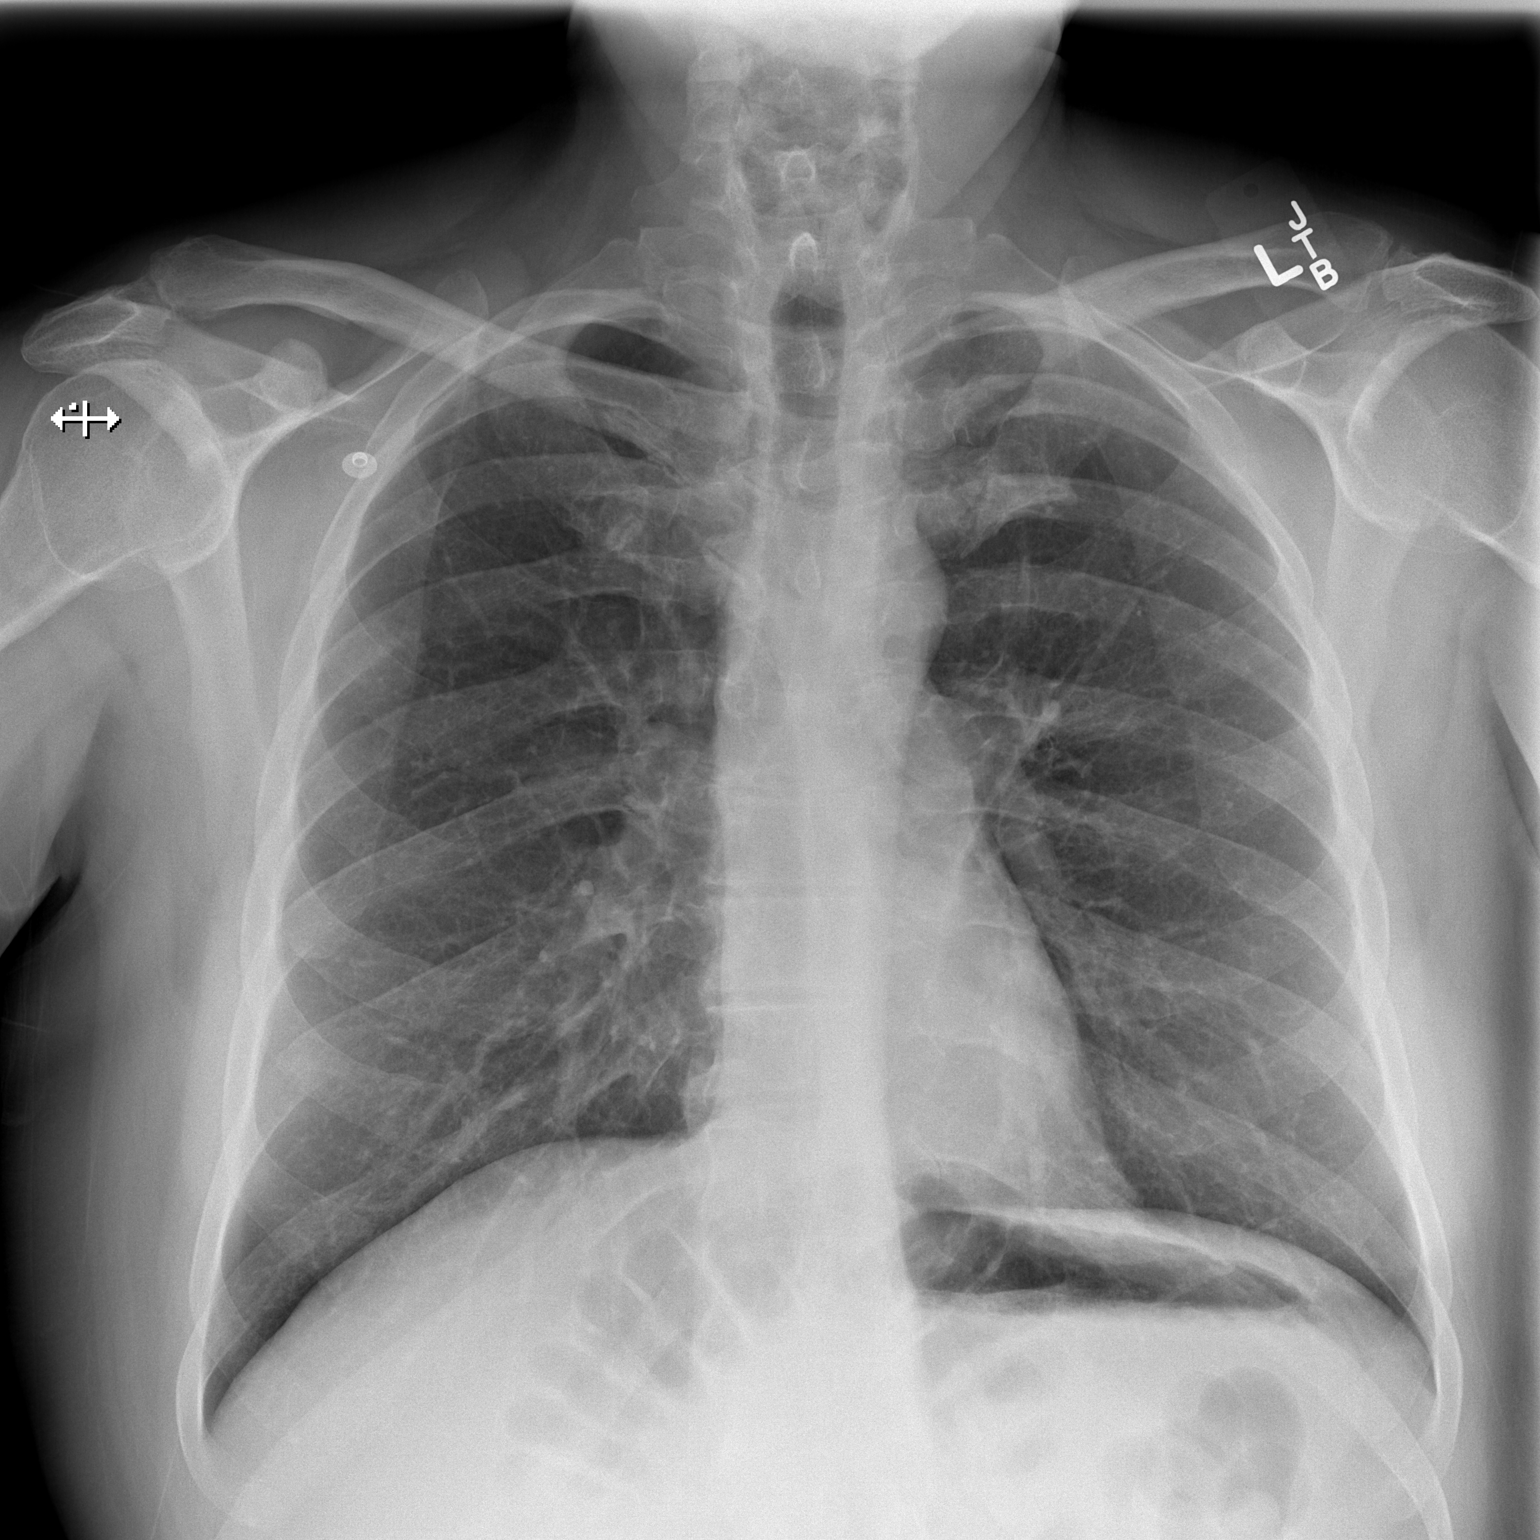

[w chest lat]
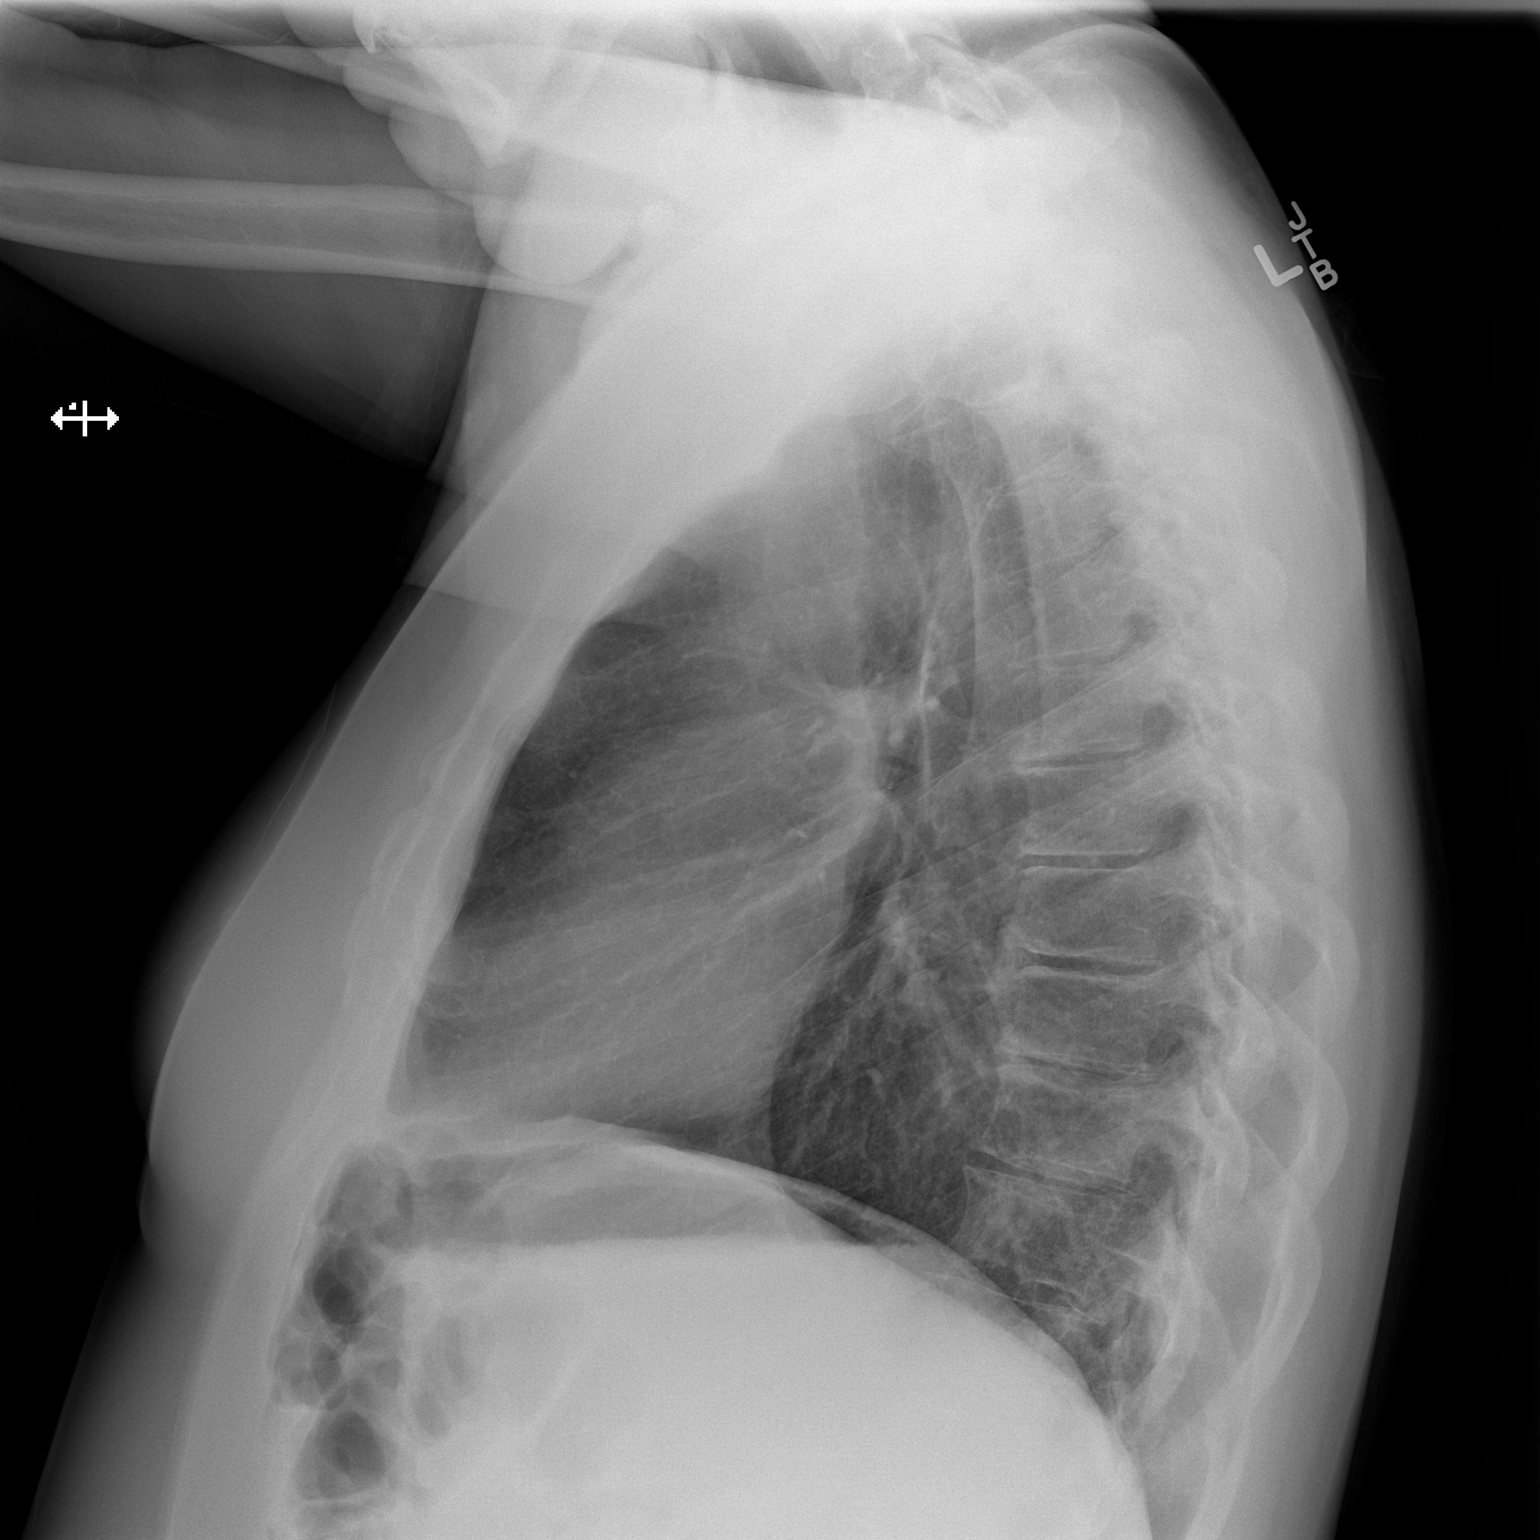

[2 of 2 positions shown; findings below may reference images not displayed]

FINDINGS: Stable cardiomediastinal silhouette with normal heart size. No
pneumothorax. No pleural effusion. Lungs appear clear, with no acute
consolidative airspace disease and no pulmonary edema.
IMPRESSION: No active cardiopulmonary disease.
